# Patient Record
Sex: Male | Born: 1980 | Race: White | Hispanic: No | Smoking: Current every day smoker
Health system: Southern US, Community
[De-identification: ages and names within clinical notes are randomized; demographics above are authoritative.]

## PROBLEM LIST (undated history)

## (undated) DIAGNOSIS — K5792 Diverticulitis of intestine, part unspecified, without perforation or abscess without bleeding: Secondary | ICD-10-CM

## (undated) DIAGNOSIS — F1910 Other psychoactive substance abuse, uncomplicated: Secondary | ICD-10-CM

## (undated) DIAGNOSIS — B1920 Unspecified viral hepatitis C without hepatic coma: Secondary | ICD-10-CM

## (undated) DIAGNOSIS — K529 Noninfective gastroenteritis and colitis, unspecified: Secondary | ICD-10-CM

## (undated) DIAGNOSIS — F17200 Nicotine dependence, unspecified, uncomplicated: Secondary | ICD-10-CM

## (undated) DIAGNOSIS — F329 Major depressive disorder, single episode, unspecified: Secondary | ICD-10-CM

## (undated) DIAGNOSIS — Z22322 Carrier or suspected carrier of Methicillin resistant Staphylococcus aureus: Secondary | ICD-10-CM

## (undated) DIAGNOSIS — F419 Anxiety disorder, unspecified: Secondary | ICD-10-CM

## (undated) DIAGNOSIS — F32A Depression, unspecified: Secondary | ICD-10-CM

## (undated) DIAGNOSIS — Z221 Carrier of other intestinal infectious diseases: Secondary | ICD-10-CM

## (undated) HISTORY — PX: COLONOSCOPY: SHX174

---

## 2011-06-18 ENCOUNTER — Emergency Department (HOSPITAL_COMMUNITY)
Admission: EM | Admit: 2011-06-18 | Discharge: 2011-06-18 | Disposition: A | Discharge status: Home / Self Care | Payer: Self-pay | Attending: Emergency Medicine | Admitting: Emergency Medicine

## 2011-06-18 DIAGNOSIS — K5289 Other specified noninfective gastroenteritis and colitis: Secondary | ICD-10-CM | POA: Insufficient documentation

## 2011-06-18 DIAGNOSIS — R1032 Left lower quadrant pain: Secondary | ICD-10-CM | POA: Insufficient documentation

## 2011-06-18 DIAGNOSIS — Z8619 Personal history of other infectious and parasitic diseases: Secondary | ICD-10-CM | POA: Insufficient documentation

## 2011-06-18 LAB — DIFFERENTIAL
Basophils Absolute: 0.1 10*3/uL (ref 0.0–0.1)
Lymphocytes Relative: 24 % (ref 12–46)
Monocytes Absolute: 0.6 10*3/uL (ref 0.1–1.0)
Monocytes Relative: 7 % (ref 3–12)
Neutro Abs: 6 10*3/uL (ref 1.7–7.7)
Neutrophils Relative %: 64 % (ref 43–77)

## 2011-06-18 LAB — CBC
HCT: 40.4 % (ref 39.0–52.0)
Hemoglobin: 14.1 g/dL (ref 13.0–17.0)
MCHC: 34.9 g/dL (ref 30.0–36.0)
RBC: 4.71 MIL/uL (ref 4.22–5.81)

## 2011-06-18 LAB — BASIC METABOLIC PANEL
CO2: 28 mEq/L (ref 19–32)
Calcium: 9.2 mg/dL (ref 8.4–10.5)
Chloride: 104 mEq/L (ref 96–112)
Glucose, Bld: 97 mg/dL (ref 70–99)
Sodium: 139 mEq/L (ref 135–145)

## 2011-06-28 ENCOUNTER — Emergency Department: Payer: Self-pay | Admitting: Emergency Medicine

## 2011-07-03 ENCOUNTER — Emergency Department (HOSPITAL_COMMUNITY): Payer: Self-pay

## 2011-07-03 ENCOUNTER — Emergency Department (HOSPITAL_COMMUNITY)
Admission: EM | Admit: 2011-07-03 | Discharge: 2011-07-03 | Disposition: A | Discharge status: Home / Self Care | Payer: Self-pay | Attending: Emergency Medicine | Admitting: Emergency Medicine

## 2011-07-03 DIAGNOSIS — K921 Melena: Secondary | ICD-10-CM | POA: Insufficient documentation

## 2011-07-03 DIAGNOSIS — K5289 Other specified noninfective gastroenteritis and colitis: Secondary | ICD-10-CM | POA: Insufficient documentation

## 2011-07-03 DIAGNOSIS — R112 Nausea with vomiting, unspecified: Secondary | ICD-10-CM | POA: Insufficient documentation

## 2011-07-03 DIAGNOSIS — R197 Diarrhea, unspecified: Secondary | ICD-10-CM | POA: Insufficient documentation

## 2011-07-03 DIAGNOSIS — R10814 Left lower quadrant abdominal tenderness: Secondary | ICD-10-CM | POA: Insufficient documentation

## 2011-07-03 DIAGNOSIS — Z86718 Personal history of other venous thrombosis and embolism: Secondary | ICD-10-CM | POA: Insufficient documentation

## 2011-07-03 DIAGNOSIS — R1032 Left lower quadrant pain: Secondary | ICD-10-CM | POA: Insufficient documentation

## 2011-07-03 DIAGNOSIS — R509 Fever, unspecified: Secondary | ICD-10-CM | POA: Insufficient documentation

## 2011-07-03 LAB — CBC
HCT: 41.6 % (ref 39.0–52.0)
Hemoglobin: 15.1 g/dL (ref 13.0–17.0)
MCH: 31.6 pg (ref 26.0–34.0)
MCHC: 36.3 g/dL — ABNORMAL HIGH (ref 30.0–36.0)
MCV: 87 fL (ref 78.0–100.0)
RBC: 4.78 MIL/uL (ref 4.22–5.81)

## 2011-07-03 LAB — BASIC METABOLIC PANEL
BUN: 10 mg/dL (ref 6–23)
CO2: 26 mEq/L (ref 19–32)
Calcium: 9.4 mg/dL (ref 8.4–10.5)
Creatinine, Ser: 0.62 mg/dL (ref 0.50–1.35)
GFR calc non Af Amer: >60 mL/min (ref >60)
Glucose, Bld: 86 mg/dL (ref 70–99)

## 2011-07-03 LAB — URINALYSIS, ROUTINE W REFLEX MICROSCOPIC
Bilirubin Urine: NEGATIVE
Glucose, UA: NEGATIVE mg/dL
Hgb urine dipstick: NEGATIVE
Protein, ur: NEGATIVE mg/dL
Urobilinogen, UA: 0.2 mg/dL (ref 0.0–1.0)

## 2011-07-03 LAB — DIFFERENTIAL
Basophils Relative: 0 % (ref 0–1)
Lymphocytes Relative: 24 % (ref 12–46)
Lymphs Abs: 2.3 10*3/uL (ref 0.7–4.0)
Monocytes Absolute: 0.8 10*3/uL (ref 0.1–1.0)
Monocytes Relative: 9 % (ref 3–12)
Neutro Abs: 6.4 10*3/uL (ref 1.7–7.7)
Neutrophils Relative %: 65 % (ref 43–77)

## 2011-07-05 ENCOUNTER — Inpatient Hospital Stay (HOSPITAL_COMMUNITY)
Admission: EM | Admit: 2011-07-05 | Discharge: 2011-07-09 | DRG: 373 | Disposition: A | Discharge status: Home / Self Care | Payer: Self-pay | Attending: Internal Medicine | Admitting: Internal Medicine

## 2011-07-05 DIAGNOSIS — Z8619 Personal history of other infectious and parasitic diseases: Secondary | ICD-10-CM

## 2011-07-05 DIAGNOSIS — A0472 Enterocolitis due to Clostridium difficile, not specified as recurrent: Principal | ICD-10-CM | POA: Diagnosis present

## 2011-07-05 DIAGNOSIS — F17200 Nicotine dependence, unspecified, uncomplicated: Secondary | ICD-10-CM | POA: Diagnosis present

## 2011-07-05 DIAGNOSIS — R112 Nausea with vomiting, unspecified: Secondary | ICD-10-CM | POA: Diagnosis present

## 2011-07-05 LAB — DIFFERENTIAL
Basophils Relative: 1 % (ref 0–1)
Eosinophils Absolute: 0.3 10*3/uL (ref 0.0–0.7)
Lymphs Abs: 2 10*3/uL (ref 0.7–4.0)
Monocytes Absolute: 0.4 10*3/uL (ref 0.1–1.0)
Monocytes Relative: 7 % (ref 3–12)
Neutrophils Relative %: 58 % (ref 43–77)

## 2011-07-05 LAB — COMPREHENSIVE METABOLIC PANEL
CO2: 26 mEq/L (ref 19–32)
Calcium: 9.4 mg/dL (ref 8.4–10.5)
Creatinine, Ser: 0.63 mg/dL (ref 0.50–1.35)
GFR calc Af Amer: >60 mL/min (ref >60)
GFR calc non Af Amer: >60 mL/min (ref >60)
Glucose, Bld: 97 mg/dL (ref 70–99)
Total Protein: 6.1 g/dL (ref 6.0–8.3)

## 2011-07-05 LAB — CBC
Hemoglobin: 13.4 g/dL (ref 13.0–17.0)
MCH: 30.1 pg (ref 26.0–34.0)
MCHC: 33.9 g/dL (ref 30.0–36.0)
MCV: 88.8 fL (ref 78.0–100.0)
Platelets: 229 10*3/uL (ref 150–400)
RBC: 4.45 MIL/uL (ref 4.22–5.81)

## 2011-07-05 LAB — LIPASE, BLOOD: Lipase: 19 U/L (ref 11–59)

## 2011-07-06 LAB — CBC
HCT: 38.9 % — ABNORMAL LOW (ref 39.0–52.0)
MCHC: 32.9 g/dL (ref 30.0–36.0)
Platelets: 212 10*3/uL (ref 150–400)
RDW: 13.4 % (ref 11.5–15.5)

## 2011-07-06 LAB — BASIC METABOLIC PANEL
Calcium: 8.7 mg/dL (ref 8.4–10.5)
Creatinine, Ser: 0.79 mg/dL (ref 0.50–1.35)
GFR calc Af Amer: >60 mL/min (ref >60)
GFR calc non Af Amer: >60 mL/min (ref >60)
Sodium: 137 mEq/L (ref 135–145)

## 2011-07-06 LAB — CLOSTRIDIUM DIFFICILE BY PCR: Toxigenic C. Difficile by PCR: POSITIVE — AB

## 2011-07-06 LAB — HEMOGLOBIN AND HEMATOCRIT, BLOOD
HCT: 36.1 % — ABNORMAL LOW (ref 39.0–52.0)
Hemoglobin: 12.4 g/dL — ABNORMAL LOW (ref 13.0–17.0)

## 2011-07-06 LAB — SEDIMENTATION RATE: Sed Rate: 1 mm/hr (ref 0–16)

## 2011-07-07 LAB — CBC
MCH: 30.3 pg (ref 26.0–34.0)
Platelets: 202 10*3/uL (ref 150–400)
RBC: 4.22 MIL/uL (ref 4.22–5.81)

## 2011-07-07 LAB — DIFFERENTIAL
Basophils Absolute: 0 10*3/uL (ref 0.0–0.1)
Basophils Relative: 1 % (ref 0–1)
Eosinophils Absolute: 0.3 10*3/uL (ref 0.0–0.7)
Monocytes Relative: 9 % (ref 3–12)
Neutrophils Relative %: 58 % (ref 43–77)

## 2011-07-07 LAB — BASIC METABOLIC PANEL
CO2: 31 mEq/L (ref 19–32)
Calcium: 9 mg/dL (ref 8.4–10.5)
GFR calc non Af Amer: >60 mL/min (ref >60)
Sodium: 140 mEq/L (ref 135–145)

## 2011-07-08 ENCOUNTER — Inpatient Hospital Stay (HOSPITAL_COMMUNITY): Payer: Self-pay

## 2011-07-09 LAB — BASIC METABOLIC PANEL
BUN: 6 mg/dL (ref 6–23)
BUN: 8 mg/dL (ref 6–23)
CO2: 34 mEq/L — ABNORMAL HIGH (ref 19–32)
Calcium: 9.6 mg/dL (ref 8.4–10.5)
Calcium: 9.1 mg/dL (ref 8.4–10.5)
Chloride: 102 mEq/L (ref 96–112)
Creatinine, Ser: 0.8 mg/dL (ref 0.50–1.35)
GFR calc non Af Amer: >60 mL/min (ref >60)
Glucose, Bld: 87 mg/dL (ref 70–99)
Sodium: 139 mEq/L (ref 135–145)

## 2011-07-10 LAB — STOOL CULTURE

## 2011-07-13 ENCOUNTER — Inpatient Hospital Stay (HOSPITAL_COMMUNITY)
Admission: EM | Admit: 2011-07-13 | Discharge: 2011-07-16 | DRG: 373 | Disposition: A | Discharge status: Home / Self Care | Payer: Self-pay | Attending: Family Medicine | Admitting: Family Medicine

## 2011-07-13 DIAGNOSIS — B1920 Unspecified viral hepatitis C without hepatic coma: Secondary | ICD-10-CM | POA: Diagnosis present

## 2011-07-13 DIAGNOSIS — R1032 Left lower quadrant pain: Secondary | ICD-10-CM

## 2011-07-13 DIAGNOSIS — K644 Residual hemorrhoidal skin tags: Secondary | ICD-10-CM | POA: Diagnosis present

## 2011-07-13 DIAGNOSIS — K5730 Diverticulosis of large intestine without perforation or abscess without bleeding: Secondary | ICD-10-CM | POA: Diagnosis present

## 2011-07-13 DIAGNOSIS — F1910 Other psychoactive substance abuse, uncomplicated: Secondary | ICD-10-CM | POA: Diagnosis present

## 2011-07-13 DIAGNOSIS — F1210 Cannabis abuse, uncomplicated: Secondary | ICD-10-CM | POA: Diagnosis present

## 2011-07-13 DIAGNOSIS — R197 Diarrhea, unspecified: Secondary | ICD-10-CM

## 2011-07-13 DIAGNOSIS — A0472 Enterocolitis due to Clostridium difficile, not specified as recurrent: Principal | ICD-10-CM | POA: Diagnosis present

## 2011-07-13 DIAGNOSIS — B182 Chronic viral hepatitis C: Secondary | ICD-10-CM

## 2011-07-13 DIAGNOSIS — K029 Dental caries, unspecified: Secondary | ICD-10-CM | POA: Diagnosis present

## 2011-07-13 DIAGNOSIS — F1110 Opioid abuse, uncomplicated: Secondary | ICD-10-CM | POA: Diagnosis present

## 2011-07-13 DIAGNOSIS — F17200 Nicotine dependence, unspecified, uncomplicated: Secondary | ICD-10-CM | POA: Diagnosis present

## 2011-07-13 DIAGNOSIS — K5900 Constipation, unspecified: Secondary | ICD-10-CM | POA: Diagnosis present

## 2011-07-13 DIAGNOSIS — R933 Abnormal findings on diagnostic imaging of other parts of digestive tract: Secondary | ICD-10-CM

## 2011-07-13 DIAGNOSIS — Z765 Malingerer [conscious simulation]: Secondary | ICD-10-CM

## 2011-07-13 LAB — COMPREHENSIVE METABOLIC PANEL
ALT: 49 U/L (ref 0–53)
AST: 28 U/L (ref 0–37)
Albumin: 3.8 g/dL (ref 3.5–5.2)
Albumin: 3.1 g/dL — ABNORMAL LOW (ref 3.5–5.2)
Alkaline Phosphatase: 79 U/L (ref 39–117)
CO2: 26 mEq/L (ref 19–32)
Calcium: 9.6 mg/dL (ref 8.4–10.5)
Creatinine, Ser: 0.66 mg/dL (ref 0.50–1.35)
GFR calc Af Amer: >60 mL/min (ref >60)
GFR calc non Af Amer: >60 mL/min (ref >60)
Glucose, Bld: 102 mg/dL — ABNORMAL HIGH (ref 70–99)
Potassium: 3.3 mEq/L — ABNORMAL LOW (ref 3.5–5.1)
Sodium: 140 mEq/L (ref 135–145)
Total Protein: 6.9 g/dL (ref 6.0–8.3)
Total Protein: 5.7 g/dL — ABNORMAL LOW (ref 6.0–8.3)

## 2011-07-13 LAB — URINALYSIS, ROUTINE W REFLEX MICROSCOPIC
Hgb urine dipstick: NEGATIVE
Specific Gravity, Urine: 1.027 (ref 1.005–1.030)
Urobilinogen, UA: 0.2 mg/dL (ref 0.0–1.0)

## 2011-07-13 LAB — CBC
MCH: 31.4 pg (ref 26.0–34.0)
MCHC: 36 g/dL (ref 30.0–36.0)
MCV: 87.3 fL (ref 78.0–100.0)
Platelets: 265 10*3/uL (ref 150–400)
RDW: 13 % (ref 11.5–15.5)

## 2011-07-13 LAB — DIFFERENTIAL
Eosinophils Absolute: 0.2 10*3/uL (ref 0.0–0.7)
Eosinophils Relative: 2 % (ref 0–5)
Lymphs Abs: 1.6 10*3/uL (ref 0.7–4.0)
Monocytes Absolute: 0.5 10*3/uL (ref 0.1–1.0)
Monocytes Relative: 5 % (ref 3–12)

## 2011-07-14 ENCOUNTER — Other Ambulatory Visit: Payer: Self-pay | Admitting: Internal Medicine

## 2011-07-14 DIAGNOSIS — K921 Melena: Secondary | ICD-10-CM

## 2011-07-14 DIAGNOSIS — K5730 Diverticulosis of large intestine without perforation or abscess without bleeding: Secondary | ICD-10-CM

## 2011-07-14 LAB — CBC
HCT: 37 % — ABNORMAL LOW (ref 39.0–52.0)
Hemoglobin: 13 g/dL (ref 13.0–17.0)
MCH: 30.9 pg (ref 26.0–34.0)
MCHC: 35.1 g/dL (ref 30.0–36.0)

## 2011-07-14 LAB — COMPREHENSIVE METABOLIC PANEL
Alkaline Phosphatase: 79 U/L (ref 39–117)
BUN: 12 mg/dL (ref 6–23)
Calcium: 8.7 mg/dL (ref 8.4–10.5)
GFR calc Af Amer: >60 mL/min (ref >60)
Glucose, Bld: 102 mg/dL — ABNORMAL HIGH (ref 70–99)
Total Protein: 5.7 g/dL — ABNORMAL LOW (ref 6.0–8.3)

## 2011-07-14 LAB — TSH: TSH: 0.395 u[IU]/mL (ref 0.350–4.500)

## 2011-07-14 LAB — SEDIMENTATION RATE: Sed Rate: 1 mm/hr (ref 0–16)

## 2011-07-14 LAB — DRUGS OF ABUSE SCREEN W/O ALC, ROUTINE URINE
Amphetamine Screen, Ur: NEGATIVE
Barbiturate Quant, Ur: NEGATIVE
Cocaine Metabolites: NEGATIVE
Creatinine,U: 244.1 mg/dL
Propoxyphene: NEGATIVE

## 2011-07-14 LAB — URINALYSIS, DIPSTICK ONLY
Bilirubin Urine: NEGATIVE
Glucose, UA: NEGATIVE mg/dL
Hgb urine dipstick: NEGATIVE
Ketones, ur: NEGATIVE mg/dL
pH: 6 (ref 5.0–8.0)

## 2011-07-14 NOTE — Discharge Summary (Signed)
[name], [name]               ACCOUNT NO.:  [id number]  MEDICAL RECORD NO.:  [id number]  LOCATION:  1506                         FACILITY:  [facility]  PHYSICIAN:  Kela Millin, M.D.DATE OF BIRTH:  [birth year]  DATE OF ADMISSION:  07/05/2011 DATE OF DISCHARGE:  07/09/2011                        DISCHARGE SUMMARY - REFERRING   DISCHARGE DIAGNOSES: 1. Clostridium difficile colitis. 2. History of hepatitis C. 3. History of diverticulosis with diverticulitis in the past.  PROCEDURES AND STUDIES: 1. CT scan of the abdomen and pelvis on July 03, 2011 - mild mural     thickening of the sigmoid colon suggesting mild colitis.     Diverticulosis without diverticulitis.  Inflammatory versus     infectious.  Remainder of the colon appears normal. 2. Chest x-ray on July 08, 2011 - no significant abnormality     identified.  BRIEF HISTORY:  The patient is a 30 year old white male with above- listed medical problems, who presented with bloody diarrhea.  He reported that about 2 years ago he had had a colonoscopy in [place] and was diagnosed with diverticulosis and he did not have an episode of diverticulitis.  He stated that he had had bloody diarrhea for 3 to 4 days.  He admitted to generalized malaise and decreased appetite and admitted to abdominal cramping as well.  He reported that he had recently been started on Cipro and Flagyl because of the symptoms he was having.  In the ED, lab work included white cell count which was normal at 6.4.  His lipase was normal at 19, and he had had a CT scan of his abdomen and pelvis done on July 03, 2011 when he came to the ED and is revealed diverticulosis without diverticulitis, and mild colitis was noted.  He was admitted for further evaluation and management.  HOSPITAL COURSE: 1. Clostridium difficile colitis - upon admission, patient was     empirically started on Cipro and Flagyl.  Stool studies were     obtained and the PCR for C diff  came back positive.  The patient     was changed to oral Flagyl.  His pain gradually improved, and he     was started on a diet which was advanced as tolerated.  On his     initial attempt at taking solids, the patient had an episode of     increased pain and had some shortness of breath with that at that     time and his chest x-ray was done, which did not show any acute     infiltrates.  He also had a recurrence of his nausea and vomiting     then.  Following that, his diet was downgraded.  He has not had any     further bloody diarrhea, although he reported some loose stools     today - nonbloody.  He has been tolerating solids - low-residue     diet today with no further nausea or vomiting.  He will be     discharged on oral antibiotics at this time, and he is to follow up     outpatient.  He has remained afebrile, hemodynamically stable with  no leukocytosis.  Case management is assisting the patient with his     discharge medications. 2. His other chronic medical conditions remained stable during this     hospital stay.  DISCHARGE MEDICATIONS: 1. Flora-Q 1 capsule p.o. b.i.d. 2. Questran 1 packet p.o. daily p.r.n. 3. Flagyl 500 mg one p.o. q.8 h for 10 more days. 4. Vicodin 5/325 mg q.6 h p.r.n. 5. Phenergan 25 mg q.6 h p.r.n. as previously.  DISCONTINUED MEDICATIONS:  Cipro.  FOLLOWUP CARE:  Primary care physician - to be set up per case management in 1 to 2 weeks.  DISCHARGE CONDITION:  Improved/stable.     Kela Millin, M.D.     ACV/MEDQ  D:  07/09/2011  T:  07/09/2011  Job:  [id number]  Electronically Signed by Donnalee Curry M.D. on 07/14/2011 09:55:12 PM

## 2011-07-15 ENCOUNTER — Inpatient Hospital Stay (HOSPITAL_COMMUNITY): Payer: Self-pay

## 2011-07-15 DIAGNOSIS — R1032 Left lower quadrant pain: Secondary | ICD-10-CM

## 2011-07-15 DIAGNOSIS — R197 Diarrhea, unspecified: Secondary | ICD-10-CM

## 2011-07-15 DIAGNOSIS — R933 Abnormal findings on diagnostic imaging of other parts of digestive tract: Secondary | ICD-10-CM

## 2011-07-15 DIAGNOSIS — B182 Chronic viral hepatitis C: Secondary | ICD-10-CM

## 2011-07-16 LAB — HEPATITIS C VRS RNA DETECT BY PCR-QUAL: Hepatitis C Vrs RNA by PCR-Qual: POSITIVE — AB

## 2011-07-16 LAB — HCV RNA QUANT RFLX ULTRA OR GENOTYP: HCV Quantitative: 254000 IU/mL — ABNORMAL HIGH (ref <43)

## 2011-07-16 NOTE — Discharge Summary (Signed)
[name], [name]               ACCOUNT NO.:  [id number]  MEDICAL RECORD NO.:  [id number]  LOCATION:  5532                         FACILITY:  [facility]  PHYSICIAN:  Standley Dakins, MD   DATE OF BIRTH:  1981/08/27  DATE OF ADMISSION:  07/13/2011 DATE OF DISCHARGE:  07/16/2011                              DISCHARGE SUMMARY   PRIMARY CARE PHYSICIAN:  Dr. Andrey Campanile as [facility].  DISCHARGE DIAGNOSES: 1. Diverticulosis, without diverticulitis. 2. Clostridium difficile colitis. 3. Chronic constipation. 4. Chronic active nicotine dependence. 5. Urine drug screen positive for marijuana and opioid use. 6. Drug-seeking behaviors. 7. Status post flexible sigmoidoscopy. 8. Hepatitis C. 9. Nonspecific abdominal pain. 10.Significant Dental caries. 11.Self-neglect.   DISCHARGE MEDICATIONS: 1. Dicyclomine 10 mg 1 capsule by mouth 3 times daily as needed for     stomach cramps and spasms. 2. Omeprazole 20 mg one p.o. daily. 3. Metronidazole 500 mg 1 tablet p.o. every 8 hours for an additional     7 days. 4. Flora-Q intestinal capsules probiotics 1 p.o. twice daily. 5. Hydrocodone/acetaminophen 5 mg 1 tablet by mouth every 6 hours     p.r.n. severe cramping pain only 7-day supply.  No refills.  HOSPITAL COURSE:  Briefly, this patient is a 30 year old gentleman who presented to the emergency department complaining of persistent abdominal cramping, nausea, vomiting, and bloody diarrhea.  He had recently been discharged several days prior to this admission from [facility] with a similar complaint and discovered to have C. diff colitis and started on oral metronidazole.  He was discharged on oral metronidazole.  The patient was admitted into the hospital and was seen in consultation by Dr. Juanda Chance, our gastroenterologist.  She performed a flexible sigmoidoscopy on the patient and found no significant abnormalities other than an external hemorrhoid.  She did take some biopsies and  the biopsies were negative for morphologic features of idiopathic inflammatory bowel disease, microscopic colitis, or infectious colitis, and negative for dysplasia.  The patient also had a KUB to rule out ileus and that test was negative as well.  C-reactive protein, sed rate, and TSH studies were negative.  Stools cultures also negative.  The patient did have a positive C. diff test in the hospital.  All the other tests have been negative.  The gastroenterologist recommended that the patient be discharged with outpatient followup and to complete oral Flagyl for a full 10 days.  Also of note, the patient had marijuana on the drug screen in the urine and also the patient had positive opioids.  The patient did have some drug-seeking behaviors that was noted during this hospitalization.  He seemed to be requesting narcotic medications regularly and trying to manipulate the nurses for higher doses of narcotic medications.  When all of his tests came back negative and no significant abnormalities found, I discharged him to follow up as an outpatient and to complete his course of metronidazole. There was no evidence of inflammatory bowel disease discovered on this hospitalization and from the biopsies.  There was no evidence of C. diff colitis on the flex sigmoidoscopy, however he did have a positive stools test for C. diff.  Therefore, I would like for him  to continue the metronidazole until he has completed a full 10 days.  I have recommended that he have a high-fiber diet as well.  DISCHARGE CONDITION:  Stable.  Symptoms resolved.  DISPOSITION:  Discharged home with family.  ACTIVITY:  Ad lib.  DIET:  High-fiber diet recommended.  FOLLOWUP: 1. Follow up with [facility] on July 27, 2011 for an enrollment and     August 09, 2011 for an appointment with Dr. Andrey Campanile as scheduled. 2. I recommend that the patient follow up with a dentist as soon as     possible.  SPECIAL  INSTRUCTIONS: 1. I am recommending that the patient stop tobacco products and stop     using marijuana and recreational drugs. 2. Please follow a strict high-fiber diet and please take probiotics     regularly and complete antibiotics as prescribed.  This is very     important. 3. If the patient continues to have symptoms, I have recommended that     he make an appointment to see his primary care provider to have an     evaluation.  The patient verbalized understanding.  I evaluated the patient on the day of discharge.  He was evaluated on July 16, 2011.  PHYSICAL EXAMINATION:  GENERAL:  The patient was in bed, alert, in no distress, cooperative, and appropriate. VITAL SIGNS:  Reviewed.  Temperature 98.2, pulse 68, respirations 18, blood pressure 111/73, pulse ox 98% on room air.  GENERAL:  Well- developed, well-nourished male, awake, alert, in no distress. HEENT:  Normocephalic, atraumatic.  Mucous membranes moist.  Poor dentition. LUNGS:  Bilateral breath sounds clear to auscultation. CARDIAC:  Normal S1,S2 sounds without murmurs, rubs, or gallops. ABDOMEN:  Soft, nondistended, nontender, no masses palpated.  Bowel sounds present. EXTREMITIES:  No pretibial edema, cyanosis, or clubbing. NEUROLOGIC:  No focal deficits. SKIN:  No gross lesions noted.  LABORATORY DATA:  Stool cultures negative.  CRP negative.  C. diff test positive.  TSH 0.395.  Sed rate 1.  Sodium 141, potassium 3.8, chloride 107, CO2 of 26, BUN 12, creatinine 0.69, AST 25, ALT 48, calcium 8.7, white blood cell count 6.4, hemoglobin 13.0, hematocrit 37.0, platelet count 233.  IMPRESSION:  The patient is evaluated and determined to be stable for discharge home and followup as above.  I spent around 35 minutes preparing this patient's discharge, reviewing medical records, arranging followup, coordinating care with the case management team, and dictating a discharge summary.     Standley Dakins,  MD     CJ/MEDQ  D:  07/16/2011  T:  07/16/2011  Job:  [id number]  cc:   Dr. Andrey Campanile at [facility]  Electronically Signed by Standley Dakins  on 07/16/2011 09:31:18 PM

## 2011-07-16 NOTE — H&P (Signed)
NAMEMARQUES, [name]               ACCOUNT NO.:  [id number]  MEDICAL RECORD NO.:  [id number]  LOCATION:  [facility]                         FACILITY:  [facility]  PHYSICIAN:  Standley Dakins, MD   DATE OF BIRTH:  24-Oct-1981  DATE OF ADMISSION:  07/13/2011 DATE OF DISCHARGE:                             HISTORY & PHYSICAL   PRIMARY CARE PHYSICIAN:  Dr. Andrey Campanile, [facility].  CHIEF COMPLAINT:  Abdominal pain, nausea, vomiting, and bloody diarrhea.  HISTORY OF PRESENT ILLNESS:  This patient is a 29 year old gentleman, who was recently discharged from [facility] with a diagnosis of C. diff colitis, who apparently had been doing well up until 2 days ago when he reports he woke up feeling some malaise and left lower quadrant abdominal pain that progressively got worse.  The patient reports that he started to develop explosive diarrhea as well.  He reports that he had been taking his metronidazole medication.  He is reporting small amounts of blood in the stool.  The patient reports that a 23-month-old child that lives in his home has also had diarrhea.  The patient says that he had been taking oxycodone when discharged from the hospital because of pain.  The patient says when he was discharged he continued to have left lower quadrant abdominal pain.  The patient says he had not eaten since yesterday.  The patient says he is having significant nausea and vomiting and cannot keep any food or liquid down at this time.    The patient reports that he is having mucous in the stools.  He is having cramping intermittent abdominal pain that is described as 8/10 in severity.  He arrived to the emergency department today and reported that he vomited twice since coming to the emergency department.  He can no longer keep anything down.  The patient has not followed up with primary care provider since being discharged from the hospital.  His appointment had been scheduled for August.   The patient was treated in the emergency department, but no significant improvement in symptoms and hospital admission was requested.  PAST MEDICAL HISTORY: 1. Hepatitis C. 2. Diverticulosis with a history of diverticulitis. 3. Recent diagnosis of C. difficile colitis. 4. Chronic active nicotine dependence.  PAST SURGICAL HISTORY:  The patient reports colonoscopy 2 years ago in [place], [place] where he reports he was diagnosed with diverticulosis.  MEDICATIONS: 1. Questran Light 1 packet daily. 2. Promethazine 25 mg one p.o. every 6 hours p.r.n. nausea. 3. Metronidazole 500 mg one p.o. every 8 hours with instructions to     take for 10 days. 4. Hydrocodone acetaminophen 5/325 one to two tablets p.o. every 6     hours p.r.n. pain. 5. Flora-Q intestinal flora capsules one cap p.o. twice daily.  ALLERGIES:  No known drug allergies.  FAMILY HISTORY:  The patient reports a significant family history of cancer.  His mother had ovarian cancer.  His aunt has terminal lung cancer.  His maternal grandmother has hypertension.  SOCIAL HISTORY:  The patient reports that he is living locally in [place] with roommates.  He reports smoking one pack of cigarettes per day for 11 years.  Denies  alcohol and recreational drug use.  REVIEW OF SYSTEMS:  Significant for all of the positives in the HPI, otherwise all systems reviewed completely and reported as negative.  PHYSICAL EXAMINATION:  CURRENT VITAL SIGNS:  Temperature 98.6 oral, pulse 102, respirations 20, blood pressure 132/85, pulse ox 99% on room air. GENERAL: Well-developed, well-nourished male, thin, lying in the bed. He is in no apparent distress, cooperative, well speaking. HEENT:  Normocephalic, atraumatic.  Dry mucous membranes.  Poor dentition with dental caries seen. NECK:  Supple.  Thyroid soft.  No nodules or masses palpated.  No cervical lymphadenopathy noted. LUNGS:  Bilateral breath sounds, clear to auscultation.   No crackles, wheezes, or rhonchi. CARDIAC:  Normal S1 and S2 sounds without murmurs, rubs, or gallops. ABDOMEN:  Soft and nondistended.  Bowel sounds heard.  Mild epigastric tenderness with palpation and diffuse left lower quadrant tenderness with deep palpation.  Mild guarding noted in left lower quadrant.  No rebound tenderness noted.  No suprapubic tenderness. EXTREMITIES:  No pretibial edema, cyanosis, or clubbing.  The patient has multiple tattoos on the upper extremities, especially the left upper extremity.  LABORATORY DATA:  Urinalysis negative.  Sodium 140, potassium 3.5, chloride 103, CO2 of 26, glucose 131, BUN 11, creatinine 0.66, calcium 9.6, total protein 6.9, albumin 3.8, AST 28, ALT 57, alk phos 92, bilirubin 0.5.  White blood cell count 10.2, hemoglobin 14.8, hematocrit 41.1, platelet count 265.  IMPRESSION: 1. Left lower quadrant abdominal pain. 2. Clostridium difficile colitis. 3. Diverticulosis. 4. Chronic active nicotine dependence. 5. Intractable nausea and vomiting with diarrhea. 6. Bloody stools.  PLAN: 1. Admit the patient to a med/surg bed for IV fluids. 2. Maintain n.p.o. status at this time. 3. Contact precautions for C. diff colitis. 4. IV metronidazole has been ordered. 5. Nicotine patch and smoking cessation encouraged. 6. Repeat CT scan of the abdomen to rule out any significant changes. 7. Nausea medications and pain medications as needed. 8. GI consultation for specialty opinion. 9. Continue to monitor and make adjustments to medical care as     required.     Standley Dakins, MD     CJ/MEDQ  D:  07/13/2011  T:  07/13/2011  Job:  [id number]  Electronically Signed by Standley Dakins  on 07/16/2011 09:29:08 PM

## 2011-07-17 ENCOUNTER — Encounter: Payer: Self-pay | Admitting: Internal Medicine

## 2011-07-18 LAB — STOOL CULTURE

## 2011-07-19 LAB — OPIATE, QUANTITATIVE, URINE
Codeine Urine: NEGATIVE NG/ML
Morphine, Confirm: 3291 NG/ML — ABNORMAL HIGH
Oxycodone, ur: NEGATIVE NG/ML

## 2011-07-21 NOTE — H&P (Signed)
NAMEBRINTON, [name]               ACCOUNT NO.:  [id number]  MEDICAL RECORD NO.:  [id number]  LOCATION:  [facility]                         FACILITY:  [facility]  PHYSICIAN:  Houston Siren, MD           DATE OF BIRTH:  1981-04-11  DATE OF ADMISSION:  07/05/2011 DATE OF DISCHARGE:                             HISTORY & PHYSICAL   PRIMARY CARE PHYSICIAN:  None.  ADVANCE DIRECTIVE:  Full code.  REASON FOR ADMISSION:  Bloody diarrhea.  HISTORY OF PRESENT ILLNESS:  This is a 30 year old white male with history of diverticulosis and diverticulitis diagnosed by colonoscopy approximately 2 years ago in [place], history of hepatitis C, presumably from sexually transmitted disease versus tattooing, presents to [facility] Emergency Room with 3 to 4 days' history of bloody stool.  He also has been feeling malaise and loss of his appetite.  He has some abdominal cramps as well.  He is otherwise healthy and on no chronic medication except for the Cipro and Flagyl given to him recently because he was ill.  There has been no family history of inflammatory bowel disease.  Evaluation in the emergency room showed normal white count of 6400, hemoglobin of 13.4 and platelet count of 229,000.  A few days ago when he was seen in the emergency room, his hemoglobin was about 15 g/dL.  His lipase is normal at 19 and his kidney function is normal with creatinine of 0.63.  He has a potassium of 4.2 and normal liver function tests.  A CT scan done 2 days ago showed diverticulosis and possibly colitis as well.  Hospitalist was asked to admit him because of intractable pain, persistent bloody stool for further evaluation and treatment.  PAST MEDICAL HISTORY:  Diverticulosis, diverticulitis, and hepatitis C.  SOCIAL HISTORY:  He is separated, with no children.  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  He is a smoker but denied alcohol or illicit drug use.  CURRENT MEDICATIONS:  Cipro, Flagyl, and  hydrocodone.  ALLERGIES:  No known drug allergies.  FAMILY HISTORY:  No family history of inflammatory bowel disease.  REVIEW OF SYSTEMS:  Otherwise unremarkable.  PHYSICAL EXAMINATION:  VITAL SIGNS:  Physical exam shows blood pressure 145/87, pulse of 66, respiratory rate of 18, temperature 98.7. GENERAL:  Exam showed that he is alert and oriented and is in no apparent distress.  He is mentating well.  He has facial symmetry and fluent speech.  Tongue is midline.  Sclerae nonicteric. HEENT:  Pupils equal, round, reactive to light and accommodation. NECK:  Supple.  No stridor. CARDIAC EXAM:  Revealed S1, S2 regular.  I did not hear any murmur, rub or gallop. LUNGS:  Clear.  No wheezes, rales or any evidence of consolidation. ABDOMINAL EXAM:  Slightly tender over the left lower quadrant and mid epigastric area with no rebound.  Bowel sounds present.  No palpable masses.  He has no peripheral stigmata of chronic liver disease. EXTREMITIES:  Show no edema.  He has no calf tenderness. SKIN:  Warm and dry. NEUROLOGICAL EXAM:  Unremarkable. PSYCHIATRIC EXAM:  Unremarkable, as well. He does have tattoo on his left arm and  elsewhere on his body as well.  OBJECTIVE FINDINGS:  Serum sodium 137, potassium 4.2, creatinine 0.63, blood glucose of 97.  Liver function tests are normal.  White count of 6400, hemoglobin of 13.4, platelet count of 229,000.  Lipase is 19. Abdominal CT scan done 4 days prior showed no obstruction, presence of diverticulosis and possible colitis.  IMPRESSION:  This is a 30 year old male with history of prior diverticulitis, presents with abdominal pain and bloody diarrhea with CT scan showed diverticulosis and possible colitis.  He maintained hemodynamic stability with a good hemoglobin of 13.4.  Because he failed outpatient therapy and is unable to take any p.o. medication, we will admit him and give him bowel rest.  I will give him intravenous fluid with potassium  supplement.  We will give Dilaudid for pain medication.  He is a smoker and wishes to stop smoking, and thus nicotine patch will be given as well.  For his hepatitis C, he should reconsider taking the treatment.  We will see whether or not he has hepatitis B series because if not, he definitely will need to have that done as well.  He is stable, full code and will be admitted [facility] 5.     Houston Siren, MD     PL/MEDQ  D:  07/05/2011  T:  07/05/2011  Job:  [id number]  Electronically Signed by Houston Siren  on 07/21/2011 03:48:05 AM

## 2011-08-01 ENCOUNTER — Emergency Department (HOSPITAL_COMMUNITY)
Admission: EM | Admit: 2011-08-01 | Discharge: 2011-08-01 | Disposition: A | Discharge status: Home / Self Care | Payer: Self-pay | Attending: Emergency Medicine | Admitting: Emergency Medicine

## 2011-08-01 ENCOUNTER — Emergency Department (HOSPITAL_COMMUNITY): Payer: Self-pay

## 2011-08-01 ENCOUNTER — Encounter (HOSPITAL_COMMUNITY): Payer: Self-pay

## 2011-08-01 DIAGNOSIS — R197 Diarrhea, unspecified: Secondary | ICD-10-CM | POA: Insufficient documentation

## 2011-08-01 DIAGNOSIS — R112 Nausea with vomiting, unspecified: Secondary | ICD-10-CM | POA: Insufficient documentation

## 2011-08-01 DIAGNOSIS — R1032 Left lower quadrant pain: Secondary | ICD-10-CM | POA: Insufficient documentation

## 2011-08-01 LAB — URINE MICROSCOPIC-ADD ON

## 2011-08-01 LAB — COMPREHENSIVE METABOLIC PANEL
Albumin: 3.5 g/dL (ref 3.5–5.2)
BUN: 9 mg/dL (ref 6–23)
Calcium: 9.3 mg/dL (ref 8.4–10.5)
Chloride: 105 mEq/L (ref 96–112)
Creatinine, Ser: 0.6 mg/dL (ref 0.50–1.35)
Total Bilirubin: 0.3 mg/dL (ref 0.3–1.2)
Total Protein: 6.3 g/dL (ref 6.0–8.3)

## 2011-08-01 LAB — URINALYSIS, ROUTINE W REFLEX MICROSCOPIC
Hgb urine dipstick: NEGATIVE
Nitrite: NEGATIVE
Specific Gravity, Urine: 1.016 (ref 1.005–1.030)
Urobilinogen, UA: 0.2 mg/dL (ref 0.0–1.0)
pH: 7.5 (ref 5.0–8.0)

## 2011-08-01 LAB — OCCULT BLOOD, POC DEVICE: Fecal Occult Bld: NEGATIVE

## 2011-08-01 LAB — CBC
HCT: 36.8 % — ABNORMAL LOW (ref 39.0–52.0)
MCH: 30.8 pg (ref 26.0–34.0)
MCHC: 35.9 g/dL (ref 30.0–36.0)
MCV: 86 fL (ref 78.0–100.0)
RDW: 12.6 % (ref 11.5–15.5)

## 2011-08-01 LAB — DIFFERENTIAL
Eosinophils Relative: 3 % (ref 0–5)
Lymphocytes Relative: 21 % (ref 12–46)
Lymphs Abs: 1.7 10*3/uL (ref 0.7–4.0)
Monocytes Absolute: 0.5 10*3/uL (ref 0.1–1.0)
Monocytes Relative: 6 % (ref 3–12)

## 2011-08-01 LAB — LIPASE, BLOOD: Lipase: 18 U/L (ref 11–59)

## 2011-08-01 MED ORDER — IOHEXOL 300 MG/ML  SOLN
100.0000 mL | Freq: Once | INTRAMUSCULAR | Status: AC | PRN
  Administered 2011-08-01: 100 mL via INTRAVENOUS

## 2011-08-02 LAB — URINE CULTURE: Culture  Setup Time: 201208090118

## 2011-08-13 ENCOUNTER — Emergency Department (HOSPITAL_COMMUNITY)
Admission: EM | Admit: 2011-08-13 | Discharge: 2011-08-13 | Disposition: A | Discharge status: Home / Self Care | Payer: Self-pay | Attending: Emergency Medicine | Admitting: Emergency Medicine

## 2011-08-13 DIAGNOSIS — Z8614 Personal history of Methicillin resistant Staphylococcus aureus infection: Secondary | ICD-10-CM | POA: Insufficient documentation

## 2011-08-13 DIAGNOSIS — X58XXXA Exposure to other specified factors, initial encounter: Secondary | ICD-10-CM | POA: Insufficient documentation

## 2011-08-13 DIAGNOSIS — M79609 Pain in unspecified limb: Secondary | ICD-10-CM | POA: Insufficient documentation

## 2011-08-13 DIAGNOSIS — IMO0002 Reserved for concepts with insufficient information to code with codable children: Secondary | ICD-10-CM | POA: Insufficient documentation

## 2011-08-13 DIAGNOSIS — F17200 Nicotine dependence, unspecified, uncomplicated: Secondary | ICD-10-CM | POA: Insufficient documentation

## 2011-08-13 DIAGNOSIS — Y9389 Activity, other specified: Secondary | ICD-10-CM | POA: Insufficient documentation

## 2011-08-15 ENCOUNTER — Inpatient Hospital Stay (HOSPITAL_COMMUNITY)
Admission: EM | Admit: 2011-08-15 | Discharge: 2011-08-19 | DRG: 392 | Disposition: A | Discharge status: Home / Self Care | Payer: Self-pay | Attending: Family Medicine | Admitting: Family Medicine

## 2011-08-15 ENCOUNTER — Emergency Department (HOSPITAL_COMMUNITY): Payer: Self-pay

## 2011-08-15 DIAGNOSIS — G894 Chronic pain syndrome: Secondary | ICD-10-CM | POA: Diagnosis present

## 2011-08-15 DIAGNOSIS — R109 Unspecified abdominal pain: Secondary | ICD-10-CM | POA: Diagnosis present

## 2011-08-15 DIAGNOSIS — B1920 Unspecified viral hepatitis C without hepatic coma: Secondary | ICD-10-CM | POA: Diagnosis present

## 2011-08-15 DIAGNOSIS — F1910 Other psychoactive substance abuse, uncomplicated: Secondary | ICD-10-CM | POA: Diagnosis present

## 2011-08-15 DIAGNOSIS — F17200 Nicotine dependence, unspecified, uncomplicated: Secondary | ICD-10-CM | POA: Diagnosis present

## 2011-08-15 DIAGNOSIS — L02529 Furuncle unspecified hand: Secondary | ICD-10-CM | POA: Diagnosis present

## 2011-08-15 DIAGNOSIS — K5732 Diverticulitis of large intestine without perforation or abscess without bleeding: Principal | ICD-10-CM | POA: Diagnosis present

## 2011-08-15 LAB — URINALYSIS, ROUTINE W REFLEX MICROSCOPIC
Leukocytes, UA: NEGATIVE
Protein, ur: NEGATIVE mg/dL
Urobilinogen, UA: 0.2 mg/dL (ref 0.0–1.0)

## 2011-08-15 LAB — DIFFERENTIAL
Eosinophils Absolute: 0.4 10*3/uL (ref 0.0–0.7)
Lymphs Abs: 2 10*3/uL (ref 0.7–4.0)
Monocytes Relative: 7 % (ref 3–12)
Neutrophils Relative %: 69 % (ref 43–77)

## 2011-08-15 LAB — COMPREHENSIVE METABOLIC PANEL
CO2: 28 mEq/L (ref 19–32)
Calcium: 9.2 mg/dL (ref 8.4–10.5)
Creatinine, Ser: 0.59 mg/dL (ref 0.50–1.35)
GFR calc Af Amer: >60 mL/min (ref >60)
GFR calc non Af Amer: >60 mL/min (ref >60)
Glucose, Bld: 91 mg/dL (ref 70–99)

## 2011-08-15 LAB — CBC
Hemoglobin: 13.7 g/dL (ref 13.0–17.0)
MCH: 30.4 pg (ref 26.0–34.0)
MCV: 87.4 fL (ref 78.0–100.0)
Platelets: 257 10*3/uL (ref 150–400)
RBC: 4.51 MIL/uL (ref 4.22–5.81)

## 2011-08-15 LAB — RAPID URINE DRUG SCREEN, HOSP PERFORMED
Opiates: POSITIVE — AB
Tetrahydrocannabinol: POSITIVE — AB

## 2011-08-15 LAB — LIPASE, BLOOD: Lipase: 23 U/L (ref 11–59)

## 2011-08-15 MED ORDER — IOHEXOL 300 MG/ML  SOLN
100.0000 mL | Freq: Once | INTRAMUSCULAR | Status: AC | PRN
  Administered 2011-08-15: 100 mL via INTRAVENOUS

## 2011-08-16 LAB — CBC
Hemoglobin: 12 g/dL — ABNORMAL LOW (ref 13.0–17.0)
MCH: 30.8 pg (ref 26.0–34.0)
MCV: 88.2 fL (ref 78.0–100.0)
RBC: 3.9 MIL/uL — ABNORMAL LOW (ref 4.22–5.81)

## 2011-08-16 LAB — BASIC METABOLIC PANEL
CO2: 28 mEq/L (ref 19–32)
Calcium: 8.3 mg/dL — ABNORMAL LOW (ref 8.4–10.5)
Chloride: 105 mEq/L (ref 96–112)
Glucose, Bld: 99 mg/dL (ref 70–99)
Sodium: 137 mEq/L (ref 135–145)

## 2011-08-17 LAB — CBC
HCT: 35.8 % — ABNORMAL LOW (ref 39.0–52.0)
Hemoglobin: 12.2 g/dL — ABNORMAL LOW (ref 13.0–17.0)
MCH: 30.2 pg (ref 26.0–34.0)
MCHC: 34.1 g/dL (ref 30.0–36.0)
MCV: 88.6 fL (ref 78.0–100.0)

## 2011-08-17 LAB — DIFFERENTIAL
Basophils Relative: 1 % (ref 0–1)
Lymphs Abs: 1.7 10*3/uL (ref 0.7–4.0)
Monocytes Absolute: 0.5 10*3/uL (ref 0.1–1.0)
Monocytes Relative: 9 % (ref 3–12)
Neutro Abs: 2.9 10*3/uL (ref 1.7–7.7)

## 2011-08-17 LAB — BASIC METABOLIC PANEL
BUN: 7 mg/dL (ref 6–23)
CO2: 29 mEq/L (ref 19–32)
Calcium: 8.2 mg/dL — ABNORMAL LOW (ref 8.4–10.5)
Creatinine, Ser: 0.61 mg/dL (ref 0.50–1.35)
GFR calc non Af Amer: >60 mL/min (ref >60)
Glucose, Bld: 99 mg/dL (ref 70–99)
Sodium: 139 mEq/L (ref 135–145)

## 2011-08-19 LAB — BASIC METABOLIC PANEL
BUN: 6 mg/dL (ref 6–23)
Chloride: 108 mEq/L (ref 96–112)
Glucose, Bld: 100 mg/dL — ABNORMAL HIGH (ref 70–99)
Potassium: 3.5 mEq/L (ref 3.5–5.1)

## 2011-08-19 LAB — CBC
HCT: 37.1 % — ABNORMAL LOW (ref 39.0–52.0)
Hemoglobin: 12.9 g/dL — ABNORMAL LOW (ref 13.0–17.0)
WBC: 6 10*3/uL (ref 4.0–10.5)

## 2011-08-22 LAB — CULTURE, BLOOD (ROUTINE X 2)
Culture  Setup Time: 201208230336
Culture: NO GROWTH

## 2011-09-09 ENCOUNTER — Inpatient Hospital Stay (HOSPITAL_COMMUNITY)
Admission: EM | Admit: 2011-09-09 | Discharge: 2011-09-16 | DRG: 394 | Disposition: A | Discharge status: Home / Self Care | Payer: Self-pay | Attending: Internal Medicine | Admitting: Internal Medicine

## 2011-09-09 ENCOUNTER — Emergency Department (HOSPITAL_COMMUNITY): Payer: Self-pay

## 2011-09-09 DIAGNOSIS — R109 Unspecified abdominal pain: Secondary | ICD-10-CM

## 2011-09-09 DIAGNOSIS — F1910 Other psychoactive substance abuse, uncomplicated: Secondary | ICD-10-CM | POA: Diagnosis present

## 2011-09-09 DIAGNOSIS — T183XXA Foreign body in small intestine, initial encounter: Principal | ICD-10-CM | POA: Diagnosis present

## 2011-09-09 DIAGNOSIS — T184XXA Foreign body in colon, initial encounter: Secondary | ICD-10-CM

## 2011-09-09 DIAGNOSIS — Z8719 Personal history of other diseases of the digestive system: Secondary | ICD-10-CM

## 2011-09-09 DIAGNOSIS — IMO0002 Reserved for concepts with insufficient information to code with codable children: Secondary | ICD-10-CM | POA: Diagnosis present

## 2011-09-09 DIAGNOSIS — E876 Hypokalemia: Secondary | ICD-10-CM | POA: Diagnosis present

## 2011-09-09 DIAGNOSIS — A0472 Enterocolitis due to Clostridium difficile, not specified as recurrent: Secondary | ICD-10-CM | POA: Diagnosis present

## 2011-09-09 DIAGNOSIS — F1210 Cannabis abuse, uncomplicated: Secondary | ICD-10-CM | POA: Diagnosis present

## 2011-09-09 DIAGNOSIS — D649 Anemia, unspecified: Secondary | ICD-10-CM | POA: Diagnosis present

## 2011-09-09 DIAGNOSIS — B1920 Unspecified viral hepatitis C without hepatic coma: Secondary | ICD-10-CM | POA: Diagnosis present

## 2011-09-09 DIAGNOSIS — K921 Melena: Secondary | ICD-10-CM

## 2011-09-09 DIAGNOSIS — J029 Acute pharyngitis, unspecified: Secondary | ICD-10-CM | POA: Diagnosis present

## 2011-09-09 DIAGNOSIS — Z8614 Personal history of Methicillin resistant Staphylococcus aureus infection: Secondary | ICD-10-CM

## 2011-09-09 DIAGNOSIS — F17200 Nicotine dependence, unspecified, uncomplicated: Secondary | ICD-10-CM | POA: Diagnosis present

## 2011-09-09 LAB — POCT I-STAT, CHEM 8
BUN: 9 mg/dL (ref 6–23)
Calcium, Ion: 1.17 mmol/L (ref 1.12–1.32)
Chloride: 107 mEq/L (ref 96–112)
Creatinine, Ser: 0.7 mg/dL (ref 0.50–1.35)
Glucose, Bld: 106 mg/dL — ABNORMAL HIGH (ref 70–99)

## 2011-09-09 LAB — CBC
MCH: 30.2 pg (ref 26.0–34.0)
MCV: 87.6 fL (ref 78.0–100.0)
Platelets: 292 10*3/uL (ref 150–400)
RBC: 4.53 MIL/uL (ref 4.22–5.81)
RDW: 12.9 % (ref 11.5–15.5)

## 2011-09-09 LAB — DIFFERENTIAL
Eosinophils Absolute: 0.2 10*3/uL (ref 0.0–0.7)
Eosinophils Relative: 2 % (ref 0–5)
Lymphs Abs: 1.4 10*3/uL (ref 0.7–4.0)
Monocytes Relative: 7 % (ref 3–12)

## 2011-09-09 MED ORDER — IOHEXOL 300 MG/ML  SOLN
100.0000 mL | Freq: Once | INTRAMUSCULAR | Status: AC | PRN
  Administered 2011-09-09: 100 mL via INTRAVENOUS

## 2011-09-10 ENCOUNTER — Inpatient Hospital Stay (HOSPITAL_COMMUNITY): Payer: Self-pay

## 2011-09-10 LAB — CLOSTRIDIUM DIFFICILE BY PCR: Toxigenic C. Difficile by PCR: POSITIVE — AB

## 2011-09-10 LAB — CBC
Hemoglobin: 12 g/dL — ABNORMAL LOW (ref 13.0–17.0)
MCH: 29.9 pg (ref 26.0–34.0)
MCHC: 33.5 g/dL (ref 30.0–36.0)

## 2011-09-11 ENCOUNTER — Inpatient Hospital Stay (HOSPITAL_COMMUNITY): Payer: Self-pay

## 2011-09-11 DIAGNOSIS — A0472 Enterocolitis due to Clostridium difficile, not specified as recurrent: Secondary | ICD-10-CM

## 2011-09-12 LAB — BASIC METABOLIC PANEL
BUN: 3 mg/dL — ABNORMAL LOW (ref 6–23)
CO2: 28 mEq/L (ref 19–32)
Chloride: 106 mEq/L (ref 96–112)
GFR calc non Af Amer: >60 mL/min (ref >60)
Glucose, Bld: 87 mg/dL (ref 70–99)
Potassium: 3.3 mEq/L — ABNORMAL LOW (ref 3.5–5.1)

## 2011-09-12 LAB — CBC
HCT: 35.8 % — ABNORMAL LOW (ref 39.0–52.0)
Hemoglobin: 12.2 g/dL — ABNORMAL LOW (ref 13.0–17.0)
MCHC: 34.1 g/dL (ref 30.0–36.0)
RBC: 4.05 MIL/uL — ABNORMAL LOW (ref 4.22–5.81)

## 2011-09-13 LAB — BASIC METABOLIC PANEL
BUN: 3 mg/dL — ABNORMAL LOW (ref 6–23)
CO2: 30 mEq/L (ref 19–32)
Chloride: 103 mEq/L (ref 96–112)
Creatinine, Ser: 0.65 mg/dL (ref 0.50–1.35)
Glucose, Bld: 94 mg/dL (ref 70–99)

## 2011-09-14 DIAGNOSIS — A0472 Enterocolitis due to Clostridium difficile, not specified as recurrent: Secondary | ICD-10-CM

## 2011-09-14 LAB — BASIC METABOLIC PANEL
CO2: 28 mEq/L (ref 19–32)
Calcium: 9.1 mg/dL (ref 8.4–10.5)
Chloride: 104 mEq/L (ref 96–112)
Creatinine, Ser: 0.62 mg/dL (ref 0.50–1.35)
Glucose, Bld: 99 mg/dL (ref 70–99)

## 2011-09-14 LAB — STOOL CULTURE

## 2011-09-14 NOTE — Discharge Summary (Signed)
  NAMEDEVEON, [name]               ACCOUNT NO.:  [id number]  MEDICAL RECORD NO.:  [id number]  LOCATION:  4503                         FACILITY:  [facility]  PHYSICIAN:  Tarry Kos, MD       DATE OF BIRTH:  10/24/1981  DATE OF ADMISSION:  08/15/2011 DATE OF DISCHARGE:  08/19/2011                              DISCHARGE SUMMARY   DISCHARGE DIAGNOSIS: 1. Acute diverticulitis. 2. Chronic abdominal pain. 3. History of hepatitis C.  HOSPITAL COURSE:  Mr. [name] is a 30 year old male who presented to the emergency room with left lower abdominal quadrant pain.  He had a radiological evidence on CAT scan for slight thickening of the sigmoid colon suggestive for diverticulitis.  His white count was normal on admission.  He was started on Flagyl and ciprofloxacin and was conservatively managed and did improve.  His diet was advanced.  On the day discharge was tolerating a regular diet.  He has been afebrile and his vital signs have been stable.  He is being discharged to complete a course of ciprofloxacin and Flagyl for 10 days total.  The follow up with primary care physician in 1 week.  I have given him a week worth of pain medications only.  He does have a history of drug seeking behavior and polysubstance abuse.  However, that was really not the case during this hospitalization.  PHYSICAL EXAMINATION:  VITAL SIGNS:  Stable. GENERAL:  He has been afebrile.  Alert and oriented x4.  No apparent stress, cooperative, and friendly. HEENT:  Extraocular muscles intact.  Pupils equal, round, and reactive to light.  Oropharynx clear.  Mucous membranes moist. NECK:  No JVD.  No carotid bruits. HEART:  Regular rate and rhythm without murmurs, rubs, or gallops. CHEST:  Clear to auscultation bilaterally.  No wheezes or rales. ABDOMEN:  Soft, nontender, nondistended.  Positive bowel sounds.  No hepatosplenomegaly. EXTREMITIES:  No clubbing, cyanosis, or edema. PSYCH:  Normal affect. NEURO:  No  focal neurologic deficits.  He is being discharged home again with a short 1-week supply of Vicodin along with 7 more days of ciprofloxacin and Flagyl.  He also had some carbuncles on his hand which have improved with doxycycline.  I am going to provide him with 5 more days of doxycycline also.  Follow up with primary care physician in 1 week.         ______________________________ Tarry Kos, MD    RD/MEDQ  D:  08/19/2011  T:  08/19/2011  Job:  [id number]  Electronically Signed by Tarry Kos MD on 09/14/2011 11:55:22 AM

## 2011-09-14 NOTE — H&P (Signed)
[name], BIGGINS               ACCOUNT NO.:  [id number]  MEDICAL RECORD NO.:  [id number]  LOCATION:  1540                         FACILITY:  [facility]  PHYSICIAN:  Lodema Pilot, MD       DATE OF BIRTH:  [birth year]  DATE OF ADMISSION:  09/09/2011 DATE OF DISCHARGE:                             HISTORY & PHYSICAL   CHIEF COMPLAINT:  Abdominal pain.  HISTORY OF PRESENT ILLNESS:  [name] is a 30 year old male who presents with 3 days of left lower quadrant pain, which he describes as "sharp and throbbing," and he describes this as a 9/10 pain.  He also complains of maroon blood in his stool today on the toilet paper, which led him to the emergency room.  In the emergency room, CT scan of the abdomen was obtained, which demonstrated a metallic foreign body in the sigmoid colon, and upon further questioning the patient he states that he swallowed his tongue ring approximately 3-4 days ago.  He says this happened 2 times before without any problems.  He also complains of some vomiting and some slight diarrhea today and says he had a temperature of 99.9.  He states that he has a history of diverticulitis and was admitted to hospital in [place] 2 years ago and he also had a C scope at that time showing the diverticulosis.  ALLERGIES:  None.  PAST MEDICAL HISTORY: 1. Hepatitis C. 2. MRSA. 3. Diverticulitis.  PAST SURGICAL HISTORY:  Colonoscopy.  SOCIAL HISTORY:  He smokes 1 pack per day and denies alcohol use.  He denied drug use initially and then when I asked him again specifically about marijuana use, he said he does has a history of smoking marijuana, last used marijuana 3 weeks ago.  MEDICATIONS:  None.  REVIEW OF SYSTEMS:  Otherwise negative.  FAMILY HISTORY:  Noncontributory.  PHYSICAL EXAMINATION:  GENERAL:  He is in no acute distress, resting comfortably in the bed.  He is alert and oriented x3 and responds appropriately the questions. VITAL SIGNS:  His  temperature is 98.6, heart rate 54, blood pressure 132/67, 18 respirations, and 99% on room air. HEENT:  His head is normocephalic and atraumatic.  His sclerae are white.  Pupils are round, equal, and reactive to light bilaterally.  His trachea is midline and no stridors. LUNGS:  Clear to auscultation bilateral. HEART:  Rate is normal with regular rhythm. ABDOMEN:  Soft.  He has some left lower quadrant tenderness, is nondistended, and he has no peritoneal signs. RECTUM:  Some external hemorrhoid disease, but no evidence of bleeding. EXTREMITIES:  Normal strength and range of motion.  Pulses are palpable in all 4 extremities.  RADIOGRAPHIC STUDIES:  He has a CT scan of the abdomen, which demonstrates a metallic foreign body consistent with his tongue stud in the distal sigmoid colon without any evidence of free air or inflammatory changes.  There is no thickening or stranding or evidence of diverticulitis.  No other acute finding.  Plain film of the abdomen demonstrates a metallic foreign body as well in the sigmoid colon with contrast in the in the colon as well.  LABORATORY STUDIES:  White count of 10.2, hemoglobin 14.3,  hematocrit 42, platelets 292, neutrophils 78%.  Sodium is 142, potassium is 3.8 , chloride is 106, BUN is 9, creatinine 0.7, glucose 106.  ASSESSMENT:  Colonic foreign body and abdominal pain.  I do not think that he has any surgical need at this time.  He does not have any evidence of perforation or inflammatory changes in his colon.  PLAN:  We will plan for admission to hospital and observe the passage of this foreign body with abdominal x-rays.  This should pass shortly spontaneously unless he has evidence of perforation, then I would not recommend any surgical intervention for him at this time.          ______________________________ Lodema Pilot, MD     BL/MEDQ  D:  09/09/2011  T:  09/10/2011  Job:  [id number]  Electronically Signed by Lodema Pilot DO  on 09/14/2011 09:58:15 AM

## 2011-09-15 LAB — BASIC METABOLIC PANEL
BUN: 6 mg/dL (ref 6–23)
CO2: 27 mEq/L (ref 19–32)
Calcium: 9 mg/dL (ref 8.4–10.5)
GFR calc non Af Amer: >60 mL/min (ref >60)
Glucose, Bld: 98 mg/dL (ref 70–99)

## 2011-09-22 ENCOUNTER — Emergency Department (HOSPITAL_COMMUNITY): Payer: Self-pay

## 2011-09-22 ENCOUNTER — Inpatient Hospital Stay (HOSPITAL_COMMUNITY)
Admission: EM | Admit: 2011-09-22 | Discharge: 2011-09-27 | DRG: 373 | Disposition: A | Discharge status: Home / Self Care | Payer: Self-pay | Attending: Internal Medicine | Admitting: Internal Medicine

## 2011-09-22 DIAGNOSIS — A0472 Enterocolitis due to Clostridium difficile, not specified as recurrent: Principal | ICD-10-CM | POA: Diagnosis present

## 2011-09-22 DIAGNOSIS — Z8614 Personal history of Methicillin resistant Staphylococcus aureus infection: Secondary | ICD-10-CM

## 2011-09-22 DIAGNOSIS — F1210 Cannabis abuse, uncomplicated: Secondary | ICD-10-CM | POA: Diagnosis present

## 2011-09-22 DIAGNOSIS — F1510 Other stimulant abuse, uncomplicated: Secondary | ICD-10-CM | POA: Diagnosis present

## 2011-09-22 DIAGNOSIS — B1920 Unspecified viral hepatitis C without hepatic coma: Secondary | ICD-10-CM | POA: Diagnosis present

## 2011-09-22 DIAGNOSIS — E876 Hypokalemia: Secondary | ICD-10-CM | POA: Diagnosis present

## 2011-09-22 DIAGNOSIS — F17200 Nicotine dependence, unspecified, uncomplicated: Secondary | ICD-10-CM | POA: Diagnosis present

## 2011-09-22 LAB — DIFFERENTIAL
Basophils Absolute: 0 10*3/uL (ref 0.0–0.1)
Basophils Relative: 0 % (ref 0–1)
Eosinophils Relative: 4 % (ref 0–5)
Lymphocytes Relative: 21 % (ref 12–46)
Monocytes Absolute: 0.7 10*3/uL (ref 0.1–1.0)
Monocytes Relative: 7 % (ref 3–12)

## 2011-09-22 LAB — CBC
HCT: 40.2 % (ref 39.0–52.0)
Hemoglobin: 13.8 g/dL (ref 13.0–17.0)
MCH: 30 pg (ref 26.0–34.0)
MCHC: 34.3 g/dL (ref 30.0–36.0)
RDW: 12.9 % (ref 11.5–15.5)

## 2011-09-22 LAB — BASIC METABOLIC PANEL
BUN: 14 mg/dL (ref 6–23)
Calcium: 9.5 mg/dL (ref 8.4–10.5)
GFR calc Af Amer: >60 mL/min (ref >60)
GFR calc non Af Amer: >60 mL/min (ref >60)
Glucose, Bld: 93 mg/dL (ref 70–99)
Potassium: 3.7 mEq/L (ref 3.5–5.1)

## 2011-09-22 NOTE — H&P (Signed)
[name], [name]               ACCOUNT NO.:  [id number]  MEDICAL RECORD NO.:  [id number]  LOCATION:  [facility]                         FACILITY:  [facility]  PHYSICIAN:  Andreas Blower, MD       DATE OF BIRTH:  [birth year]  DATE OF ADMISSION:  09/22/2011 DATE OF DISCHARGE:                             HISTORY & PHYSICAL   PRIMARY CARE PHYSICIAN:  The patient is scheduled to see [facility], has not had the first appointment yet.  CHIEF COMPLAINT:  Abdominal pain, diarrhea.  HISTORY OF PRESENT ILLNESS:  [name] is a 30 year old male with history of recurrent C.diff colitis, who was recently discharged on September 16, 2011 with oral vancomycin taper.  The patient also has a history of hepatitis C and diverticulitis and history of ingestion of foreign object with passage.  Since being discharged on September 16, 2011, he reports that his roommate had accidentally thrown out his vancomycin on September 18, 2011 subsequently since then he has not been taking oral vancomycin and has noted worsening diarrhea with increasing abdominal pain with now bloody diarrhea, as a result he presented to the ER for further evaluation.  He reports that after his roommate thrown out the oral vancomycin accidentally he had checked the garbage dump and did not find the medication.  He reports that today he has had at least 10 bowel movements with some bloody stool in it.  He denies any recent fevers, chills, nausea, vomiting.  Denies any chest pain, shortness of breath.  Denies any headaches or vision changes.  Does complain of crampy abdominal pain with diarrhea.  He reports that his symptoms are worsen on previous hospitalization.  REVIEW OF SYSTEMS:  All systems were reviewed with the patient was positive as per HPI, otherwise all other systems are negative.  PAST MEDICAL HISTORY: 1. 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 2. History of hepatitis C. 3. History of MRSA 4. History of  polysubstance abuse with cannabis and tobacco use.  SOCIAL HISTORY:  The patient is smoking one pack per day.  Denies any alcohol use.  FAMILY HISTORY:  Reports that mother died from suicide, also had ovarian cancer.  HOME MEDICATIONS:  Oral vancomycin on a taper and has not been taking at times since September 18, 2011.  The patient is on oxycodone/acetaminophen as needed for pain.  PHYSICAL EXAMINATION:  VITAL SIGNS:  Temperature 98.4, blood pressure 1306/83 heart rate 89, respirations 15, and satting 98% on room air. GENERAL:  The patient was alert and oriented, not appear to be in acute distress, was laying in bed comfortably. HEENT:  Extraocular motions are intact.  Pupils equal, round, had moist mucous membranes. NECK:  Supple. HEART:  Regular with S1-S2. LUNGS:  Clear to auscultation bilaterally. ABDOMEN:  Soft, had generalized tenderness to palpation.  Had minimal guarding, no rebound tenderness. EXTREMITIES:  The patient has good peripheral pulses with trace edema. NEURO:  Nonfocal.  Cranial nerves II-XII grossly intact, had 5/5 motor strength in upper and lower extremities.RADIOLOGY/IMAGING:  The patient had abdominal x-ray which shows no evidence of small-bowel obstruction or free air.  LABORATORY DATA:  CBC shows white count of 9.6, hemoglobin 13.8, hematocrit 40.2,  platelet count 266.  Electrolytes normal with a BUN of 14 and creatinine 0.58.  ASSESSMENT/PLAN: 1. Abdominal pain, diarrhea, likely due to ongoing Clostridium     difficile colitis.  We will start the patient on oral vancomycin     250 mg p.o. q.i.d., once diarrhea and abdominal pain is better     controlled, could start him on taper at that time.  We will have     case manager to talk with the patient in helping find oral     vancomycin at the time of discharge. Will have the patient     on hydration to prevent any dehydration. 2. Tobacco abuse.  Encouraged smoking cessation.  We will have the      patient on p.r.n. nicotine patch.  I counseled the patient on     smoking cessation. 3. History of hepatitis C,  further management as outpatient. 4. Prophylaxis sequential compression devices for deep venous     thrombosis prophylaxis. 5. Code status.  The patient is full code.  TIME SPENT ON ADMISSION:  Talking to the patient and coordinating care was 45 minutes.   Andreas Blower, MD   SR/MEDQ  D:  09/22/2011  T:  09/22/2011  Job:  [id number]  Electronically Signed by Wardell Heath Dominic Rhome  on 09/22/2011 09:06:12 PM

## 2011-09-23 LAB — BASIC METABOLIC PANEL
BUN: 9 mg/dL (ref 6–23)
GFR calc Af Amer: >60 mL/min (ref >60)
GFR calc non Af Amer: >60 mL/min (ref >60)
Potassium: 3.4 mEq/L — ABNORMAL LOW (ref 3.5–5.1)
Sodium: 138 mEq/L (ref 135–145)

## 2011-09-23 LAB — CBC
HCT: 36.3 % — ABNORMAL LOW (ref 39.0–52.0)
MCHC: 33.3 g/dL (ref 30.0–36.0)
Platelets: 231 10*3/uL (ref 150–400)
RDW: 12.8 % (ref 11.5–15.5)
WBC: 7.4 10*3/uL (ref 4.0–10.5)

## 2011-09-23 LAB — URINE DRUGS OF ABUSE SCREEN W ALC, ROUTINE (REF LAB)
Amphetamine Screen, Ur: POSITIVE — AB
Barbiturate Quant, Ur: NEGATIVE
Creatinine,U: 112.1 mg/dL
Ethyl Alcohol: <10 mg/dL (ref <10)
Phencyclidine (PCP): NEGATIVE

## 2011-09-24 LAB — CBC
Hemoglobin: 13 g/dL (ref 13.0–17.0)
MCHC: 34.3 g/dL (ref 30.0–36.0)
RDW: 12.5 % (ref 11.5–15.5)
WBC: 5.8 10*3/uL (ref 4.0–10.5)

## 2011-09-24 LAB — BASIC METABOLIC PANEL
GFR calc Af Amer: >90 mL/min (ref >90)
GFR calc non Af Amer: >90 mL/min (ref >90)
Glucose, Bld: 97 mg/dL (ref 70–99)
Potassium: 3.9 mEq/L (ref 3.5–5.1)
Sodium: 137 mEq/L (ref 135–145)

## 2011-09-24 NOTE — Consult Note (Signed)
NAMEDEMARKO, ZEIMET               ACCOUNT NO.:  [id number]  MEDICAL RECORD NO.:  [id number]  LOCATION:  1540                         FACILITY:  [facility]  PHYSICIAN:  Thad Ranger, MD       DATE OF BIRTH:  04/23/1981  DATE OF CONSULTATION: DATE OF DISCHARGE:                                CONSULTATION   REFERRING PHYSICIAN:  Dr. Derrell Lolling, [facility].  PRIMARY CARE PHYSICIAN:  Dr. Andrey Campanile at [facility]  REASON FOR CONSULTATION:  Recurrent flatus and abdominal pain.  HISTORY OF PRESENT ILLNESS:  Mr. Petitjean is a 30 year old male who has a history of diverticulitis, chronic abdominal pain, and history of hepatitis C.  He is admitted on the [facility] service for acute abdominal pain.  At the time of admission, the patient had complained of 3 days of left lower quadrant pain prior to admission and melanotic stool.  In the emergency room, CT scan was obtained, which demonstrated a metallic foreign body in the sigmoid colon.  On further questioning, the patient had stated that he swallowed his tongue ring approximately 3 to 4 days prior to admission.  He also complained of having nausea, vomiting, and diarrhea.  At the time of admission, the patient was admitted under [facility] service.  Medicine Service was consulted due to recurrent Clostridium difficile colitis, rectal bleeding, and abdominal pain.  PAST MEDICAL HISTORY: 1. History of Clostridium diff twice since July 2012, positive     Clostridium difficile, positive on July 06, 2011.  Second     Clostridium difficile positive on July 14, 2011.  Third Clostridium     difficile positive on September 10, 2011. 2. History of hepatitis C. 3. History of the methicillin-resistant Staphylococcus aureus. 4. History of diverticulitis.  PAST SURGICAL HISTORY:  Colonoscopy.  SOCIAL HISTORY:  The patient states that he smokes 1-pack per day. Denies any alcohol use.  He smokes marijuana.  MEDICATIONS:  None prior to admission.  FAMILY HISTORY:   Noncontributory.  ALLERGIES:  No known allergies.  PHYSICAL EXAMINATION:  VITAL SIGNS:  At the time of dictation, pulse 58, temperature 98.2, blood pressure 145/102, respiratory rate 20, and O2 saturation is 99% on room air. GENERAL:  The patient is alert, awake, and oriented x3.  Uncomfortable, responds appropriately to all the questions. HEENT:  Anicteric sclerae.  Pale conjunctivae.  Pupils reactive to light and accommodation.  EOMI. NECK:  Supple.  No lymphadenopathy. CVS:  S1 and S2 clear. CHEST:  Clear to auscultation bilaterally. ABDOMEN:  Soft, mild diffuse tenderness worse in the left lower quadrant. EXTREMITIES:  No cyanosis, clubbing, or edema noted in upper or lower extremities bilaterally.  LABORATORY AND DIAGNOSTIC DATA:  Clostridium difficile positive.  No labs are available from today; however, from the labs done yesterday, CBC shows white count of 9.4, hemoglobin 12.0, hematocrit 35.8, and platelets 254.  RECOMMENDATION AND PLAN: 1. Recurrent Clostridium difficile colitis.  Please note that this is     the third episode of the positive CT since July 2012.  The patient     is on p.o. Flagyl at this time.  I question the patient's     compliance in adherence to the Flagyl  during the previous episodes     on the outpatient basis.  Currently, the patient is on clear liquid     diet, advance as tolerated.  I will place him on p.o. vancomycin     and Florastor, given question of compliance, cost-effectiveness and     adherence, the patient will need case management assistance for the     vancomycin.  I have also discussed with Dr. Cliffton Asters from     Infectious Disease Service for the recommendations, given my     concerns as dictated above.  The patient will be placed on     Clostridium difficile protocol. 2. Diarrhea.  The patient states that he is having constant diarrhea     about 8 times today.  I will give him 1 dose of cholestyramine for     now. 3. Rectal  bleeding likely secondary to Clostridium difficile colitis.     The patient did have CT of the abdomen and pelvis with contrast on     September 09, 2011, which did not show any colonic inflammation,     free fluid, pneumoperitoneum, or any megacolon.  I have     discontinued the Lovenox, keep him on bilateral SCDs and continue     to treat Clostridium difficile colitis. 4. Nicotine abuse and polysubstance abuse.  The patient was counseled     strongly on smoking cessation and marijuana cessation.  He is     currently on NicoDerm patch, which should be continued. 5. Prophylaxis, bilateral SCDs for DVT prophylaxis.     Thad Ranger, MD     RR/MEDQ  D:  09/11/2011  T:  09/11/2011  Job:  [id number]  cc:   Dr. Andrey Campanile, [facility].  Angelia Mould. Derrell Lolling, M.D. [address], [address] [place] [address]  Cliffton Asters, M.D. Fax: [phone]  Electronically Signed by Elleanor Guyett  on 09/24/2011 03:19:43 PM

## 2011-09-25 LAB — BASIC METABOLIC PANEL
GFR calc Af Amer: >90 mL/min (ref >90)
GFR calc non Af Amer: >90 mL/min (ref >90)
Potassium: 4.1 mEq/L (ref 3.5–5.1)
Sodium: 138 mEq/L (ref 135–145)

## 2011-09-26 ENCOUNTER — Inpatient Hospital Stay (HOSPITAL_COMMUNITY): Payer: Self-pay

## 2011-09-26 LAB — OPIATE, QUANTITATIVE, URINE
Codeine Urine: NEGATIVE NG/ML
Hydromorphone GC/MS Conf: 538 NG/ML — ABNORMAL HIGH

## 2011-09-26 LAB — AMPHETAMINES URINE CONFIRMATION
Methylenedioxyethylamphetamine: NEGATIVE NG/ML
Methylenedioxymethamphetamine: NEGATIVE NG/ML

## 2011-10-04 NOTE — Discharge Summary (Signed)
[name], [name]               ACCOUNT NO.:  [id number]  MEDICAL RECORD NO.:  [id number]  LOCATION:  1540                         FACILITY:  [facility]  PHYSICIAN:  Altha Harm, MDDATE OF BIRTH:  [birth year]  DATE OF ADMISSION:  09/09/2011 DATE OF DISCHARGE:  09/16/2011                              DISCHARGE SUMMARY   DISCHARGE DISPOSITION:  Home.  FINAL DISCHARGE DIAGNOSES: 1. Ingestion of foreign object with passage. 2. Recurrent Clostridium difficile colitis. 3. History of hepatitis C. 4. History of diverticulitis. 5. Pharyngitis.  DISCHARGE MEDICATIONS:  Include the following: 1. Cepacol lozenges by mouth as needed. 2. Oxycodone/APAP 5/325 two tablets p.o. q.4 hours p.r.n. pain. 3. Vancomycin on a slow taper over 4 weeks, please see medical record     for details. 4. Ibuprofen 400 mg p.o. q.6 hours p.r.n. pain.  CONSULTANTS:  Angelia Mould. Derrell Lolling, M.D., General Surgery  PROCEDURES:  None.  DIAGNOSTIC STUDIES: 1. CT of abdomen and pelvis with contrast on September 16, which shows     a new 2-cm linear metallic foreign body within the proximal sigmoid     colon -  correlate clinically.  No evidence of colonic     inflammation, free fluid, or pneumoperitoneum. 2. Two-view abdominal x-ray done on the 16th, which shows metallic     foreign body within the sigmoid colon. 3. Abdominal two-view x-ray done on the 17th, which shows metallic     foreign body in the region of the sigmoid colon. 4. Repeat abdominal x-ray done on the 18th, which shows the metal     foreign body has passed since the prior study.  Negative bowel     obstruction.  Mild ileus.  PRIMARY CARE PHYSICIAN:  Previously unassigned.  The patient will follow up with [facility] as an outpatient.  CODE STATUS:  Full code.  ALLERGIES:  No known drug allergies.  CHIEF COMPLAINT:  Abdominal pain.  HISTORY OF PRESENT ILLNESS:  Please refer to the H and P by surgeon, Dr. Lodema Pilot, for details of the  HPI.  However, in short, this is a 30- year-old gentleman who presented to the emergency room with a 3-day history of left lower quadrant pain.  He was found to have a foreign body in the sigmoid colon.  General Surgery was consulted and they actually admitted the patient to their service.  About 48 hours after the object had passed, the patient was having diarrhea and studies were sent for C diff.  The patient was found to be positive for C. diff colitis.  [facility] hospitalists were called and asked that the patient be transferred to the Medicine Service as there was no surgical diagnosis and no surgical intervention necessary.  We graciously accepted the patient onto our service and to manage as needed.  HOSPITAL COURSE: 1. C. diff colitis.  The patient presented with C. diff colitis.  This     has been his third presentation in approximately 3 months in which     he was positive for C. diff colitis.  The patient had been treated     with Flagyl in the past.  However, Dr. Isidoro Donning questioned the patient's  compliance with the Flagyl.  Nevertheless, he was started on     vancomycin, he states that he did comply with his medications.  The     patient is being placed on a long taper over 4 weeks.  Presently,     he is on 125 mg four times a day, which he is to  complete on     September 19, 2011, then change to a b.i.d. regimen for 7 days     until October 3, then a daily regimen for 7 days until October 10,     then a q.48 regimen until October 16,  then q.72-hour regimen until     October 31, then stop.  The patient has also been taking probiotics     and Questran for the diarrhea.  The patient's diarrhea has slowed     down considerably and he is able to keep up with his oral intake to     match his GI losses. 2. Ingestion of a foreign object.  The patient accidently swallowed     his tongue ring, which passed successfully based on radiologic     studies. 3. Pharyngitis.  The patient  complained of soreness and a scratch in     his throat.  However, on examination, he had no pharyngeal     erythema, exudate or lesions noted.  He had no fever or elevation     of white blood cell count to indicate an infection.  The patient     was treated symptomatically with lozenges to help with the pain.  Otherwise, the patient has remained stable during this hospitalization. At the time of discharge, he is stable.  PHYSICAL EXAMINATION:  GENERAL:  The patient is well-appearing. VITAL SIGNS:  Temperature is 97.7, heart rate 61, blood pressure 115/74, respiratory rate 20, O2 sats are 94% on room air. HEENT EXAMINATION:  He is normocephalic, atraumatic.  Pupils are equally round and reactive to light and accommodation.  Extraocular movements are intact.  Oropharynx is moist.  No exudate, erythema, or lesions are noted. NECK EXAMINATION:  Trachea is midline.  No masses, no thyromegaly, no JVD, no carotid bruits. RESPIRATORY EXAMINATION:  He has a normal respiratory effort, equal excursion bilaterally.  No wheezing or rhonchi noted. CARDIOVASCULAR:  He has got normal S1 and S2.  No murmurs, rubs, or gallops noted.  PMI is nondisplaced.  No heaves or thrills on palpation. ABDOMEN:  Obese, soft, nontender, nondistended.  No masses or hepatosplenomegaly is noted. EXTREMITIES:  Show no clubbing, cyanosis, or edema.  DIETARY RESTRICTIONS:  None.  PHYSICAL RESTRICTIONS:  None.  FOLLOWUP:  As noted, the patient has a follow-up appointment with [facility] on October 17, 2011.     Altha Harm, MD     MAM/MEDQ  D:  09/16/2011  T:  09/16/2011  Job:  [id number]  Electronically Signed by Marthann Schiller MD on 10/04/2011 07:27:17 AM

## 2011-10-05 IMAGING — CT CT ABD-PELV W/ CM
2 of 5 series · 14 of 32 positions shown, 19 images · IV contrast (omnipaque)
Comparison: CT abdomen and pelvis with contrast 08/01/2011.

CLINICAL DATA: Left lower quadrant abdominal pain.  Nausea
vomiting.  History diverticulitis.

CT ABDOMEN AND PELVIS WITH CONTRAST
TECHNIQUE: Multidetector CT imaging of the abdomen and pelvis was
performed following the standard protocol during bolus
administration of intravenous contrast.
Contrast: 100 ml Omnipaque 300

[Series 2: routine abdomen · axial · 0.74mm/px · z∈[-416,-106]mm · 6 of 88 slices shown, 11 images]
[im 13/88  soft-tissue]
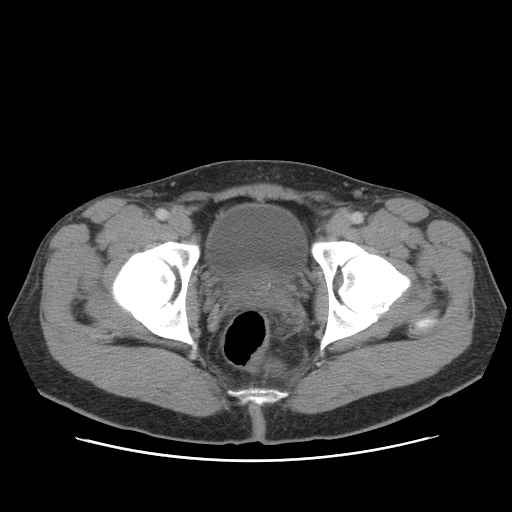
[im 13/88  bone]
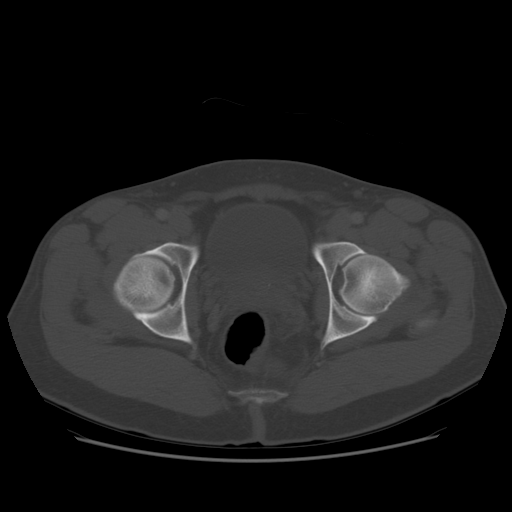
[im 25/88  soft-tissue]
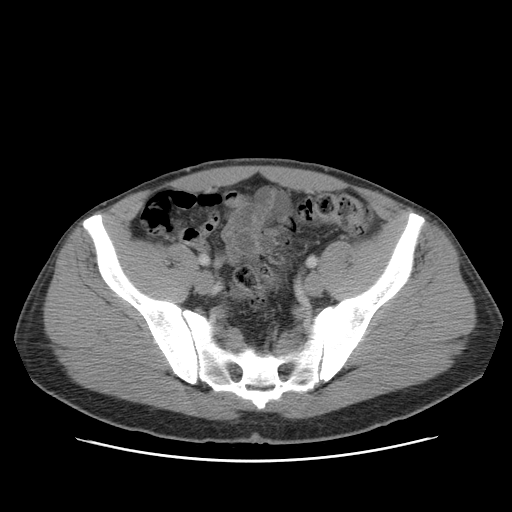
[im 38/88  soft-tissue]
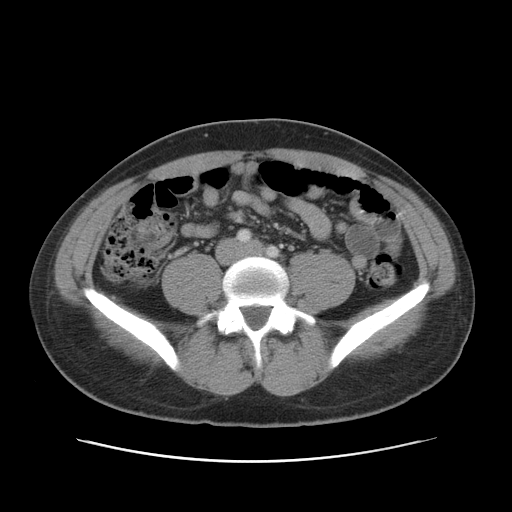
[im 38/88  lung]
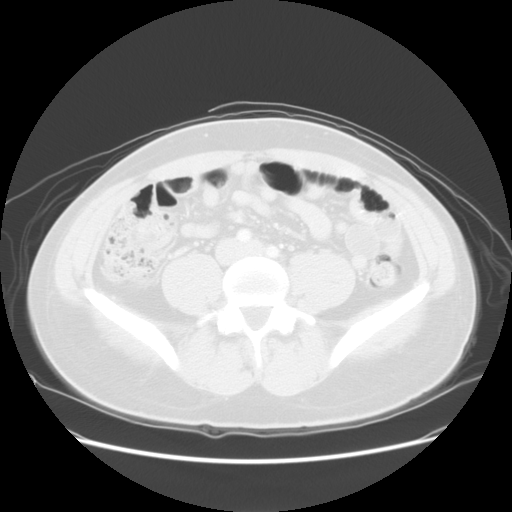
[im 50/88  soft-tissue]
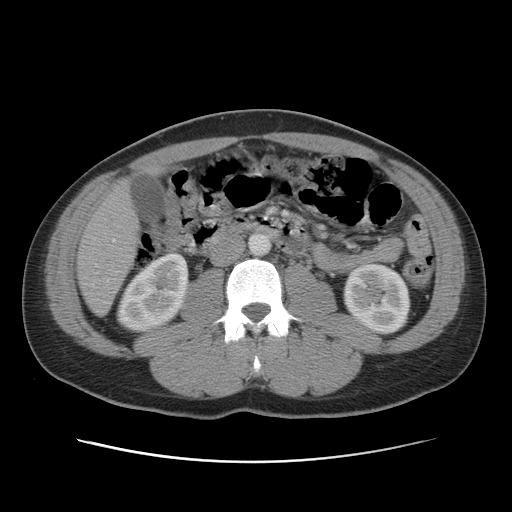
[im 50/88  lung]
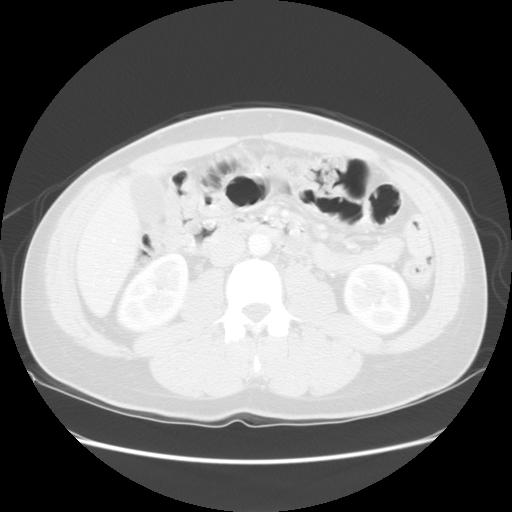
[im 63/88  soft-tissue]
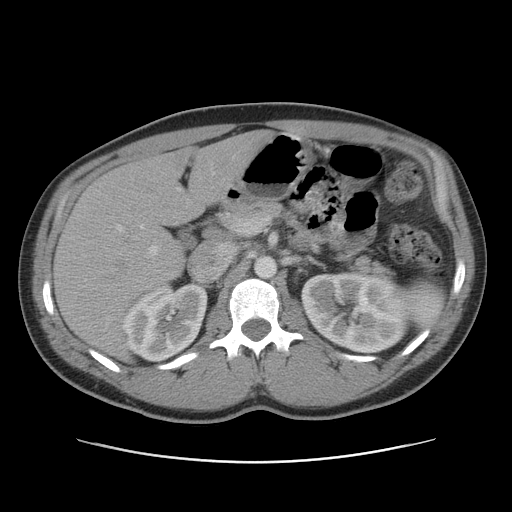
[im 63/88  lung]
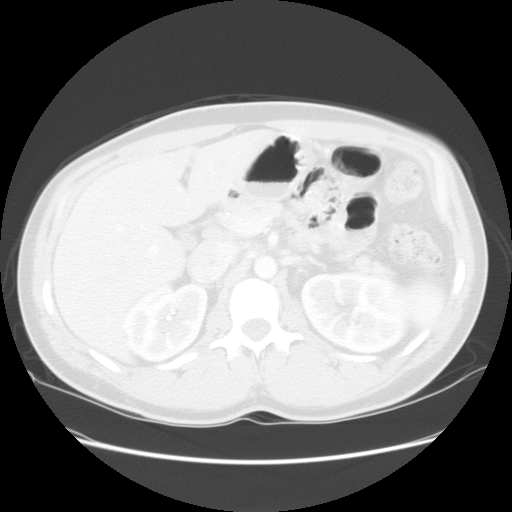
[im 75/88  soft-tissue]
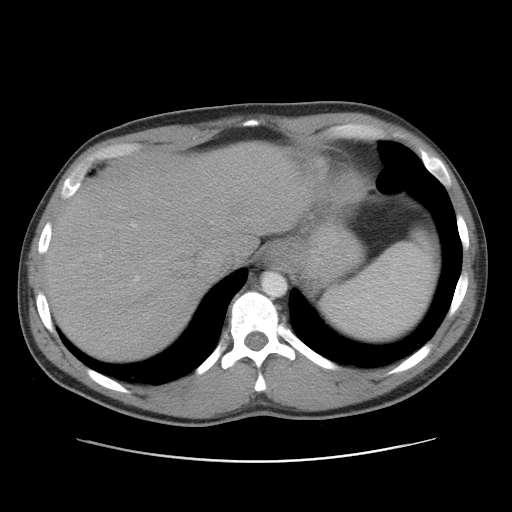
[im 75/88  lung]
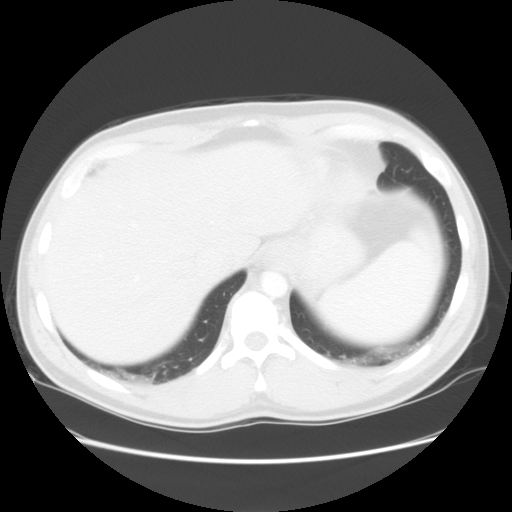

[Series 401: sagittal · sagittal · 0.92mm/px · 8 of 114 slices shown]
[im 12/114  soft-tissue]
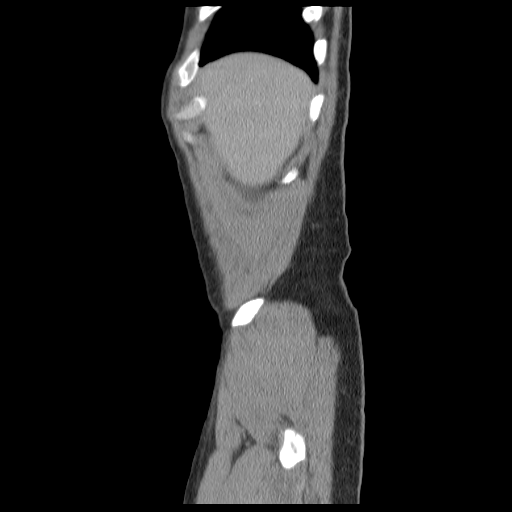
[im 23/114  soft-tissue]
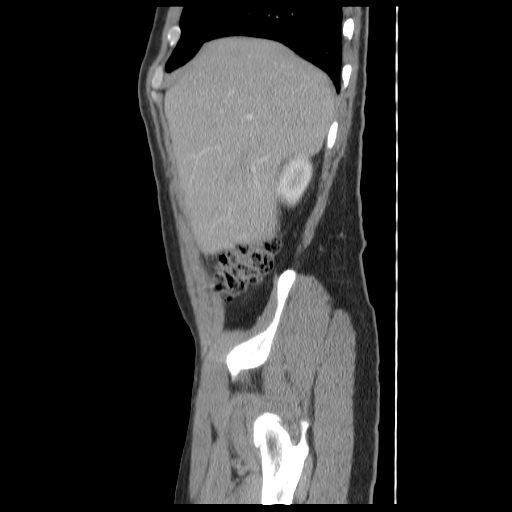
[im 34/114  soft-tissue]
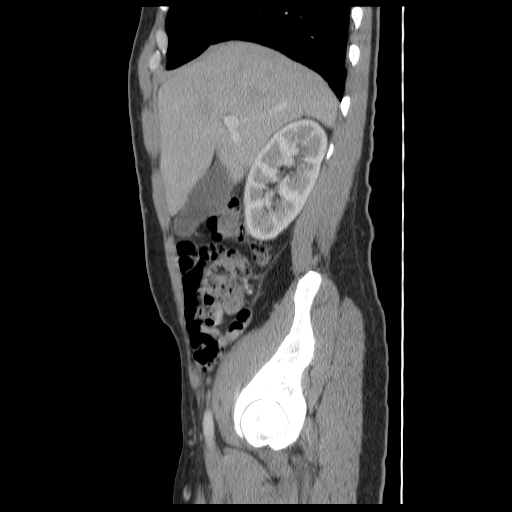
[im 46/114  soft-tissue]
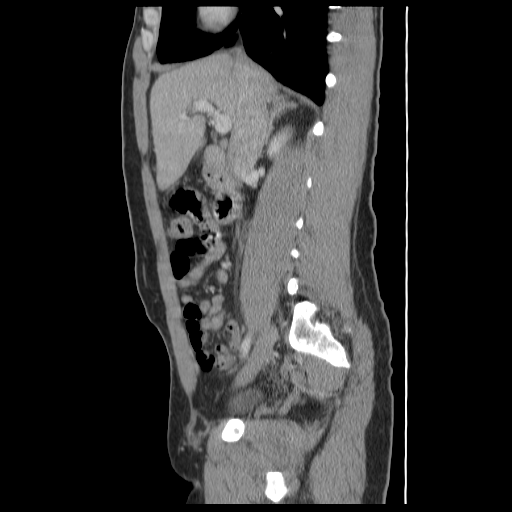
[im 68/114  soft-tissue]
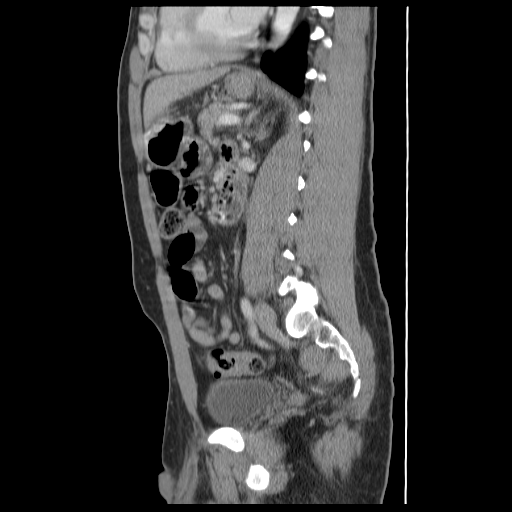
[im 80/114  soft-tissue]
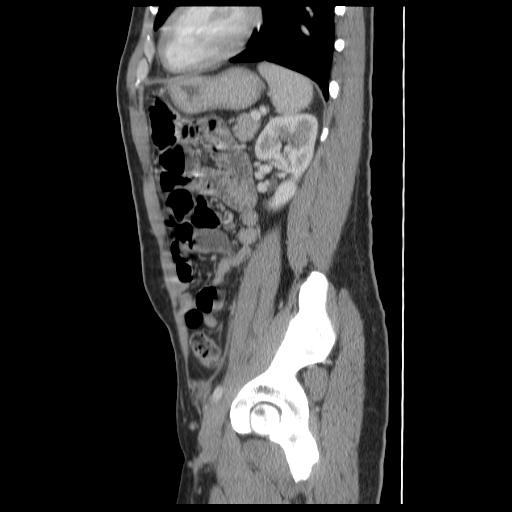
[im 91/114  soft-tissue]
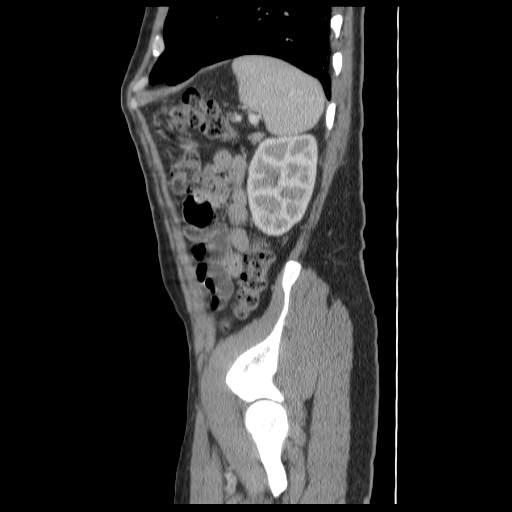
[im 102/114  soft-tissue]
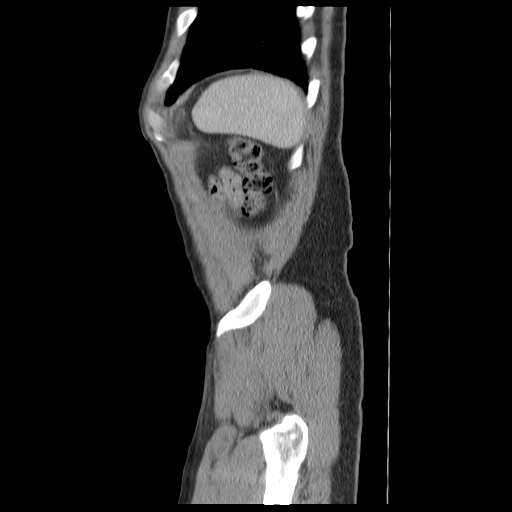

[14 of 32 positions shown; findings below may reference images not displayed]

FINDINGS: Minimal atelectasis is present at the lung bases
bilaterally.  The heart size is normal.  No significant pleural or
pericardial effusion is present.

A 12 mm hypodense lesion in the right lobe of liver is stable. The
liver is otherwise unremarkable.  The spleen is within normal
limits.  The stomach, duodenum, and pancreas are within normal
limits.  The common bile duct and gallbladder normal.  The adrenal
glands and kidneys are unremarkable.  Ureters are within normal
limits to the level of the urinary bladder.

Mild thickening of the sigmoid wall is again noted.  There is no
significant inflammatory change surrounding the sigmoid colon.  The
remainder of the colon is unremarkable.  The appendix is visualized
and within normal limits.  The small bowel is normal.  No
significant abdominal adenopathy or free fluid is present.

Degenerative spurring of the right femoral head is again noted.
The bone windows are otherwise unremarkable.
IMPRESSION: 1.  Slight thickening of the sigmoid colon is again noted.  This is
increased from the prior study and is more similar to the study of
07/03/2011.  This is still a fairly subtle finding.  While this
could represent early diverticulitis, focal nonspecific colitis or
Crohn's disease is still considered.
2.  Other incidental findings are stable.

## 2011-10-06 NOTE — H&P (Signed)
NAMEFITZROY, [name]               ACCOUNT NO.:  [id number]  MEDICAL RECORD NO.:  [id number]  LOCATION:  [facility]                         FACILITY:  [facility]  PHYSICIAN:  Lonia Blood, M.D.      DATE OF BIRTH:  12-06-1981  DATE OF ADMISSION:  08/15/2011 DATE OF DISCHARGE:                             HISTORY & PHYSICAL   PRIMARY CARE PHYSICIAN:  The patient is currently unassigned to [place].  PRESENTING COMPLAINT:  Abdominal pain left side and hematochezia.  HISTORY OF PRESENT ILLNESS:  The patient is a 30 year old gentleman with known history of chronic abdominal pain and polysubstance abuse who apparently has had prior history of diverticular disease.  He came into the ED today secondary to 2 days of continuous left-sided abdominal pain associated with some nausea and vomiting.  The patient has also had some black stools.  He said in his stool.  This happened just once.  He has recently had sores on his right finger from cutting his hand while he was working and he believes he has "MRSA."  The patient denied any fever or chills.  He denied any hematemesis.  No melena.  He is nauseated. But not having any active vomiting at this point.  His abdominal pain was rated as 6/10 at the moment.  It was as high as 8/10.  He denied any known aggravating factors or relieving factors except that when he ate, it seems to have made the pain worse.  His past medical history is significant for chronic abdominal pain, history of prior C diff colitis, constipation,  diverticular disease, polysubstance abuse, and hepatitis C.  ALLERGIES:  No known drug allergies.  MEDICATIONS:  He takes no medicines at home.  SOCIAL HISTORY:  The patient lives in [place], [place].  He smokes about a pack per day.  Denied alcohol use.  He used cannabis. There is a reported history of narcotic abuse but the patient denies. He had flexible sigmoidoscopy in the past.  His last admission was on July 16, 2011.  FAMILY HISTORY:  Patient reports significant history of some type of ovarian cancer is his mouth, terminal lung cancer in his aunt, and hypertension in his grandmother.  REVIEW OF SYSTEMS:  All systems reviewed are negative except per HPI.  PHYSICAL EXAMINATION:  VITAL SIGNS:  Temperature is 98.3, blood pressure 140/89, pulse 77, respiratory rate 18.  His sat is 99% on room air. GENERAL:  He is awake, alert, oriented man.  He was in no acute distress. HEENT:  PERRL.  EOMI.  No pallor.  No jaundice.  No rhinorrhea. NECK:  Supple.  No JVD.  No lymphadenopathy. RESPIRATORY:  He has good air entry bilaterally.  No wheezes or rales. CARDIOVASCULAR:  He has S1  and S2.  No murmur. ABDOMEN:  Soft, full, nontender with positive bowel sounds. EXTREMITIES:  No edema, cyanosis, or clubbing. SKIN:  No rashes or ulcers. ABDOMEN:  Slightly tender in the left lower quadrant.  No guarding and no peritoneal signs.  LABORATORY DATA:  His white count is 10.4, hemoglobin is 13.7 with platelet count of 257.  Sodium 141, potassium 3.6, chloride 106, CO2 of 28, glucose 91, BUN  10, creatinine 0.59, calcium 9.2, total protein 6.3, albumin 3.4. His lipase is 23.  Urinalysis is negative.  Urine drug screen positive for opiates and THC.  CT abdomen and pelvis showed slight thickening of the sigmoid colon, increased from the prior study more similar to the study of July 03, 2011.  Fairly subtle finding probably early diverticulitis or focal nonspecific colitis or Crohn disease.  The other incidental findings that especially is a spur on the right femoral head.  ASSESSMENT:  This 30 year old gentleman presenting with left lower quadrant pain, tenderness and possible colitis.  PLAN: 1. Sigmoid diverticulitis, more than likely the patient reported     eating nuts and seeds.  I will put him on Cipro, Flagyl, clear     liquids, and pain control.  Once the patient's symptoms stabilized     and he is able  to have his diet advanced, we will send him home.     He will finish antibiotics for at least 7-10 days. 2. Tobacco abuse.  I will put him on nicotine patch and tobacco     cessation counseling. 3. Right hand wounds.  The patient has multiple sores it looks like     carbuncles.  He reportedly thought that he had MRSA but that has     not been cultured.  I will put him empirically on doxycycline and     some wound care. 4. History of Clostridium difficile colitis.  He is not having any     diarrhea, but we will be watchful with antibiotic use. 5. Chronic pain syndrome.  I will be very careful with narcotic use in     this patient and try and get him off the medicine as soon as     possible. 6. History of hepatitis C.  Again, we will watch the patient closely.     His liver functions seem to be fine at this point. 7. Polysubstance abuse.  We will continue with counseling as     necessary.     Lonia Blood, M.D.     Verlin Grills  D:  08/15/2011  T:  08/15/2011  Job:  [id number]  Electronically Signed by Lonia Blood M.D. on 10/06/2011 02:56:34 PM

## 2011-10-08 NOTE — Discharge Summary (Signed)
[name], [name]               ACCOUNT NO.:  [id number]  MEDICAL RECORD NO.:  [id number]  LOCATION:  5157                         FACILITY:  [facility]  PHYSICIAN:  Clydia Llano, MD       DATE OF BIRTH:  07/28/1981  DATE OF ADMISSION:  09/22/2011 DATE OF DISCHARGE:                              DISCHARGE SUMMARY   PRIMARY CARE PHYSICIAN:  [facility].  REASON FOR ADMISSION: 1. Abdominal pain, diarrhea. 2. Recurrent acute Clostridium difficile colitis. 3. Hepatitis C. 4. History of MRC. 5. Polysubstance abuse; THC, amphetamines, and tobacco.  DISCHARGE MEDICATIONS: 1. Florastor 1 tablet p.o. b.i.d. take for 30 days. 2. Vancomycin 125 mg oral suspension take q.i.d. for 7 days, then     b.i.d. for 7 days, then daily for 7 days, then q.48 h. for 7 days,     then q.72 hours for 14 days. 3. Ibuprofen 200 mg 2 tablets every 6 hours as needed for pain. 4. Oxycodone 5/325 mg 2 tablets every 4 hours as needed for pain.  RADIOLOGY:  Abdominal x-ray on September 29th, no evidence of small bowel obstruction or free air.  BRIEF HISTORY EXAMINATION:  [name] is a 30 year old male with past medical history of C. diff and hepatitis C.  The patient was recently discharged from the hospital on September 16, 2011, with oral vancomycin taper.  The patient did have history of hepatitis C and diverticulitis. The patient said he was doing fine on the oral vancomycin until his roommate accidently thrown out his vancomycin, and subsequently, when he stopped taking the medication he developed worsening of his diarrhea and increasing abdominal pain which ended up as bloody diarrhea.  The patient came to the hospital for further evaluation.  BRIEF HOSPITAL STAY: 1. Recurrent acute C. diff colitis.  This is second recurrence.  (This     is the third time he has C. diff.)  The patient was admitted to the     hospital.  He was initially put on full liquid diet for bowel rest.     Restarted on  vancomycin and he was getting better.  His diet was     advanced.  As well as Flagyl was added at one point for 3 days.  He     was getting improved.  On the day of discharge, the patient still     had some loose stools of about 4 stools a day, but the abdominal     pain and the cramping is much better.  The patient will be     discharged on a prolonged taper of vancomycin.  There was a     question about the patient that he cannot afford his medication.     We contacted to the pharmacy, his medication would cause 120 bucks     and he said he will afford that tomorrow.  The patient provided     with tomorrow's vancomycin dosage from the pharmacy downstairs here     in the hospital and the patient said he is going to afford his     medication from tomorrow evening. 2. Polysubstance abuse.  The patient counseled extensively. 3. History of hepatitis C.  This is to follow up as outpatient.  The     patient was set up with [facility] to follow on.  DISCHARGE INSTRUCTIONS: 1. Activity as tolerated. 2. Disposition to home. 3. Diet regular. Clydia Llano, MD     ME/MEDQ  D:  09/26/2011  T:  09/26/2011  Job:  [id number]  cc:   [facility]  Electronically Signed by Clydia Llano  on 10/08/2011 01:52:50 PM

## 2011-11-10 ENCOUNTER — Encounter (HOSPITAL_COMMUNITY)
Admission: EM | Disposition: A | Discharge status: Home / Self Care | Payer: Self-pay | Source: Home / Self Care | Attending: Orthopaedic Surgery

## 2011-11-10 ENCOUNTER — Encounter (HOSPITAL_COMMUNITY): Payer: Self-pay | Admitting: Emergency Medicine

## 2011-11-10 ENCOUNTER — Encounter (HOSPITAL_COMMUNITY): Payer: Self-pay | Admitting: Anesthesiology

## 2011-11-10 ENCOUNTER — Inpatient Hospital Stay (HOSPITAL_COMMUNITY)
Admission: EM | Admit: 2011-11-10 | Discharge: 2011-11-12 | DRG: 501 | Disposition: A | Discharge status: Home / Self Care | Payer: Self-pay | Attending: Orthopaedic Surgery | Admitting: Orthopaedic Surgery

## 2011-11-10 ENCOUNTER — Emergency Department (HOSPITAL_COMMUNITY): Payer: Self-pay | Admitting: Anesthesiology

## 2011-11-10 ENCOUNTER — Other Ambulatory Visit: Payer: Self-pay

## 2011-11-10 DIAGNOSIS — L0390 Cellulitis, unspecified: Secondary | ICD-10-CM

## 2011-11-10 DIAGNOSIS — L02413 Cutaneous abscess of right upper limb: Secondary | ICD-10-CM

## 2011-11-10 DIAGNOSIS — F1210 Cannabis abuse, uncomplicated: Secondary | ICD-10-CM | POA: Diagnosis present

## 2011-11-10 DIAGNOSIS — K5730 Diverticulosis of large intestine without perforation or abscess without bleeding: Secondary | ICD-10-CM | POA: Diagnosis present

## 2011-11-10 DIAGNOSIS — K5792 Diverticulitis of intestine, part unspecified, without perforation or abscess without bleeding: Secondary | ICD-10-CM | POA: Insufficient documentation

## 2011-11-10 DIAGNOSIS — M79A19 Nontraumatic compartment syndrome of unspecified upper extremity: Principal | ICD-10-CM | POA: Diagnosis present

## 2011-11-10 DIAGNOSIS — F1510 Other stimulant abuse, uncomplicated: Secondary | ICD-10-CM | POA: Diagnosis present

## 2011-11-10 DIAGNOSIS — F1910 Other psychoactive substance abuse, uncomplicated: Secondary | ICD-10-CM | POA: Insufficient documentation

## 2011-11-10 DIAGNOSIS — F411 Generalized anxiety disorder: Secondary | ICD-10-CM | POA: Diagnosis present

## 2011-11-10 DIAGNOSIS — K529 Noninfective gastroenteritis and colitis, unspecified: Secondary | ICD-10-CM | POA: Insufficient documentation

## 2011-11-10 DIAGNOSIS — B1920 Unspecified viral hepatitis C without hepatic coma: Secondary | ICD-10-CM | POA: Diagnosis present

## 2011-11-10 DIAGNOSIS — IMO0002 Reserved for concepts with insufficient information to code with codable children: Secondary | ICD-10-CM | POA: Diagnosis present

## 2011-11-10 HISTORY — DX: Depression, unspecified: F32.A

## 2011-11-10 HISTORY — DX: Other psychoactive substance abuse, uncomplicated: F19.10

## 2011-11-10 HISTORY — PX: I & D EXTREMITY: SHX5045

## 2011-11-10 HISTORY — DX: Anxiety disorder, unspecified: F41.9

## 2011-11-10 HISTORY — DX: Diverticulitis of intestine, part unspecified, without perforation or abscess without bleeding: K57.92

## 2011-11-10 HISTORY — DX: Nicotine dependence, unspecified, uncomplicated: F17.200

## 2011-11-10 HISTORY — DX: Unspecified viral hepatitis C without hepatic coma: B19.20

## 2011-11-10 HISTORY — DX: Noninfective gastroenteritis and colitis, unspecified: K52.9

## 2011-11-10 HISTORY — DX: Major depressive disorder, single episode, unspecified: F32.9

## 2011-11-10 LAB — CBC
Platelets: 305 10*3/uL (ref 150–400)
RBC: 5.24 MIL/uL (ref 4.22–5.81)
WBC: 15.6 10*3/uL — ABNORMAL HIGH (ref 4.0–10.5)

## 2011-11-10 LAB — BASIC METABOLIC PANEL
GFR calc non Af Amer: >90 mL/min (ref >90)
Glucose, Bld: 109 mg/dL — ABNORMAL HIGH (ref 70–99)
Potassium: 3.6 mEq/L (ref 3.5–5.1)
Sodium: 135 mEq/L (ref 135–145)

## 2011-11-10 LAB — DIFFERENTIAL
Eosinophils Absolute: 0.3 10*3/uL (ref 0.0–0.7)
Lymphocytes Relative: 9 % — ABNORMAL LOW (ref 12–46)
Lymphs Abs: 1.4 10*3/uL (ref 0.7–4.0)
Neutrophils Relative %: 83 % — ABNORMAL HIGH (ref 43–77)

## 2011-11-10 LAB — MRSA PCR SCREENING: MRSA by PCR: NEGATIVE

## 2011-11-10 SURGERY — IRRIGATION AND DEBRIDEMENT EXTREMITY
Anesthesia: General | Laterality: Right

## 2011-11-10 SURGERY — IRRIGATION AND DEBRIDEMENT EXTREMITY
Anesthesia: General | Laterality: Right | Wound class: Dirty or Infected

## 2011-11-10 MED ORDER — SODIUM CHLORIDE 0.9 % IV SOLN
INTRAVENOUS | Status: DC
  Administered 2011-11-10 – 2011-11-12 (×2): via INTRAVENOUS

## 2011-11-10 MED ORDER — METOCLOPRAMIDE HCL 5 MG/ML IJ SOLN
5.0000 mg | Freq: Three times a day (TID) | INTRAMUSCULAR | Status: DC | PRN
  Filled 2011-11-10: qty 2

## 2011-11-10 MED ORDER — PROMETHAZINE HCL 25 MG/ML IJ SOLN: 6.2500 mg | INTRAMUSCULAR | Status: DC | PRN

## 2011-11-10 MED ORDER — MORPHINE SULFATE 2 MG/ML IJ SOLN: 0.0500 mg/kg | INTRAMUSCULAR | Status: DC | PRN

## 2011-11-10 MED ORDER — FENTANYL CITRATE 0.05 MG/ML IJ SOLN
INTRAMUSCULAR | Status: DC | PRN
  Administered 2011-11-10 (×2): 50 ug via INTRAVENOUS

## 2011-11-10 MED ORDER — METHOCARBAMOL 500 MG PO TABS
500.0000 mg | ORAL_TABLET | Freq: Four times a day (QID) | ORAL | Status: DC | PRN
  Administered 2011-11-10: 500 mg via ORAL
  Filled 2011-11-10 (×4): qty 1

## 2011-11-10 MED ORDER — KETOROLAC TROMETHAMINE 15 MG/ML IJ SOLN
INTRAMUSCULAR | Status: AC
  Administered 2011-11-10: 15 mg via INTRAVENOUS
  Filled 2011-11-10: qty 1

## 2011-11-10 MED ORDER — SODIUM CHLORIDE 0.9 % IR SOLN
Status: DC | PRN
  Administered 2011-11-10: 13:00:00

## 2011-11-10 MED ORDER — HYDROMORPHONE 0.3 MG/ML IV SOLN
INTRAVENOUS | Status: DC
  Administered 2011-11-10: 7.5 mg via INTRAVENOUS
  Administered 2011-11-11: 4.32 mg via INTRAVENOUS
  Administered 2011-11-11: 3.3 mg via INTRAVENOUS
  Administered 2011-11-11: 2.1 mg via INTRAVENOUS
  Administered 2011-11-11: 0.3 mg via INTRAVENOUS
  Administered 2011-11-11: 7.5 mg via INTRAVENOUS
  Administered 2011-11-11: 8.22 mg via INTRAVENOUS
  Administered 2011-11-12: 4.18 mg via INTRAVENOUS
  Administered 2011-11-12: 1.8 mg via INTRAVENOUS
  Filled 2011-11-10 (×9): qty 25

## 2011-11-10 MED ORDER — HYDROMORPHONE HCL PF 1 MG/ML IJ SOLN
INTRAMUSCULAR | Status: AC
  Filled 2011-11-10: qty 1

## 2011-11-10 MED ORDER — HYDROMORPHONE HCL PF 1 MG/ML IJ SOLN
0.2500 mg | INTRAMUSCULAR | Status: DC | PRN
  Administered 2011-11-10 (×4): 0.5 mg via INTRAVENOUS

## 2011-11-10 MED ORDER — ONDANSETRON HCL 4 MG/2ML IJ SOLN: 4.0000 mg | Freq: Four times a day (QID) | INTRAMUSCULAR | Status: DC | PRN

## 2011-11-10 MED ORDER — NICOTINE 21 MG/24HR TD PT24
21.0000 mg | MEDICATED_PATCH | Freq: Every day | TRANSDERMAL | Status: DC
  Administered 2011-11-10 – 2011-11-12 (×3): 21 mg via TRANSDERMAL
  Filled 2011-11-10 (×3): qty 1

## 2011-11-10 MED ORDER — LACTATED RINGERS IV SOLN
INTRAVENOUS | Status: DC | PRN
  Administered 2011-11-10: 12:00:00 via INTRAVENOUS

## 2011-11-10 MED ORDER — MORPHINE SULFATE (PF) 1 MG/ML IV SOLN
INTRAVENOUS | Status: DC
  Administered 2011-11-10: 17:00:00 via INTRAVENOUS

## 2011-11-10 MED ORDER — ZOLPIDEM TARTRATE 5 MG PO TABS: 5.0000 mg | ORAL_TABLET | Freq: Every evening | ORAL | Status: DC | PRN

## 2011-11-10 MED ORDER — SODIUM CHLORIDE 0.9 % IV BOLUS (SEPSIS)
1000.0000 mL | Freq: Once | INTRAVENOUS | Status: AC
  Administered 2011-11-10: 1000 mL via INTRAVENOUS

## 2011-11-10 MED ORDER — ONDANSETRON HCL 4 MG/2ML IJ SOLN
4.0000 mg | Freq: Once | INTRAMUSCULAR | Status: AC
  Administered 2011-11-10: 4 mg via INTRAVENOUS

## 2011-11-10 MED ORDER — DIPHENHYDRAMINE HCL 12.5 MG/5ML PO ELIX
12.5000 mg | ORAL_SOLUTION | ORAL | Status: DC | PRN
  Filled 2011-11-10: qty 10

## 2011-11-10 MED ORDER — ONDANSETRON HCL 4 MG/2ML IJ SOLN
INTRAMUSCULAR | Status: DC | PRN
  Administered 2011-11-10: 4 mg via INTRAVENOUS

## 2011-11-10 MED ORDER — OXYCODONE HCL 5 MG PO TABS: 5.0000 mg | ORAL_TABLET | ORAL | Status: DC | PRN

## 2011-11-10 MED ORDER — PIPERACILLIN-TAZOBACTAM 3.375 G IVPB
3.3750 g | Freq: Three times a day (TID) | INTRAVENOUS | Status: DC
  Administered 2011-11-10 – 2011-11-12 (×6): 3.375 g via INTRAVENOUS
  Filled 2011-11-10 (×8): qty 50

## 2011-11-10 MED ORDER — ONDANSETRON HCL 4 MG/2ML IJ SOLN
INTRAMUSCULAR | Status: AC
  Administered 2011-11-10: 11:00:00
  Filled 2011-11-10: qty 2

## 2011-11-10 MED ORDER — MORPHINE SULFATE 2 MG/ML IJ SOLN: 1.0000 mg | INTRAMUSCULAR | Status: DC | PRN

## 2011-11-10 MED ORDER — SODIUM CHLORIDE 0.9 % IR SOLN
Status: DC | PRN
  Administered 2011-11-10: 3000 mL

## 2011-11-10 MED ORDER — METOCLOPRAMIDE HCL 5 MG PO TABS
5.0000 mg | ORAL_TABLET | Freq: Three times a day (TID) | ORAL | Status: DC | PRN
  Filled 2011-11-10: qty 2

## 2011-11-10 MED ORDER — LORAZEPAM 2 MG/ML IJ SOLN
INTRAMUSCULAR | Status: AC
  Administered 2011-11-10: 2 mg via INTRAVENOUS
  Filled 2011-11-10: qty 1

## 2011-11-10 MED ORDER — SODIUM CHLORIDE 0.9 % IJ SOLN
INTRAMUSCULAR | Status: AC
  Filled 2011-11-10: qty 20

## 2011-11-10 MED ORDER — MEPERIDINE HCL 25 MG/ML IJ SOLN: 6.2500 mg | INTRAMUSCULAR | Status: DC | PRN

## 2011-11-10 MED ORDER — NALOXONE HCL 0.4 MG/ML IJ SOLN: 0.4000 mg | INTRAMUSCULAR | Status: DC | PRN

## 2011-11-10 MED ORDER — PROPOFOL 10 MG/ML IV EMUL
INTRAVENOUS | Status: DC | PRN
  Administered 2011-11-10: 200 mg via INTRAVENOUS

## 2011-11-10 MED ORDER — SENNOSIDES-DOCUSATE SODIUM 8.6-50 MG PO TABS
1.0000 | ORAL_TABLET | Freq: Every evening | ORAL | Status: DC | PRN
  Filled 2011-11-10: qty 1

## 2011-11-10 MED ORDER — METHOCARBAMOL 100 MG/ML IJ SOLN
500.0000 mg | Freq: Four times a day (QID) | INTRAVENOUS | Status: DC | PRN
  Filled 2011-11-10: qty 5

## 2011-11-10 MED ORDER — VANCOMYCIN HCL 1000 MG IV SOLR
1250.0000 mg | Freq: Once | INTRAVENOUS | Status: AC
  Administered 2011-11-10: 1250 mg via INTRAVENOUS
  Filled 2011-11-10: qty 1250

## 2011-11-10 MED ORDER — HYDROCODONE-ACETAMINOPHEN 5-325 MG PO TABS: 1.0000 | ORAL_TABLET | ORAL | Status: DC | PRN

## 2011-11-10 MED ORDER — HYDROMORPHONE HCL PF 1 MG/ML IJ SOLN
1.0000 mg | Freq: Once | INTRAMUSCULAR | Status: AC
  Administered 2011-11-10: 1 mg via INTRAVENOUS
  Filled 2011-11-10: qty 1

## 2011-11-10 MED ORDER — LORAZEPAM 2 MG/ML IJ SOLN
2.0000 mg | INTRAMUSCULAR | Status: DC | PRN
  Administered 2011-11-10 – 2011-11-11 (×3): 2 mg via INTRAVENOUS
  Filled 2011-11-10 (×4): qty 1

## 2011-11-10 MED ORDER — VANCOMYCIN HCL 1000 MG IV SOLR
1250.0000 mg | Freq: Two times a day (BID) | INTRAVENOUS | Status: DC
  Administered 2011-11-10 – 2011-11-12 (×4): 1250 mg via INTRAVENOUS
  Filled 2011-11-10 (×5): qty 1250

## 2011-11-10 MED ORDER — MIDAZOLAM HCL 2 MG/2ML IJ SOLN
INTRAMUSCULAR | Status: AC
  Filled 2011-11-10: qty 2

## 2011-11-10 MED ORDER — SODIUM CHLORIDE 0.9 % IJ SOLN: 9.0000 mL | INTRAMUSCULAR | Status: DC | PRN

## 2011-11-10 MED ORDER — MIDAZOLAM HCL 5 MG/5ML IJ SOLN
INTRAMUSCULAR | Status: DC | PRN
  Administered 2011-11-10: 2 mg via INTRAVENOUS

## 2011-11-10 MED ORDER — SUCCINYLCHOLINE CHLORIDE 20 MG/ML IJ SOLN
INTRAMUSCULAR | Status: DC | PRN
  Administered 2011-11-10: 100 mg via INTRAVENOUS

## 2011-11-10 MED ORDER — MIDAZOLAM HCL 2 MG/2ML IJ SOLN
0.5000 mg | Freq: Once | INTRAMUSCULAR | Status: AC | PRN
  Administered 2011-11-10: 2 mg via INTRAVENOUS

## 2011-11-10 MED ORDER — KETOROLAC TROMETHAMINE 15 MG/ML IJ SOLN
15.0000 mg | Freq: Four times a day (QID) | INTRAMUSCULAR | Status: AC
  Administered 2011-11-10 – 2011-11-11 (×3): 15 mg via INTRAVENOUS
  Filled 2011-11-10 (×3): qty 1

## 2011-11-10 MED ORDER — PIPERACILLIN-TAZOBACTAM 4.5 G IVPB
4.5000 g | Freq: Once | INTRAVENOUS | Status: AC
  Administered 2011-11-10: 4.5 g via INTRAVENOUS
  Filled 2011-11-10: qty 100

## 2011-11-10 SURGICAL SUPPLY — 58 items
BANDAGE CONFORM 3  STR LF (GAUZE/BANDAGES/DRESSINGS) IMPLANT
BANDAGE ELASTIC 3 VELCRO ST LF (GAUZE/BANDAGES/DRESSINGS) IMPLANT
BLADE SURG 10 STRL SS (BLADE) ×2 IMPLANT
BNDG COHESIVE 1X5 TAN STRL LF (GAUZE/BANDAGES/DRESSINGS) IMPLANT
BNDG COHESIVE 4X5 TAN STRL (GAUZE/BANDAGES/DRESSINGS) ×2 IMPLANT
BNDG COHESIVE 6X5 TAN STRL LF (GAUZE/BANDAGES/DRESSINGS) ×4 IMPLANT
BNDG GAUZE STRTCH 6 (GAUZE/BANDAGES/DRESSINGS) ×6 IMPLANT
CLOTH BEACON ORANGE TIMEOUT ST (SAFETY) ×2 IMPLANT
CORDS BIPOLAR (ELECTRODE) IMPLANT
COVER SURGICAL LIGHT HANDLE (MISCELLANEOUS) ×2 IMPLANT
CUFF TOURNIQUET SINGLE 18IN (TOURNIQUET CUFF) ×2 IMPLANT
CUFF TOURNIQUET SINGLE 24IN (TOURNIQUET CUFF) IMPLANT
CUFF TOURNIQUET SINGLE 34IN LL (TOURNIQUET CUFF) IMPLANT
CUFF TOURNIQUET SINGLE 44IN (TOURNIQUET CUFF) IMPLANT
DRAPE ORTHO SPLIT 77X108 STRL (DRAPES)
DRAPE SURG 17X23 STRL (DRAPES) IMPLANT
DRAPE SURG ORHT 6 SPLT 77X108 (DRAPES) IMPLANT
DRAPE U-SHAPE 47X51 STRL (DRAPES) ×2 IMPLANT
DURAPREP 26ML APPLICATOR (WOUND CARE) ×2 IMPLANT
ELECT CAUTERY BLADE 6.4 (BLADE) IMPLANT
ELECT REM PT RETURN 9FT ADLT (ELECTROSURGICAL)
ELECTRODE REM PT RTRN 9FT ADLT (ELECTROSURGICAL) IMPLANT
GAUZE SPONGE 4X4 12PLY STRL LF (GAUZE/BANDAGES/DRESSINGS) ×2 IMPLANT
GAUZE XEROFORM 1X8 LF (GAUZE/BANDAGES/DRESSINGS) ×2 IMPLANT
GAUZE XEROFORM 5X9 LF (GAUZE/BANDAGES/DRESSINGS) ×2 IMPLANT
GLOVE BIOGEL PI IND STRL 8 (GLOVE) ×2 IMPLANT
GLOVE BIOGEL PI INDICATOR 8 (GLOVE) ×2
GLOVE ORTHO TXT STRL SZ7.5 (GLOVE) ×2 IMPLANT
GOWN PREVENTION PLUS LG XLONG (DISPOSABLE) IMPLANT
GOWN PREVENTION PLUS XLARGE (GOWN DISPOSABLE) ×2 IMPLANT
GOWN STRL NON-REIN LRG LVL3 (GOWN DISPOSABLE) ×2 IMPLANT
HANDPIECE INTERPULSE COAX TIP (DISPOSABLE) ×1
KIT BASIN OR (CUSTOM PROCEDURE TRAY) ×2 IMPLANT
KIT ROOM TURNOVER OR (KITS) ×2 IMPLANT
MANIFOLD NEPTUNE II (INSTRUMENTS) ×2 IMPLANT
NS IRRIG 1000ML POUR BTL (IV SOLUTION) ×2 IMPLANT
PACK ORTHO EXTREMITY (CUSTOM PROCEDURE TRAY) ×2 IMPLANT
PAD ARMBOARD 7.5X6 YLW CONV (MISCELLANEOUS) ×2 IMPLANT
PADDING CAST ABS 4INX4YD NS (CAST SUPPLIES) ×2
PADDING CAST ABS COTTON 4X4 ST (CAST SUPPLIES) ×2 IMPLANT
PADDING CAST COTTON 6X4 STRL (CAST SUPPLIES) ×2 IMPLANT
PADDING WEBRIL 4 STERILE (GAUZE/BANDAGES/DRESSINGS) ×4 IMPLANT
SET HNDPC FAN SPRY TIP SCT (DISPOSABLE) ×1 IMPLANT
SPONGE GAUZE 4X4 12PLY (GAUZE/BANDAGES/DRESSINGS) ×2 IMPLANT
SPONGE LAP 18X18 X RAY DECT (DISPOSABLE) ×2 IMPLANT
STOCKINETTE IMPERVIOUS 9X36 MD (GAUZE/BANDAGES/DRESSINGS) ×2 IMPLANT
SUT ETHILON 2 0 FS 18 (SUTURE) ×10 IMPLANT
SUT ETHILON 3 0 PS 1 (SUTURE) IMPLANT
SWAB CULTURE LIQ STUART DBL (MISCELLANEOUS) ×2 IMPLANT
SYR CONTROL 10ML LL (SYRINGE) IMPLANT
TOWEL OR 17X24 6PK STRL BLUE (TOWEL DISPOSABLE) ×2 IMPLANT
TOWEL OR 17X26 10 PK STRL BLUE (TOWEL DISPOSABLE) ×2 IMPLANT
TUBE ANAEROBIC SPECIMEN COL (MISCELLANEOUS) ×2 IMPLANT
TUBE CONNECTING 12X1/4 (SUCTIONS) ×2 IMPLANT
TUBE FEEDING 5FR 15 INCH (TUBING) IMPLANT
UNDERPAD 30X30 INCONTINENT (UNDERPADS AND DIAPERS) ×2 IMPLANT
WATER STERILE IRR 1000ML POUR (IV SOLUTION) ×2 IMPLANT
YANKAUER SUCT BULB TIP NO VENT (SUCTIONS) ×2 IMPLANT

## 2011-11-10 SURGICAL SUPPLY — 55 items
BANDAGE CONFORM 3  STR LF (GAUZE/BANDAGES/DRESSINGS) IMPLANT
BANDAGE ELASTIC 3 VELCRO ST LF (GAUZE/BANDAGES/DRESSINGS) IMPLANT
BLADE SURG 10 STRL SS (BLADE) ×2 IMPLANT
BNDG COHESIVE 1X5 TAN STRL LF (GAUZE/BANDAGES/DRESSINGS) IMPLANT
BNDG COHESIVE 4X5 TAN STRL (GAUZE/BANDAGES/DRESSINGS) ×2 IMPLANT
BNDG COHESIVE 6X5 TAN STRL LF (GAUZE/BANDAGES/DRESSINGS) ×4 IMPLANT
BNDG GAUZE STRTCH 6 (GAUZE/BANDAGES/DRESSINGS) ×6 IMPLANT
CLOTH BEACON ORANGE TIMEOUT ST (SAFETY) ×2 IMPLANT
CORDS BIPOLAR (ELECTRODE) IMPLANT
COVER SURGICAL LIGHT HANDLE (MISCELLANEOUS) ×2 IMPLANT
CUFF TOURNIQUET SINGLE 18IN (TOURNIQUET CUFF) ×2 IMPLANT
CUFF TOURNIQUET SINGLE 24IN (TOURNIQUET CUFF) IMPLANT
CUFF TOURNIQUET SINGLE 34IN LL (TOURNIQUET CUFF) IMPLANT
CUFF TOURNIQUET SINGLE 44IN (TOURNIQUET CUFF) IMPLANT
DRAPE ORTHO SPLIT 77X108 STRL (DRAPES) ×2
DRAPE SURG 17X23 STRL (DRAPES) IMPLANT
DRAPE SURG ORHT 6 SPLT 77X108 (DRAPES) ×2 IMPLANT
DRAPE U-SHAPE 47X51 STRL (DRAPES) ×2 IMPLANT
DURAPREP 26ML APPLICATOR (WOUND CARE) ×2 IMPLANT
ELECT CAUTERY BLADE 6.4 (BLADE) IMPLANT
ELECT REM PT RETURN 9FT ADLT (ELECTROSURGICAL)
ELECTRODE REM PT RTRN 9FT ADLT (ELECTROSURGICAL) IMPLANT
GAUZE XEROFORM 1X8 LF (GAUZE/BANDAGES/DRESSINGS) ×2 IMPLANT
GLOVE BIOGEL PI IND STRL 8 (GLOVE) ×2 IMPLANT
GLOVE BIOGEL PI INDICATOR 8 (GLOVE) ×2
GLOVE ORTHO TXT STRL SZ7.5 (GLOVE) ×2 IMPLANT
GOWN PREVENTION PLUS LG XLONG (DISPOSABLE) IMPLANT
GOWN PREVENTION PLUS XLARGE (GOWN DISPOSABLE) ×2 IMPLANT
GOWN STRL NON-REIN LRG LVL3 (GOWN DISPOSABLE) ×2 IMPLANT
HANDPIECE INTERPULSE COAX TIP (DISPOSABLE)
KIT BASIN OR (CUSTOM PROCEDURE TRAY) ×2 IMPLANT
KIT ROOM TURNOVER OR (KITS) ×2 IMPLANT
MANIFOLD NEPTUNE II (INSTRUMENTS) ×2 IMPLANT
NS IRRIG 1000ML POUR BTL (IV SOLUTION) ×2 IMPLANT
PACK ORTHO EXTREMITY (CUSTOM PROCEDURE TRAY) ×2 IMPLANT
PAD ARMBOARD 7.5X6 YLW CONV (MISCELLANEOUS) ×2 IMPLANT
PADDING CAST ABS 4INX4YD NS (CAST SUPPLIES) ×2
PADDING CAST ABS COTTON 4X4 ST (CAST SUPPLIES) ×2 IMPLANT
PADDING CAST COTTON 6X4 STRL (CAST SUPPLIES) ×2 IMPLANT
SET HNDPC FAN SPRY TIP SCT (DISPOSABLE) IMPLANT
SPONGE GAUZE 4X4 12PLY (GAUZE/BANDAGES/DRESSINGS) ×2 IMPLANT
SPONGE LAP 18X18 X RAY DECT (DISPOSABLE) ×2 IMPLANT
STOCKINETTE IMPERVIOUS 9X36 MD (GAUZE/BANDAGES/DRESSINGS) ×2 IMPLANT
SUT ETHILON 2 0 FS 18 (SUTURE) ×6 IMPLANT
SUT ETHILON 3 0 PS 1 (SUTURE) ×4 IMPLANT
SWAB CULTURE LIQ STUART DBL (MISCELLANEOUS) IMPLANT
SYR CONTROL 10ML LL (SYRINGE) IMPLANT
TOWEL OR 17X24 6PK STRL BLUE (TOWEL DISPOSABLE) ×2 IMPLANT
TOWEL OR 17X26 10 PK STRL BLUE (TOWEL DISPOSABLE) ×2 IMPLANT
TUBE ANAEROBIC SPECIMEN COL (MISCELLANEOUS) IMPLANT
TUBE CONNECTING 12X1/4 (SUCTIONS) ×2 IMPLANT
TUBE FEEDING 5FR 15 INCH (TUBING) IMPLANT
UNDERPAD 30X30 INCONTINENT (UNDERPADS AND DIAPERS) ×2 IMPLANT
WATER STERILE IRR 1000ML POUR (IV SOLUTION) ×2 IMPLANT
YANKAUER SUCT BULB TIP NO VENT (SUCTIONS) ×2 IMPLANT

## 2011-11-10 NOTE — Preoperative (Signed)
Beta Blockers   Reason not to administer Beta Blockers:Not Applicable 

## 2011-11-10 NOTE — ED Notes (Signed)
SEEN BY dR. BLACKMAN. Informed consent signed by patient.

## 2011-11-10 NOTE — Consult Note (Signed)
PCP:  Provider Not In System   DOA:  11/10/2011  9:37 AM  Referring physician: Dr. Doneen Poisson. Reason for consultation: Management of medical problems.  HPI: This is a 30 years old man with history of polysubstance abuse, presented to the ED today with right forearm pain and swelling after injecting drugs. He rated the pain as 10 out of 10. Patient was found to have significant right arm cellulitis and abscess he was evaluated by surgery in the ER and was taken to the OR for debridement. We are   consulted  to help with the management of his medical  problems. He was given vancomycin and Zosyn in the ED. Patient seen and examined ,S/P RUE I&D,C/O sever pain 10/10 .   Allergies: No Known Allergies  Prior to Admission medications   Not on File    Past Medical History  Diagnosis Date  . Hepatitis C   . Diverticulitis   . Colitis   . Drug abuse   . Anxiety      Social History: Lives with a friend,smokes 1/2 pack of cigarettes daily for 18 years ,inject amphetamine and smokes mariajuana,denies use of heroine or cocaine .    Reviw of Systems:  Constitutional: Denies fever, chills, diaphoresis, appetite change and fatigue.  HEENT: Denies photophobia, eye pain, redness, hearing loss, ear pain, congestion, sore throat, rhinorrhea, sneezing, mouth sores, trouble swallowing, neck pain, neck stiffness and tinnitus.   Respiratory: Denies SOB, DOE, cough, chest tightness,  and wheezing.   Cardiovascular: Denies chest pain, palpitations and leg swelling.  Gastrointestinal: Denies nausea, vomiting, abdominal pain, diarrhea, constipation, blood in stool and abdominal distention.  Genitourinary: Denies dysuria, urgency, frequency, hematuria, flank pain and difficulty urinating.  Musculoskeletal: RUE pain and swellingNeurological: Denies dizziness, seizures, syncope, weakness, light-headedness, numbness and headaches.     Physical Exam:  Filed Vitals:   11/10/11 0936 11/10/11  1100  BP: 160/90 119/93  Pulse: 114   Temp: 99 F (37.2 C)   TempSrc: Oral   Resp: 20   Height: 5\' 9"  (1.753 m)   Weight: 77.111 kg (170 lb)   SpO2: 98%     Constitutional: Vital signs reviewed.  Patient is a well-developed and well-nourished in moderate acute distress and cooperative with exam. Alert and oriented x3.  Neck: Supple, Trachea midline normal ROM, No JVD,  Cardiovascular: RRR, S1 normal, S2 normal, no MRG, pulses symmetric and intact bilaterally Pulmonary/Chest: CTAB, no wheezes, rales, or rhonchi Abdominal: Soft. Non-tender, non-distended, bowel sounds are normal, no masses, organomegaly, or guarding present.  GU: no CVA tenderness Ext: no edema and no cyanosis, pulses palpable bilaterally (DP and PT),RUE on dressing S/P I&D. Neurological: A&O x3, nonfocal Labs on Admission:  Results for orders placed during the hospital encounter of 11/10/11 (from the past 48 hour(s))  CBC     Status: Abnormal   Collection Time   11/10/11 10:35 AM      Component Value Range Comment   WBC 15.6 (*) 4.0 - 10.5 (K/uL)    RBC 5.24  4.22 - 5.81 (MIL/uL)    Hemoglobin 15.7  13.0 - 17.0 (g/dL)    HCT 16.1  09.6 - 04.5 (%)    MCV 82.4  78.0 - 100.0 (fL)    MCH 30.0  26.0 - 34.0 (pg)    MCHC 36.3 (*) 30.0 - 36.0 (g/dL)    RDW 40.9  81.1 - 91.4 (%)    Platelets 305  150 - 400 (K/uL)   DIFFERENTIAL  Status: Abnormal   Collection Time   11/10/11 10:35 AM      Component Value Range Comment   Neutrophils Relative 83 (*) 43 - 77 (%)    Neutro Abs 13.0 (*) 1.7 - 7.7 (K/uL)    Lymphocytes Relative 9 (*) 12 - 46 (%)    Lymphs Abs 1.4  0.7 - 4.0 (K/uL)    Monocytes Relative 6  3 - 12 (%)    Monocytes Absolute 1.0  0.1 - 1.0 (K/uL)    Eosinophils Relative 2  0 - 5 (%)    Eosinophils Absolute 0.3  0.0 - 0.7 (K/uL)    Basophils Relative 0  0 - 1 (%)    Basophils Absolute 0.0  0.0 - 0.1 (K/uL)   BASIC METABOLIC PANEL     Status: Abnormal   Collection Time   11/10/11 10:35 AM       Component Value Range Comment   Sodium 135  135 - 145 (mEq/L)    Potassium 3.6  3.5 - 5.1 (mEq/L)    Chloride 97  96 - 112 (mEq/L)    CO2 24  19 - 32 (mEq/L)    Glucose, Bld 109 (*) 70 - 99 (mg/dL)    BUN 14  6 - 23 (mg/dL)    Creatinine, Ser 2.95  0.50 - 1.35 (mg/dL)    Calcium 9.3  8.4 - 10.5 (mg/dL)    GFR calc non Af Amer >90  >90 (mL/min)    GFR calc Af Amer >90  >90 (mL/min)     Radiological Exams on Admission: No results found.  Assessment/Plan  Right upper extremity cellulitis Mrs. necrotizing fasciitis. Status post I&D Continue with vancomycin and Zosyn, follow cultures results. Pain control and wound care. History of recurrent C. difficile colitis colitis Last treated on September 2012. Start  florastor 1 tab bid while  on broad-spectrum antibiotics. Watch for diarrhea. Polysubstance abuse. Social worker consult/counseling History of hepatitis C To be addressed as an outpatient.  Time Spent on consultation 35 minutes  Chantale Leugers 11/10/2011, 1:03 PM

## 2011-11-10 NOTE — Anesthesia Procedure Notes (Signed)
Procedure Name: Intubation Date/Time: 11/10/2011 12:03 PM Performed by: Margaree Mackintosh Pre-anesthesia Checklist: Patient identified, Emergency Drugs available, Suction available, Patient being monitored and Timeout performed Patient Re-evaluated:Patient Re-evaluated prior to inductionOxygen Delivery Method: Circle System Utilized Preoxygenation: Pre-oxygenation with 100% oxygen Intubation Type: IV induction, Rapid sequence and Circoid Pressure applied Laryngoscope Size: Mac and 3 Grade View: Grade I Tube type: Oral Tube size: 8.0 mm Number of attempts: 1 Airway Equipment and Method: stylet Placement Confirmation: ETT inserted through vocal cords under direct vision,  positive ETCO2 and breath sounds checked- equal and bilateral Secured at: 22 cm Tube secured with: Tape Dental Injury: Teeth and Oropharynx as per pre-operative assessment

## 2011-11-10 NOTE — ED Provider Notes (Signed)
History     CSN: [id number] Arrival date & time: 11/10/2011  9:37 AM   First MD Initiated Contact with Patient 11/10/11 1011      No chief complaint on file.   (Consider location/radiation/quality/duration/timing/severity/associated sxs/prior treatment) HPI Comments: Patient injected amphetamines into his right a.c. and right wrist. He has had rapidly worsening infection to his right upper extremity. States that he uses the same needle for all of his injections.  Patient is a 30 y.o. male presenting with rash. The history is provided by the patient. No language interpreter was used.  Rash  This is a new problem. The current episode started 6 to 12 hours ago. The problem has been rapidly worsening. Associated with: Injection of IV drugs. There has been no fever. The rash is present on the right hand, right wrist and right arm. The pain is at a severity of 10/10. The pain is severe. The pain has been constant since onset. Associated symptoms include pain. He has tried nothing for the symptoms. The treatment provided no relief.    Past Medical History  Diagnosis Date  . Hepatitis C   . Diverticulitis   . Colitis   . Drug abuse   . Anxiety     History reviewed. No pertinent past surgical history.  History reviewed. No pertinent family history.  History  Substance Use Topics  . Smoking status: Not on file  . Smokeless tobacco: Not on file  . Alcohol Use:       Review of Systems  Constitutional: Positive for fatigue. Negative for fever, chills, activity change and appetite change.  HENT: Negative for congestion, sore throat, rhinorrhea, neck pain and neck stiffness.   Respiratory: Negative for cough, chest tightness and shortness of breath.   Cardiovascular: Negative for chest pain and palpitations.  Gastrointestinal: Negative for nausea, vomiting and abdominal pain.  Genitourinary: Negative for dysuria, urgency, frequency and flank pain.  Musculoskeletal: Negative for back  pain and arthralgias.  Skin: Positive for rash and wound.  Neurological: Negative for dizziness, weakness, light-headedness, numbness and headaches.  All other systems reviewed and are negative.    Allergies  Review of patient's allergies indicates no known allergies.  Home Medications  No current outpatient prescriptions on file.  BP 119/93  Pulse 114  Temp(Src) 99 F (37.2 C) (Oral)  Resp 20  Ht 5\' 9"  (1.753 m)  Wt 170 lb (77.111 kg)  BMI 25.10 kg/m2  SpO2 98%  Physical Exam  Nursing note and vitals reviewed. Constitutional: He is oriented to person, place, and time. He appears well-developed and well-nourished. He appears distressed.  HENT:  Head: Normocephalic and atraumatic.  Mouth/Throat: Oropharynx is clear and moist.  Eyes: Conjunctivae and EOM are normal. Pupils are equal, round, and reactive to light.  Neck: Normal range of motion. Neck supple.  Cardiovascular: Regular rhythm, normal heart sounds and intact distal pulses.        Tachycardic rate  Pulmonary/Chest: Effort normal and breath sounds normal. No respiratory distress.  Abdominal: Soft. Bowel sounds are normal. There is no tenderness.  Musculoskeletal: Normal range of motion. He exhibits tenderness.       Severe pain when moving his fingers on his right upper ext  Neurological: He is alert and oriented to person, place, and time.       Sensation intact in the right upper extremity  Skin: Skin is warm and dry.       The right upper extremity is significantly erythematous, swollen. The forearm compartments  are taught. He has severe pain with range of motion distally. Given this I am concerned about the possibility of necrotizing fasciitis. He has track marks at the right antecubital fossa as well as the right wrist    ED Course  Procedures (including critical care time)   Date: 11/10/2011  Rate: 107  Rhythm: sinus tachycardia  QRS Axis: left  Intervals: normal  ST/T Wave abnormalities: early  repolarization  Conduction Disutrbances:none  Narrative Interpretation:   Old EKG Reviewed: none available  Labs Reviewed  CBC - Abnormal; Notable for the following:    WBC 15.6 (*)    MCHC 36.3 (*)    All other components within normal limits  DIFFERENTIAL - Abnormal; Notable for the following:    Neutrophils Relative 83 (*)    Neutro Abs 13.0 (*)    Lymphocytes Relative 9 (*)    All other components within normal limits  BASIC METABOLIC PANEL - Abnormal; Notable for the following:    Glucose, Bld 109 (*)    All other components within normal limits  CULTURE, BLOOD (ROUTINE X 2)  CULTURE, BLOOD (ROUTINE X 2)   No results found.   1. Necrotizing cellulitis       MDM  Given the rapidly worsening right upper terminate infection and severe pain with motion, erythema, warmth, swelling, I am concerned for necrotizing infection versus cellulitis with abscess. I initially consult general surgery however given the presence of the area on the forearm he recommended hand surgery. I consult with hand surgery who evaluated the patient was taken to the operating room. Her broad spectrum antibiotics vancomycin and Zosyn to cover for the possibility of necrotizing fasciitis. Administered IV fluids, pain control and blood work was drawn.        Dayton Bailiff, MD 11/10/11 [phone]

## 2011-11-10 NOTE — Anesthesia Postprocedure Evaluation (Signed)
  Anesthesia Post-op Note  Patient: [name]  Procedure(s) Performed:  IRRIGATION AND DEBRIDEMENT EXTREMITY - with volar compartment release  Patient Location: PACU  Anesthesia Type: MAC and General  Level of Consciousness: awake  Airway and Oxygen Therapy: Patient Spontanous Breathing  Post-op Pain: mild  Post-op Assessment: Post-op Vital signs reviewed  Post-op Vital Signs: stable  Complications: No apparent anesthesia complications

## 2011-11-10 NOTE — ED Notes (Signed)
Pt crying during triage; states grandmother died 3 days ago. Expressing regrets re: drug use and asking for outpatient treatment.

## 2011-11-10 NOTE — Anesthesia Preprocedure Evaluation (Deleted)
Anesthesia Evaluation  Patient identified by MRN, date of birth, ID band Patient awake    Reviewed: Allergy & Precautions, H&P , NPO status , Patient's Chart, lab work & pertinent test results, reviewed documented beta blocker date and time   Airway Mallampati: I      Dental  (+)    Pulmonary    Pulmonary exam normal       Cardiovascular     Neuro/Psych    GI/Hepatic (+) Hepatitis -, C  Endo/Other    Renal/GU      Musculoskeletal   Abdominal   Peds  Hematology   Anesthesia Other Findings   Reproductive/Obstetrics                           Anesthesia Physical Anesthesia Plan  ASA: III  Anesthesia Plan: General   Post-op Pain Management:    Induction: Intravenous  Airway Management Planned: Oral ETT  Additional Equipment:   Intra-op Plan:   Post-operative Plan: Extubation in OR  Informed Consent: I have reviewed the patients History and Physical, chart, labs and discussed the procedure including the risks, benefits and alternatives for the proposed anesthesia with the patient or authorized representative who has indicated his/her understanding and acceptance.   Dental advisory given  Plan Discussed with: CRNA, Anesthesiologist and Surgeon  Anesthesia Plan Comments:         Anesthesia Quick Evaluation

## 2011-11-10 NOTE — ED Notes (Signed)
Pt in Sinus Tach, no ectopy noted.

## 2011-11-10 NOTE — Plan of Care (Signed)
Problem: Phase I Progression Outcomes Goal: Pain controlled with appropriate interventions Outcome: Progressing After multiple switches pain appears to be more controlled

## 2011-11-10 NOTE — ED Notes (Signed)
To OR via stretcher with monitor tech.

## 2011-11-10 NOTE — Transfer of Care (Signed)
Immediate Anesthesia Transfer of Care Note  Patient: [name]  Procedure(s) Performed:  IRRIGATION AND DEBRIDEMENT EXTREMITY - with volar compartment release  Patient Location: PACU  Anesthesia Type: General  Level of Consciousness: awake  Airway & Oxygen Therapy: Patient Spontanous Breathing  Post-op Assessment: Report given to PACU RN, Post -op Vital signs reviewed and stable and Patient moving all extremities  Post vital signs: Reviewed and stable  Complications: No apparent anesthesia complications

## 2011-11-10 NOTE — Anesthesia Preprocedure Evaluation (Addendum)
Anesthesia Evaluation  Patient identified by MRN, date of birth, ID band Patient awake    Reviewed: Allergy & Precautions, H&P , NPO status , Patient's Chart, lab work & pertinent test results  Airway Mallampati: I      Dental  (+) Dental Advisory Given   Pulmonary Current Smoker,          Cardiovascular     Neuro/Psych    GI/Hepatic (+) Hepatitis -, C  Endo/Other    Renal/GU      Musculoskeletal   Abdominal   Peds  Hematology   Anesthesia Other Findings   Reproductive/Obstetrics                          Anesthesia Physical Anesthesia Plan  ASA: III  Anesthesia Plan: General   Post-op Pain Management:    Induction: Intravenous  Airway Management Planned: Oral ETT  Additional Equipment:   Intra-op Plan:   Post-operative Plan: Extubation in OR  Informed Consent: I have reviewed the patients History and Physical, chart, labs and discussed the procedure including the risks, benefits and alternatives for the proposed anesthesia with the patient or authorized representative who has indicated his/her understanding and acceptance.   Dental advisory given  Plan Discussed with: CRNA and Surgeon  Anesthesia Plan Comments:        Anesthesia Quick Evaluation Patient unsure of NPO status. Ate gummy bears....unknown time.

## 2011-11-10 NOTE — ED Notes (Signed)
EKG done on arrival and given to Dr. Brooke Dare. No old

## 2011-11-10 NOTE — Progress Notes (Signed)
ANTIBIOTIC CONSULT NOTE - INITIAL  Pharmacy Consult for Vancomycin/Zosyn  Indication: R forearm Abscess  No Known Allergies  Patient Measurements: Height: 5\' 9"  (175.3 cm) Weight: 156 lb 4.9 oz (70.9 kg) IBW/kg (Calculated) : 70.7     Vital Signs: Temp: 98.5 F (36.9 C) (11/17 1516) Temp src: Oral (11/17 1516) BP: 97/50 mmHg (11/17 1516) Pulse Rate: 87  (11/17 1700) Intake/Output from previous day:   Intake/Output from this shift:    Labs:  Basename 11/10/11 1035  WBC 15.6*  HGB 15.7  PLT 305  LABCREA --  CREATININE 0.69   Estimated Creatinine Clearance: 133.8 ml/min (by C-G formula based on Cr of 0.69). No results found for this basename: VANCOTROUGH:2,VANCOPEAK:2,VANCORANDOM:2,GENTTROUGH:2,GENTPEAK:2,GENTRANDOM:2,TOBRATROUGH:2,TOBRAPEAK:2,TOBRARND:2,AMIKACINPEAK:2,AMIKACINTROU:2,AMIKACIN:2, in the last 72 hours   Microbiology: Recent Results (from the past 720 hour(s))  MRSA PCR SCREENING     Status: Normal   Collection Time   11/10/11  4:44 PM      Component Value Range Status Comment   MRSA by PCR NEGATIVE  NEGATIVE  Final     Medical History: Past Medical History  Diagnosis Date  . Hepatitis C   . Diverticulitis   . Colitis   . Drug abuse   . Anxiety     Medications:  No prescriptions prior to admission   Scheduled:    . HYDROmorphone      .  HYDROmorphone (DILAUDID) injection  1 mg Intravenous Once  . HYDROmorphone PCA 0.3 mg/mL   Intravenous Q4H  . ketorolac  15 mg Intravenous Q6H  . midazolam      . morphine   Intravenous Q4H  . nicotine  21 mg Transdermal Daily  . ondansetron      . ondansetron  4 mg Intravenous Once  . piperacillin-tazobactam (ZOSYN)  IV  4.5 g Intravenous Once  . sodium chloride  1,000 mL Intravenous Once  . sodium chloride      . vancomycin  1,250 mg Intravenous Once   Assessment: Patient admitted with R forearm pain/swelling from IV drug injection. Pt now s/p I & D of R arm abscess. Noted patient received Vanc  1250mg  IV and Zosyn 4.5gm IV at ~noon today. Baseline SCr WNL, noted WBC elevated.   Goal of Therapy:  Vancomycin trough level 10-15 mcg/ml  Plan:  1. Zosyn 3.375gm IV q 8 hours, infuse each dose over 4 hours. 2. Vancomycin 1250mg  IV q 12, start at 2300 tonight 3. Will monitor cx/sens, renal function and clinical staus daily along with you.  Tabbatha Bordelon K. Allena Katz, PharmD, BCPS.  Clinical Pharmacist Pager [phone]. 11/10/2011 8:04 PM

## 2011-11-10 NOTE — Op Note (Signed)
[name], [name]               ACCOUNT NO.:  [id number]  MEDICAL RECORD NO.:  [id number]  LOCATION:  [facility]                         FACILITY:  [facility]  PHYSICIAN:  Vanita Panda. Magnus Ivan, M.D.DATE OF BIRTH:  March 13, 1981  DATE OF PROCEDURE:  11/10/2011 DATE OF DISCHARGE:                              OPERATIVE REPORT   PREOPERATIVE DIAGNOSIS:  Right arm infection with cellulitis and questionable abscess as well as involving compartment syndrome from IV drug abuse.  POSTOPERATIVE DIAGNOSIS:  Right arm infection with cellulitis and questionable abscess as well as involving compartment syndrome from IV drug abuse.  PROCEDURES: 1. Irrigation and debridement of right forearm antecubital fossa and     dorsal wrist. 2. Release of right volar forearm compartments.  SURGEON:  Vanita Panda. Magnus Ivan, MD  ANESTHESIA:  General.  BLOOD LOSS:  Minimal.  TOURNIQUET TIME:  Less than an hour.  ANTIBIOTICS:  Vancomycin IV and Zosyn IV.  CULTURES:  Pending.  COMPLICATIONS:  None.  INDICATIONS:  [name] is a 30 year old IV drug abuser who presented to the emergency room today with worsening right arm pain in his antecubital fossa and wrist where he had been injecting amphetamines.  He was tachycardic as well as becoming hypotensive.  We recommended an urgent exploration of the right forearm antecubital fossa with irrigation and debridement as needed and compartment release as needed.  The compartments were tight on exam.  He was having numbness and tingling in his hand, and severe pain on passive stretch.  DESCRIPTION OF PROCEDURE:  After informed consent was obtained, appropriate right arm was marked, he was brought to the operative room, placed supine on the operating table with the right arm on arm table. General anesthesia had been obtained.  His right arm was prepped and draped from the axilla down to the fingers with DuraPrep and sterile drapes.  A time-out was called to identify  correct patient, correct right arm.  I then placed a sterile tourniquet on his right arm and inflated to 250 mm of pressure.  First, I made an incision in the antecubital fossa starting medially and across the antecubital fossa in the lateral.  I took this incision all the way down the forearm.  I found abundant edematous fluid, but no frank abscess.  Cultures were sent.  I released tissue in the antecubital fossa as well as the volar forearm and compartments, and cauterized any kind of bleeding vessels and stimulated and muscles and they were all contractile throughout the volar forearm compartment and the biceps.  I then used 2 L normal saline solution using pulsatile lavage to lavage the soft tissue and deep tissue around the volar forearm compartment and the antecubital fossa. I then made incision on the dorsum of the wrist where another site was injected and found edema in this area as well.  We cleaned with pulsatile lavage.  Next, I used "bug juice," which is bacitracin solution and irrigated this in all open wounds.  I then reapproximated the deep tissue with 0 Vicryl, followed by some loose 2-0 Vicryl in subcutaneous tissue, and interrupted 2-0 nylon on normal skin incisions. Xeroform followed by well-padded sterile dressing was applied.  I let the tourniquet  down.  His fingers were pinked nicely.  He had good pulses in his wrist.  He was awakened, extubated, and taken to recovery room in stable condition.     Vanita Panda. Magnus Ivan, M.D.     CYB/MEDQ  D:  11/10/2011  T:  11/10/2011  Job:  [id number]

## 2011-11-10 NOTE — H&P (Signed)
[name] is an 30 y.o. male.   Chief Complaint: right forearm pain after injecting drugs HPI: 31 yo male with Hep C who is also IV drug abuser.  Injected himself in right arm with drugs using a dirty needle.  Brought to ER today with signs/symptoms of right arm cellulitis and abscess.  Ortho called urgently to assess due to questionable necrotizing fasciitis.  Patient complains of severe right upper extremity pain.  Past Medical History  Diagnosis Date  . Hepatitis C   . Diverticulitis   . Colitis   . Drug abuse     No past surgical history on file.  No family history on file. Social History:  does not have a smoking history on file. He does not have any smokeless tobacco history on file. He reports that he uses illicit drugs (Marijuana and Methamphetamines) about once per week. His alcohol history not on file.  Allergies: No Known Allergies  Medications Prior to Admission  Medication Dose Route Frequency Provider Last Rate Last Dose  . HYDROmorphone (DILAUDID) injection 1 mg  1 mg Intravenous Once Dayton Bailiff, MD   1 mg at 11/10/11 1050  . ondansetron (ZOFRAN) injection 4 mg  4 mg Intravenous Once Dayton Bailiff, MD      . piperacillin-tazobactam (ZOSYN) IVPB 4.5 g  4.5 g Intravenous Once Dayton Bailiff, MD      . sodium chloride 0.9 % bolus 1,000 mL  1,000 mL Intravenous Once Dayton Bailiff, MD   1,000 mL at 11/10/11 1050  . vancomycin (VANCOCIN) 1,250 mg in sodium chloride 0.9 % 250 mL IVPB  1,250 mg Intravenous Once Dayton Bailiff, MD       No current outpatient prescriptions on file as of 11/10/2011.    No results found for this or any previous visit (from the past 48 hour(s)). No results found.  Review of Systems  Constitutional: Positive for fever and chills.  HENT: Negative.   Eyes: Negative.   Respiratory: Negative.   Cardiovascular: Negative.   Gastrointestinal: Positive for nausea and vomiting.  Genitourinary: Negative.   Musculoskeletal: Positive for myalgias and joint  pain.  Skin: Negative.   Neurological: Negative.   Endo/Heme/Allergies: Negative.   Psychiatric/Behavioral: Positive for substance abuse.    Blood pressure 160/90, pulse 114, temperature 99 F (37.2 C), temperature source Oral, resp. rate 20, height 5\' 9"  (1.753 m), weight 77.111 kg (170 lb), SpO2 98.00%.  Current systolic BP 90's Physical Exam  Constitutional: He appears well-developed and well-nourished. He has a sickly appearance.  HENT:  Head: Normocephalic and atraumatic.  Neck: Normal range of motion. Neck supple.  Cardiovascular: Regular rhythm.  Tachycardia present.   Respiratory: Effort normal and breath sounds normal.  GI: Soft. Normal appearance and bowel sounds are normal.  Musculoskeletal:       Right elbow: He exhibits swelling. tenderness found.       Right forearm: He exhibits tenderness, swelling and edema.  Obvious cellulitis and abscess right anticubital fossa and dorsal wrist.  Warm. Compartments full. Severe pain on passive stretch.  Assessment/Plan Obvious infection and cellulitis/abscess right arm.  Worrisome for necrotizing fasciitis 1) to OR urgently for I&D.  Terriann Difonzo Y 11/10/2011, 11:09 AM

## 2011-11-10 NOTE — ED Notes (Signed)
Pt injected "ice" amphetamines in right ac and right wrist IV yesterday morning. In mid afternoon began noticing swelling on right forearm, gradually worsening. Emesis of bile colored liquid X 6 times since 5am today. Uses same needle over again but single user needle. Pt with ST at rate 130 with occ isolated PVCs.

## 2011-11-10 NOTE — Brief Op Note (Signed)
11/10/2011  1:29 PM  PATIENT:  [name]  30 y.o. male  PRE-OPERATIVE DIAGNOSIS:  Right forearm abscess  POST-OPERATIVE DIAGNOSIS:  right forearm abscess  PROCEDURE:  Procedure(s): IRRIGATION AND DEBRIDEMENT EXTREMITY  SURGEON:  Surgeon(s): Kathryne Hitch  PHYSICIAN ASSISTANT:   ASSISTANTS: none   ANESTHESIA:   general  EBL:  Total I/O In: 1700 [I.V.:1700] Out: -   BLOOD ADMINISTERED:none  DRAINS: none   LOCAL MEDICATIONS USED:  NONE  SPECIMEN:  No Specimen  DISPOSITION OF SPECIMEN:  N/A  COUNTS:  YES  TOURNIQUET:   Total Tourniquet Time Documented: Upper Arm (Right) - 51 minutes  DICTATION: .Other Dictation: Dictation Number [id number]  PLAN OF CARE: Admit to inpatient   PATIENT DISPOSITION:  PACU - hemodynamically stable.   Delay start of Pharmacological VTE agent (>24hrs) due to surgical blood loss or risk of bleeding:  {YES/NO/NOT APPLICABLE:20182

## 2011-11-11 ENCOUNTER — Encounter (HOSPITAL_COMMUNITY): Payer: Self-pay | Admitting: *Deleted

## 2011-11-11 LAB — CBC
HCT: 32.6 % — ABNORMAL LOW (ref 39.0–52.0)
Hemoglobin: 11.4 g/dL — ABNORMAL LOW (ref 13.0–17.0)
MCH: 29.7 pg (ref 26.0–34.0)
MCHC: 35 g/dL (ref 30.0–36.0)

## 2011-11-11 MED ORDER — SACCHAROMYCES BOULARDII 250 MG PO CAPS
250.0000 mg | ORAL_CAPSULE | Freq: Two times a day (BID) | ORAL | Status: DC
  Administered 2011-11-11 – 2011-11-12 (×2): 250 mg via ORAL
  Filled 2011-11-11 (×3): qty 1

## 2011-11-11 MED ORDER — LACTATED RINGERS IV SOLN: INTRAVENOUS | Status: DC

## 2011-11-11 MED ORDER — LORAZEPAM 2 MG/ML IJ SOLN
1.0000 mg | INTRAMUSCULAR | Status: DC | PRN
  Administered 2011-11-11 – 2011-11-12 (×2): 1 mg via INTRAVENOUS
  Filled 2011-11-11 (×2): qty 1

## 2011-11-11 NOTE — Progress Notes (Signed)
Subjective: 1 Day Post-Op Procedure(s) (LRB): IRRIGATION AND DEBRIDEMENT EXTREMITY (Right) Patient reports pain as a little less over the last 24 hours.  Has not appeared septic.  Objective: Vital signs in last 24 hours: Temp:  [98 F (36.7 C)-99.4 F (37.4 C)] 98 F (36.7 C) (11/18 0352) Pulse Rate:  [85-114] 85  (11/18 0352) Resp:  [7-26] 21  (11/18 0352) BP: (97-160)/(47-93) 101/47 mmHg (11/18 0352) SpO2:  [89 %-100 %] 98 % (11/18 0352) Weight:  [70.9 kg (156 lb 4.9 oz)-77.111 kg (170 lb)] 156 lb 4.9 oz (70.9 kg) (11/17 1516)  Labs: WBC yesterday 15,000.  Did not check CBC yet today.  Sensation intact distally Intact pulses distally Incision: scant drainage Compartment soft No gross purulence. Not septic appearing  Assessment/Plan: 1 Day Post-Op Procedure(s) (LRB): IRRIGATION AND DEBRIDEMENT EXTREMITY (Right)  1) continue IV antibiotics 2) likely d/c on oral abx in the next 24-48 hours if continues to improve.  [name] 11/11/2011, 8:29 AM

## 2011-11-11 NOTE — Progress Notes (Signed)
Pt was transferred to room 5009. Pt belongings sent to new room. Report call to receiving nurse and all questions answered. Vital signs at time of transfer were stable.

## 2011-11-11 NOTE — Progress Notes (Signed)
Subjective: Patient seen and examined. Feeling better today, less pain in his right upper extremity.  Objective: Vital signs in last 24 hours: Temp:  [98 F (36.7 C)-99.4 F (37.4 C)] 98.4 F (36.9 C) (11/18 0842) Pulse Rate:  [85-103] 91  (11/18 0842) Resp:  [7-26] 20  (11/18 0842) BP: (97-133)/(47-71) 105/50 mmHg (11/18 0842) SpO2:  [89 %-100 %] 97 % (11/18 0842) Weight:  [70.9 kg (156 lb 4.9 oz)] 156 lb 4.9 oz (70.9 kg) (11/17 1516) Weight change:  Last BM Date: 11/09/11  Intake/Output from previous day: 11/17 0701 - 11/18 0700 In: 3710 [P.O.:360; I.V.:3050; IV Piggyback:300] Out: 900 [Urine:900] Total I/O In: 449.4 [P.O.:360; I.V.:89.4] Out: -    Physical Exam: General: Alert, awake, oriented x3, in no acute distress. Heart: Regular rate and rhythm, without murmurs, rubs, gallops. Lungs: Clear to auscultation bilaterally. Abdomen: Soft, nontender, nondistended, positive bowel sounds. Extremities: No clubbing cyanosis or pedal edema with positive pedal pulses. Right upper extremity his own dressing and elevated .     Lab Results: Results for orders placed during the hospital encounter of 11/10/11 (from the past 24 hour(s))  MRSA PCR SCREENING     Status: Normal   Collection Time   11/10/11  4:44 PM      Component Value Range   MRSA by PCR NEGATIVE  NEGATIVE     Studies/Results: No results found.  Medications:    . HYDROmorphone      . HYDROmorphone PCA 0.3 mg/mL   Intravenous Q4H  . ketorolac  15 mg Intravenous Q6H  . midazolam      . nicotine  21 mg Transdermal Daily  . piperacillin-tazobactam (ZOSYN)  IV  3.375 g Intravenous Q8H  . sodium chloride      . vancomycin  1,250 mg Intravenous Once  . vancomycin  1,250 mg Intravenous Q12H  . DISCONTD: morphine   Intravenous Q4H    diphenhydrAMINE, LORazepam, methocarbamol(ROBAXIN) IV, methocarbamol, metoCLOPramide (REGLAN) injection, metoCLOPramide, midazolam, morphine, naloxone, naloxone, ondansetron  (ZOFRAN) IV, ondansetron (ZOFRAN) IV, senna-docusate, sodium chloride, sodium chloride, zolpidem, DISCONTD: HYDROcodone-acetaminophen, DISCONTD: HYDROmorphone, DISCONTD: meperidine, DISCONTD: morphine, DISCONTD: oxyCODONE DISCONTD: polymyxin / bacitracin (DOUBLE ANTIBIOTIC) irrigation, DISCONTD: promethazine, DISCONTD: sodium chloride irrigation     . sodium chloride 75 mL/hr at 11/11/11 0800    Assessment/Plan: Right upper extremity cellulitis Mrs. necrotizing fasciitis.  Status post I&D  Continue with vancomycin and Zosyn,  Cultures negative so far.continuePain control and wound care.  History of recurrent C. difficile colitis colitis  Last treated on September 2012.  Continue florastor 1 tab bid while on broad-spectrum antibiotics.  Polysubstance abuse.  Social worker consult ordered/counseling  History of hepatitis C  To be addressed as an outpatient.     LOS: 1 day   [name] 11/11/2011, 12:09 PM

## 2011-11-11 NOTE — Progress Notes (Signed)
Pt had 20 beats of labeled V-Tach, MD/N, strip placed in chart

## 2011-11-12 LAB — CBC
HCT: 34.3 % — ABNORMAL LOW (ref 39.0–52.0)
MCV: 86.2 fL (ref 78.0–100.0)
RBC: 3.98 MIL/uL — ABNORMAL LOW (ref 4.22–5.81)
WBC: 8.9 10*3/uL (ref 4.0–10.5)

## 2011-11-12 MED ORDER — DOXYCYCLINE HYCLATE 100 MG PO TABS: 100.0000 mg | ORAL_TABLET | Freq: Two times a day (BID) | ORAL | Status: AC

## 2011-11-12 MED ORDER — OXYCODONE-ACETAMINOPHEN 5-325 MG PO TABS: 1.0000 | ORAL_TABLET | Freq: Four times a day (QID) | ORAL | Status: DC | PRN

## 2011-11-12 MED ORDER — OXYCODONE HCL 5 MG PO TABS: 5.0000 mg | ORAL_TABLET | ORAL | Status: AC | PRN

## 2011-11-12 MED ORDER — LORAZEPAM 2 MG/ML IJ SOLN
INTRAMUSCULAR | Status: AC
  Filled 2011-11-12: qty 1

## 2011-11-12 MED ORDER — OXYCODONE HCL 5 MG PO CAPS: ORAL_CAPSULE | ORAL | Status: DC

## 2011-11-12 MED ORDER — OXYCODONE HCL 5 MG PO TABS
5.0000 mg | ORAL_TABLET | ORAL | Status: DC | PRN
  Administered 2011-11-12 (×2): 10 mg via ORAL
  Filled 2011-11-12 (×2): qty 2

## 2011-11-12 NOTE — Progress Notes (Signed)
Subjective: 2 Days Post-Op Procedure(s) (LRB): IRRIGATION AND DEBRIDEMENT EXTREMITY (Right) Patient reports pain as moderate.  No acute changes.  Objective: Vital signs in last 24 hours: Temp:  [98.4 F (36.9 C)-98.7 F (37.1 C)] 98.4 F (36.9 C) (11/19 0500) Pulse Rate:  [79-91] 79  (11/19 0500) Resp:  [18-20] 18  (11/19 0500) BP: (103-121)/(50-81) 110/68 mmHg (11/19 0500) SpO2:  [95 %-99 %] 97 % (11/19 0500)  Intake/Output from previous day: 11/18 0701 - 11/19 0700 In: 689.4 [P.O.:600; I.V.:89.4] Out: 700 [Urine:700] Intake/Output this shift:     Basename 11/12/11 0550 11/11/11 2007 11/10/11 1035  HGB 11.9* 11.4* 15.7    Basename 11/12/11 0550 11/11/11 2007  WBC 8.9 9.5  RBC 3.98* 3.84*  HCT 34.3* 32.6*  PLT 235 240    Basename 11/10/11 1035  NA 135  K 3.6  CL 97  CO2 24  BUN 14  CREATININE 0.69  GLUCOSE 109*  CALCIUM 9.3   No results found for this basename: LABPT:2,INR:2 in the last 72 hours  Neurovascular intact Incision: scant drainage No cellulitis present Compartment soft  Assessment/Plan: 2 Days Post-Op Procedure(s) (LRB): IRRIGATION AND DEBRIDEMENT EXTREMITY (Right) Discharge to home today on oral abx. F/u 2 days in office.  Schyler Butikofer Y 11/12/2011, 7:04 AM

## 2011-11-12 NOTE — Discharge Summary (Signed)
Physician Discharge Summary  Patient ID: [name] MRN: [id number] DOB/AGE: 1981-08-28 30 y.o.  Admit date: 11/10/2011 Discharge date: 11/12/2011  Admission Diagnoses:  Active Problems:  Abscess of forearm, right   Discharge Diagnoses:  Same  Past Medical History  Diagnosis Date  . Hepatitis C   . Diverticulitis   . Colitis   . Drug abuse   . Anxiety   . Active smoker   . Depression     Surgeries: Procedure(s): IRRIGATION AND DEBRIDEMENT EXTREMITY on 11/10/2011   Consultants (if any): Treatment Team:  [facility]  Discharged Condition: Improved  Hospital Course: [name] is an 30 y.o. male who was admitted 11/10/2011 with a diagnosis of right arm abscess and went to the operating room on 11/10/2011 and underwent the above named procedures.    He was given perioperative antibiotics:  Anti-infectives     Start     Dose/Rate Route Frequency Ordered Stop   11/12/11 0000   doxycycline (VIBRA-TABS) 100 MG tablet        100 mg Oral 2 times daily 11/12/11 0707 11/22/11 2359   11/10/11 2300   vancomycin (VANCOCIN) 1,250 mg in sodium chloride 0.9 % 250 mL IVPB        1,250 mg 166.7 mL/hr over 90 Minutes Intravenous Every 12 hours 11/10/11 2005     11/10/11 2200  piperacillin-tazobactam (ZOSYN) IVPB 3.375 g       3.375 g 12.5 mL/hr over 240 Minutes Intravenous 3 times per day 11/10/11 2005     11/10/11 1238   polymyxin B 500,000 Units, bacitracin 50,000 Units in sodium chloride irrigation 0.9 % 500 mL irrigation  Status:  Discontinued          As needed 11/10/11 1239 11/10/11 1323   11/10/11 1030   vancomycin (VANCOCIN) 1,250 mg in sodium chloride 0.9 % 250 mL IVPB        1,250 mg 166.7 mL/hr over 90 Minutes Intravenous  Once 11/10/11 1025 11/10/11 1215   11/10/11 1030  piperacillin-tazobactam (ZOSYN) IVPB 4.5 g       4.5 g 200 mL/hr over 30 Minutes Intravenous  Once 11/10/11 1025 11/10/11 1157        .  He was given sequential compression devices,  early ambulation, and chemoprophylaxis for DVT prophylaxis.  They benefited maximally from their hospital stay and there were no complications.    Recent vital signs:  Filed Vitals:   11/12/11 0500  BP: 110/68  Pulse: 79  Temp: 98.4 F (36.9 C)  Resp: 18    Recent laboratory studies:  Lab Results  Component Value Date   HGB 11.9* 11/12/2011   HGB 11.4* 11/11/2011   HGB 15.7 11/10/2011   Lab Results  Component Value Date   WBC 8.9 11/12/2011   PLT 235 11/12/2011   Lab Results  Component Value Date   INR 1.03 08/01/2011   Lab Results  Component Value Date   NA 135 11/10/2011   K 3.6 11/10/2011   CL 97 11/10/2011   CO2 24 11/10/2011   BUN 14 11/10/2011   CREATININE 0.69 11/10/2011   GLUCOSE 109* 11/10/2011    Discharge Medications:  Percocet, doxycycline Current Discharge Medication List    START taking these medications   Details  doxycycline (VIBRA-TABS) 100 MG tablet Take 1 tablet (100 mg total) by mouth 2 (two) times daily. Qty: 20 tablet, Refills: 0    oxyCODONE-acetaminophen (ROXICET) 5-325 MG per tablet Take 1-2 tablets by mouth every 6 (six) hours as  needed for pain. Qty: 60 tablet, Refills: 0        Diagnostic Studies: No results found.  Disposition: Home or Self Care       Signed: Kathryne Hitch 11/12/2011, 7:10 AM

## 2011-11-13 ENCOUNTER — Encounter (HOSPITAL_COMMUNITY): Payer: Self-pay | Admitting: Orthopaedic Surgery

## 2011-11-13 LAB — CULTURE, ROUTINE-ABSCESS

## 2011-11-13 NOTE — Progress Notes (Signed)
Clinical Social Work-Please see full assessment in shadow chart from 11/12/11. CSW completed full assessment and provided pt with substance abuse resources including NA meeting schedule/CDIOP/[place] [facility]. No further needs. Signed off-Asha Grumbine-MSW, [phone]

## 2011-11-14 LAB — ANAEROBIC CULTURE

## 2011-11-16 LAB — CULTURE, BLOOD (ROUTINE X 2): Culture  Setup Time: 201211171715

## 2011-11-26 ENCOUNTER — Encounter (HOSPITAL_COMMUNITY): Payer: Self-pay | Admitting: Emergency Medicine

## 2011-11-26 ENCOUNTER — Emergency Department (HOSPITAL_COMMUNITY)
Admission: EM | Admit: 2011-11-26 | Discharge: 2011-11-27 | Disposition: A | Discharge status: Home / Self Care | Payer: Self-pay | Attending: Emergency Medicine | Admitting: Emergency Medicine

## 2011-11-26 DIAGNOSIS — Z8619 Personal history of other infectious and parasitic diseases: Secondary | ICD-10-CM | POA: Insufficient documentation

## 2011-11-26 DIAGNOSIS — F341 Dysthymic disorder: Secondary | ICD-10-CM | POA: Insufficient documentation

## 2011-11-26 DIAGNOSIS — R1110 Vomiting, unspecified: Secondary | ICD-10-CM

## 2011-11-26 DIAGNOSIS — R112 Nausea with vomiting, unspecified: Secondary | ICD-10-CM | POA: Insufficient documentation

## 2011-11-26 DIAGNOSIS — R197 Diarrhea, unspecified: Secondary | ICD-10-CM | POA: Insufficient documentation

## 2011-11-26 DIAGNOSIS — F1910 Other psychoactive substance abuse, uncomplicated: Secondary | ICD-10-CM | POA: Insufficient documentation

## 2011-11-26 DIAGNOSIS — F17200 Nicotine dependence, unspecified, uncomplicated: Secondary | ICD-10-CM | POA: Insufficient documentation

## 2011-11-26 DIAGNOSIS — R1032 Left lower quadrant pain: Secondary | ICD-10-CM | POA: Insufficient documentation

## 2011-11-26 HISTORY — DX: Carrier of other intestinal infectious diseases: Z22.1

## 2011-11-26 HISTORY — DX: Carrier or suspected carrier of methicillin resistant Staphylococcus aureus: Z22.322

## 2011-11-26 LAB — URINALYSIS, ROUTINE W REFLEX MICROSCOPIC
Ketones, ur: NEGATIVE mg/dL
Leukocytes, UA: NEGATIVE
Nitrite: NEGATIVE
pH: 5.5 (ref 5.0–8.0)

## 2011-11-26 NOTE — ED Notes (Signed)
PT. REPORTS LLQ PAIN X3 DAYS WITH VOMITING AND DIARRHEA , LOW GRADE FEVER TODAY .

## 2011-11-27 LAB — DIFFERENTIAL
Basophils Absolute: 0.1 10*3/uL (ref 0.0–0.1)
Basophils Relative: 1 % (ref 0–1)
Lymphocytes Relative: 18 % (ref 12–46)
Neutro Abs: 7.2 10*3/uL (ref 1.7–7.7)
Neutrophils Relative %: 70 % (ref 43–77)

## 2011-11-27 LAB — RAPID URINE DRUG SCREEN, HOSP PERFORMED
Amphetamines: POSITIVE — AB
Barbiturates: NOT DETECTED
Benzodiazepines: POSITIVE — AB
Cocaine: NOT DETECTED
Opiates: POSITIVE — AB
Tetrahydrocannabinol: POSITIVE — AB

## 2011-11-27 LAB — BASIC METABOLIC PANEL
Chloride: 99 mEq/L (ref 96–112)
Creatinine, Ser: 0.67 mg/dL (ref 0.50–1.35)
GFR calc Af Amer: >90 mL/min (ref >90)
Potassium: 3.5 mEq/L (ref 3.5–5.1)
Sodium: 136 mEq/L (ref 135–145)

## 2011-11-27 LAB — CBC
MCHC: 34 g/dL (ref 30.0–36.0)
Platelets: 317 10*3/uL (ref 150–400)
RDW: 12.8 % (ref 11.5–15.5)
WBC: 10.3 10*3/uL (ref 4.0–10.5)

## 2011-11-27 MED ORDER — SODIUM CHLORIDE 0.9 % IV BOLUS (SEPSIS)
1000.0000 mL | Freq: Once | INTRAVENOUS | Status: AC
  Administered 2011-11-27: 1000 mL via INTRAVENOUS

## 2011-11-27 MED ORDER — ONDANSETRON 8 MG PO TBDP: 8.0000 mg | ORAL_TABLET | Freq: Three times a day (TID) | ORAL | Status: AC | PRN

## 2011-11-27 MED ORDER — MORPHINE SULFATE 4 MG/ML IJ SOLN
4.0000 mg | Freq: Once | INTRAMUSCULAR | Status: AC
  Administered 2011-11-27: 4 mg via INTRAVENOUS
  Filled 2011-11-27: qty 1

## 2011-11-27 MED ORDER — ONDANSETRON HCL 4 MG/2ML IJ SOLN
4.0000 mg | Freq: Once | INTRAMUSCULAR | Status: AC
  Administered 2011-11-27: 4 mg via INTRAVENOUS
  Filled 2011-11-27: qty 2

## 2011-11-27 NOTE — ED Notes (Signed)
Pt now states increased pain. Provider made aware.

## 2011-11-27 NOTE — ED Provider Notes (Signed)
History     CSN: [id number] Arrival date & time: 11/26/2011  8:13 PM   First MD Initiated Contact with Patient 11/26/11 2344      Chief Complaint  Patient presents with  . Abdominal Pain  . Nausea  . Emesis  . Diarrhea     Patient is a 30 y.o. male presenting with abdominal pain, vomiting, and diarrhea. The history is provided by the patient.  Abdominal Pain The primary symptoms of the illness include abdominal pain, vomiting and diarrhea. The current episode started 13 to 24 hours ago. The onset of the illness was sudden.  Emesis  Associated symptoms include abdominal pain and diarrhea.  Diarrhea The primary symptoms include abdominal pain, vomiting and diarrhea.   patient reports three-day history of increasing left lower quadrant abdominal pain diarrhea nausea and vomiting. Reports approximately 9 episodes of diarrhea and 5 at 5 or 6 episodes of vomiting in the last 24 hours recent history of colitis/diverticulitis. Recent hospitalization for same. States was placed on a round of Cipro and Flagyl then vancomycin and symptoms seemed to improve. Patient with recent surgery approximately 2 weeks ago to right arm cellulitis that developed status post IV drug use.   Past Medical History  Diagnosis Date  . Hepatitis C   . Diverticulitis   . Colitis   . Drug abuse   . Anxiety   . Active smoker   . Depression   . MRSA (methicillin resistant staph aureus) culture positive   . 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 I&d extremity 11/10/2011    Procedure: IRRIGATION AND DEBRIDEMENT EXTREMITY;  Surgeon: Kathryne Hitch;  Location: [facility] OR;  Service: Orthopedics;  Laterality: Right;  . I&d extremity 11/10/2011    Procedure: IRRIGATION AND DEBRIDEMENT EXTREMITY;  Surgeon: Kathryne Hitch;  Location: [facility] OR;  Service: Orthopedics;  Laterality: Right;  with volar compartment release    No family history on file.  History  Substance Use  Topics  . Smoking status: Current Everyday Smoker -- 1.0 packs/day for 18 years    Types: Cigarettes  . Smokeless tobacco: Never Used  . Alcohol Use:       Review of Systems  Constitutional: Negative.   HENT: Negative.   Eyes: Negative.   Respiratory: Negative.   Cardiovascular: Negative.   Gastrointestinal: Positive for vomiting, abdominal pain and diarrhea.  Genitourinary: Negative.   Musculoskeletal: Negative.   Skin: Negative.   Neurological: Negative.   Hematological: Negative.   Psychiatric/Behavioral: Negative.     Allergies  Review of patient's allergies indicates no known allergies.  Home Medications   Current Outpatient Rx  Name Route Sig Dispense Refill  . OXYCODONE HCL 5 MG PO CAPS Oral Take 5-10 mg by mouth every 4 (four) hours as needed. For pain.       BP 122/85  Pulse 90  Temp(Src) 99.1 F (37.3 C) (Oral)  Resp 20  SpO2 100%  Physical Exam  Constitutional: He is oriented to person, place, and time. He appears well-developed and well-nourished.  HENT:  Head: Normocephalic and atraumatic.  Eyes: Pupils are equal, round, and reactive to light.  Neck: Neck supple.  Cardiovascular: Normal rate and regular rhythm.   Pulmonary/Chest: Effort normal and breath sounds normal.  Abdominal: Soft. Bowel sounds are normal.    Neurological: He is alert and oriented to person, place, and time.  Skin: Skin is warm and dry.  Psychiatric: He has a normal mood and  affect.    ED Course  Procedures Patient with reports of abdominal pain x3 days it's been associated with nausea vomiting and diarrhea. Has been seen multiple times for similar symptoms. Four CT abdomen pelvis with contrast since July. Findings on the CT have ranged from negative to colitis to possible diverticulitis been no clear diagnosis was ever made. Subjective tenderness to palpation to the left lower abdomen. Remaining abdominal exam benign. Will obtain baseline labs and urine drug screen,  initiate IV hydration medicate for pain and reevaluate.  2:12 AM CBC, the neck, UA completely normal. Urine drug screen positive and positive for all except for cocaine. Discussed patient with Dr. Judd Lien. Will plan to discharge patient home and encourage him to get established with a primary care physician. I have discussed findings and clinical impression with patient. I also recommended that he abstain from drug use particularly when having abdominal pain nausea and vomiting and diarrhea. Will provide primary care referrals. Patient agreeable with plan. Labs Reviewed  URINE RAPID DRUG SCREEN (HOSP PERFORMED) - Abnormal; Notable for the following:    Opiates POSITIVE (*)    Benzodiazepines POSITIVE (*)    Amphetamines POSITIVE (*)    Tetrahydrocannabinol POSITIVE (*)    All other components within normal limits  CBC  DIFFERENTIAL  BASIC METABOLIC PANEL  URINALYSIS, ROUTINE W REFLEX MICROSCOPIC   No results found.   1. Abdominal pain   2. Polysubstance abuse       MDM  Findings suggest no acute abdominal process. UDS grossly positive. Suspect malingering and drug-seeking behavior.         Leanne Chang, NP 11/27/11 [phone]

## 2011-11-27 NOTE — ED Notes (Signed)
States llq abd pain with n/v/d x 3 days. Pt states hx of diverticulitis.

## 2011-11-27 NOTE — ED Provider Notes (Signed)
Medical screening examination/treatment/procedure(s) were performed by non-physician practitioner and as supervising physician I was immediately available for consultation/collaboration.   Geoffery Lyons, MD 11/27/11 1357

## 2011-12-24 ENCOUNTER — Encounter (HOSPITAL_COMMUNITY): Payer: Self-pay | Admitting: Anesthesiology

## 2011-12-24 ENCOUNTER — Emergency Department (HOSPITAL_COMMUNITY): Payer: Self-pay | Admitting: Anesthesiology

## 2011-12-24 ENCOUNTER — Encounter (HOSPITAL_COMMUNITY): Payer: Self-pay | Admitting: *Deleted

## 2011-12-24 ENCOUNTER — Emergency Department (HOSPITAL_COMMUNITY): Payer: Self-pay

## 2011-12-24 ENCOUNTER — Encounter (HOSPITAL_COMMUNITY)
Admission: EM | Disposition: A | Discharge status: Home / Self Care | Payer: Self-pay | Source: Home / Self Care | Attending: Orthopaedic Surgery

## 2011-12-24 ENCOUNTER — Inpatient Hospital Stay (HOSPITAL_COMMUNITY)
Admission: EM | Admit: 2011-12-24 | Discharge: 2011-12-28 | DRG: 581 | Disposition: A | Discharge status: Home / Self Care | Payer: Self-pay | Attending: Orthopaedic Surgery | Admitting: Orthopaedic Surgery

## 2011-12-24 DIAGNOSIS — B1920 Unspecified viral hepatitis C without hepatic coma: Secondary | ICD-10-CM | POA: Diagnosis present

## 2011-12-24 DIAGNOSIS — F329 Major depressive disorder, single episode, unspecified: Secondary | ICD-10-CM | POA: Diagnosis present

## 2011-12-24 DIAGNOSIS — F1210 Cannabis abuse, uncomplicated: Secondary | ICD-10-CM | POA: Diagnosis present

## 2011-12-24 DIAGNOSIS — F1510 Other stimulant abuse, uncomplicated: Secondary | ICD-10-CM | POA: Diagnosis present

## 2011-12-24 DIAGNOSIS — F17200 Nicotine dependence, unspecified, uncomplicated: Secondary | ICD-10-CM | POA: Diagnosis present

## 2011-12-24 DIAGNOSIS — IMO0002 Reserved for concepts with insufficient information to code with codable children: Principal | ICD-10-CM | POA: Diagnosis present

## 2011-12-24 DIAGNOSIS — L02413 Cutaneous abscess of right upper limb: Secondary | ICD-10-CM | POA: Diagnosis present

## 2011-12-24 DIAGNOSIS — K5730 Diverticulosis of large intestine without perforation or abscess without bleeding: Secondary | ICD-10-CM | POA: Diagnosis present

## 2011-12-24 DIAGNOSIS — F411 Generalized anxiety disorder: Secondary | ICD-10-CM | POA: Diagnosis present

## 2011-12-24 DIAGNOSIS — Z221 Carrier of other intestinal infectious diseases: Secondary | ICD-10-CM

## 2011-12-24 DIAGNOSIS — F3289 Other specified depressive episodes: Secondary | ICD-10-CM | POA: Diagnosis present

## 2011-12-24 HISTORY — PX: I & D EXTREMITY: SHX5045

## 2011-12-24 LAB — DIFFERENTIAL
Eosinophils Relative: 3 % (ref 0–5)
Lymphocytes Relative: 20 % (ref 12–46)
Lymphs Abs: 2.1 10*3/uL (ref 0.7–4.0)
Monocytes Absolute: 0.8 10*3/uL (ref 0.1–1.0)

## 2011-12-24 LAB — CBC
HCT: 39.8 % (ref 39.0–52.0)
Hemoglobin: 13.5 g/dL (ref 13.0–17.0)
MCH: 29.3 pg (ref 26.0–34.0)
MCHC: 33.9 g/dL (ref 30.0–36.0)
MCV: 86.5 fL (ref 78.0–100.0)
Platelets: 264 10*3/uL (ref 150–400)
RBC: 4.6 MIL/uL (ref 4.22–5.81)
RDW: 13.1 % (ref 11.5–15.5)
WBC: 10.5 10*3/uL (ref 4.0–10.5)

## 2011-12-24 LAB — BASIC METABOLIC PANEL
CO2: 26 mEq/L (ref 19–32)
Calcium: 9.6 mg/dL (ref 8.4–10.5)
Creatinine, Ser: 0.66 mg/dL (ref 0.50–1.35)
Glucose, Bld: 92 mg/dL (ref 70–99)

## 2011-12-24 SURGERY — IRRIGATION AND DEBRIDEMENT EXTREMITY
Anesthesia: General | Site: Arm Lower | Laterality: Right | Wound class: Dirty or Infected

## 2011-12-24 MED ORDER — CLINDAMYCIN PHOSPHATE 600 MG/50ML IV SOLN
600.0000 mg | Freq: Once | INTRAVENOUS | Status: AC
  Administered 2011-12-24: 600 mg via INTRAVENOUS
  Filled 2011-12-24: qty 50

## 2011-12-24 MED ORDER — HYDROMORPHONE HCL PF 1 MG/ML IJ SOLN
1.0000 mg | Freq: Once | INTRAMUSCULAR | Status: AC
  Administered 2011-12-24: 1 mg via INTRAVENOUS
  Filled 2011-12-24: qty 1

## 2011-12-24 MED ORDER — BUPIVACAINE HCL 0.5 % IJ SOLN
INTRAMUSCULAR | Status: DC | PRN
  Administered 2011-12-24: 3 mL

## 2011-12-24 MED ORDER — FENTANYL CITRATE 0.05 MG/ML IJ SOLN
INTRAMUSCULAR | Status: DC | PRN
  Administered 2011-12-24: 100 ug via INTRAVENOUS
  Administered 2011-12-24: 50 ug via INTRAVENOUS
  Administered 2011-12-24: 100 ug via INTRAVENOUS

## 2011-12-24 MED ORDER — DROPERIDOL 2.5 MG/ML IJ SOLN
INTRAMUSCULAR | Status: DC | PRN
  Administered 2011-12-24: .6 mg via INTRAVENOUS

## 2011-12-24 MED ORDER — PROPOFOL 10 MG/ML IV BOLUS
INTRAVENOUS | Status: DC | PRN
  Administered 2011-12-24: 200 mg via INTRAVENOUS

## 2011-12-24 MED ORDER — CEFAZOLIN SODIUM-DEXTROSE 2-3 GM-% IV SOLR
2.0000 g | Freq: Once | INTRAVENOUS | Status: DC
  Filled 2011-12-24: qty 50

## 2011-12-24 MED ORDER — CLINDAMYCIN PHOSPHATE 600 MG/4ML IJ SOLN
INTRAMUSCULAR | Status: AC
  Filled 2011-12-24: qty 4

## 2011-12-24 MED ORDER — HYDROMORPHONE 0.3 MG/ML IV SOLN
INTRAVENOUS | Status: DC
  Administered 2011-12-25: 01:00:00 via INTRAVENOUS
  Administered 2011-12-25: 4.8 mg via INTRAVENOUS
  Administered 2011-12-25: 18:00:00 via INTRAVENOUS
  Administered 2011-12-25: 4.4 mg via INTRAVENOUS
  Administered 2011-12-26: 21:00:00 via INTRAVENOUS
  Administered 2011-12-26: 4.5 mg via INTRAVENOUS
  Administered 2011-12-26: 01:00:00 via INTRAVENOUS
  Administered 2011-12-27: 3.9 mg via INTRAVENOUS
  Administered 2011-12-27: 3 mg via INTRAVENOUS
  Administered 2011-12-27: 06:00:00 via INTRAVENOUS
  Filled 2011-12-24 (×8): qty 25

## 2011-12-24 MED ORDER — NALOXONE HCL 0.4 MG/ML IJ SOLN: 0.4000 mg | INTRAMUSCULAR | Status: DC | PRN

## 2011-12-24 MED ORDER — DEXTROSE 5 % IV SOLN
INTRAVENOUS | Status: AC
  Filled 2011-12-24: qty 50

## 2011-12-24 MED ORDER — SODIUM CHLORIDE 0.9 % IV SOLN
INTRAVENOUS | Status: DC
  Administered 2011-12-24: 999 mL via INTRAVENOUS

## 2011-12-24 MED ORDER — SODIUM CHLORIDE 0.9 % IJ SOLN: 9.0000 mL | INTRAMUSCULAR | Status: DC | PRN

## 2011-12-24 MED ORDER — FENTANYL CITRATE 0.05 MG/ML IJ SOLN
INTRAMUSCULAR | Status: AC
  Filled 2011-12-24: qty 2

## 2011-12-24 MED ORDER — ONDANSETRON HCL 4 MG/2ML IJ SOLN: 4.0000 mg | Freq: Four times a day (QID) | INTRAMUSCULAR | Status: DC | PRN

## 2011-12-24 MED ORDER — METOCLOPRAMIDE HCL 5 MG/ML IJ SOLN
INTRAMUSCULAR | Status: DC | PRN
  Administered 2011-12-24: 10 mg via INTRAVENOUS

## 2011-12-24 MED ORDER — LACTATED RINGERS IV SOLN
INTRAVENOUS | Status: DC | PRN
  Administered 2011-12-24 (×2): via INTRAVENOUS

## 2011-12-24 MED ORDER — ONDANSETRON HCL 4 MG/2ML IJ SOLN
INTRAMUSCULAR | Status: DC | PRN
  Administered 2011-12-24: 4 mg via INTRAVENOUS

## 2011-12-24 MED ORDER — METOCLOPRAMIDE HCL 5 MG/ML IJ SOLN
10.0000 mg | Freq: Once | INTRAMUSCULAR | Status: AC | PRN
  Filled 2011-12-24: qty 2

## 2011-12-24 MED ORDER — FENTANYL CITRATE 0.05 MG/ML IJ SOLN
100.0000 ug | Freq: Once | INTRAMUSCULAR | Status: AC
  Administered 2011-12-24: 100 ug via INTRAVENOUS
  Filled 2011-12-24: qty 2

## 2011-12-24 MED ORDER — DIPHENHYDRAMINE HCL 12.5 MG/5ML PO ELIX
12.5000 mg | ORAL_SOLUTION | Freq: Four times a day (QID) | ORAL | Status: DC | PRN
  Filled 2011-12-24: qty 5

## 2011-12-24 MED ORDER — SODIUM CHLORIDE 0.9 % IR SOLN
Status: DC | PRN
  Administered 2011-12-24: 1

## 2011-12-24 MED ORDER — CEFAZOLIN SODIUM 1-5 GM-% IV SOLN
INTRAVENOUS | Status: DC | PRN
  Administered 2011-12-24: 2 g via INTRAVENOUS

## 2011-12-24 MED ORDER — SUCCINYLCHOLINE CHLORIDE 20 MG/ML IJ SOLN
INTRAMUSCULAR | Status: DC | PRN
  Administered 2011-12-24: 100 mg via INTRAVENOUS

## 2011-12-24 MED ORDER — FENTANYL CITRATE 0.05 MG/ML IJ SOLN
25.0000 ug | INTRAMUSCULAR | Status: DC | PRN
  Administered 2011-12-24 (×2): 25 ug via INTRAVENOUS
  Administered 2011-12-24 – 2011-12-25 (×2): 50 ug via INTRAVENOUS

## 2011-12-24 MED ORDER — KCL IN DEXTROSE-NACL 20-5-0.45 MEQ/L-%-% IV SOLN
INTRAVENOUS | Status: DC
  Administered 2011-12-25: 04:00:00 via INTRAVENOUS
  Filled 2011-12-24 (×6): qty 1000

## 2011-12-24 MED ORDER — GADOBENATE DIMEGLUMINE 529 MG/ML IV SOLN
14.0000 mL | Freq: Once | INTRAVENOUS | Status: AC | PRN
  Administered 2011-12-24: 14 mL via INTRAVENOUS

## 2011-12-24 MED ORDER — DIPHENHYDRAMINE HCL 50 MG/ML IJ SOLN: 12.5000 mg | Freq: Four times a day (QID) | INTRAMUSCULAR | Status: DC | PRN

## 2011-12-24 SURGICAL SUPPLY — 39 items
BAG DECANTER FOR FLEXI CONT (MISCELLANEOUS) IMPLANT
BANDAGE ELASTIC 4 VELCRO ST LF (GAUZE/BANDAGES/DRESSINGS) ×2 IMPLANT
BANDAGE GAUZE ELAST BULKY 4 IN (GAUZE/BANDAGES/DRESSINGS) ×2 IMPLANT
BNDG COHESIVE 4X5 TAN STRL (GAUZE/BANDAGES/DRESSINGS) IMPLANT
CLOTH BEACON ORANGE TIMEOUT ST (SAFETY) ×2 IMPLANT
COVER SURGICAL LIGHT HANDLE (MISCELLANEOUS) ×2 IMPLANT
CUFF TOURNIQUET SINGLE 18IN (TOURNIQUET CUFF) ×2 IMPLANT
DRSG EMULSION OIL 3X3 NADH (GAUZE/BANDAGES/DRESSINGS) ×2 IMPLANT
DRSG PAD ABDOMINAL 8X10 ST (GAUZE/BANDAGES/DRESSINGS) IMPLANT
ELECT REM PT RETURN 9FT ADLT (ELECTROSURGICAL) ×2
ELECTRODE REM PT RTRN 9FT ADLT (ELECTROSURGICAL) ×1 IMPLANT
GAUZE SPONGE 4X4 12PLY STRL LF (GAUZE/BANDAGES/DRESSINGS) ×2 IMPLANT
GAUZE XEROFORM 5X9 LF (GAUZE/BANDAGES/DRESSINGS) ×2 IMPLANT
GLOVE BIOGEL PI IND STRL 8 (GLOVE) ×1 IMPLANT
GLOVE BIOGEL PI INDICATOR 8 (GLOVE) ×1
GLOVE ORTHO TXT STRL SZ7.5 (GLOVE) ×2 IMPLANT
GOWN PREVENTION PLUS LG XLONG (DISPOSABLE) IMPLANT
GOWN PREVENTION PLUS XLARGE (GOWN DISPOSABLE) ×2 IMPLANT
GOWN STRL NON-REIN LRG LVL3 (GOWN DISPOSABLE) ×2 IMPLANT
HANDPIECE INTERPULSE COAX TIP (DISPOSABLE)
KIT BASIN OR (CUSTOM PROCEDURE TRAY) ×2 IMPLANT
KIT ROOM TURNOVER OR (KITS) ×2 IMPLANT
MANIFOLD NEPTUNE II (INSTRUMENTS) ×2 IMPLANT
NS IRRIG 1000ML POUR BTL (IV SOLUTION) ×2 IMPLANT
PACK ORTHO EXTREMITY (CUSTOM PROCEDURE TRAY) ×2 IMPLANT
PAD ARMBOARD 7.5X6 YLW CONV (MISCELLANEOUS) ×4 IMPLANT
SET HNDPC FAN SPRY TIP SCT (DISPOSABLE) IMPLANT
SPONGE GAUZE 4X4 12PLY (GAUZE/BANDAGES/DRESSINGS) ×2 IMPLANT
SPONGE LAP 18X18 X RAY DECT (DISPOSABLE) ×2 IMPLANT
SPONGE LAP 4X18 X RAY DECT (DISPOSABLE) IMPLANT
STOCKINETTE IMPERVIOUS 9X36 MD (GAUZE/BANDAGES/DRESSINGS) IMPLANT
SUT ETHILON 4 0 PS 2 18 (SUTURE) ×4 IMPLANT
TOWEL OR 17X24 6PK STRL BLUE (TOWEL DISPOSABLE) ×2 IMPLANT
TOWEL OR 17X26 10 PK STRL BLUE (TOWEL DISPOSABLE) ×2 IMPLANT
TUBE ANAEROBIC SPECIMEN COL (MISCELLANEOUS) ×4 IMPLANT
TUBE CONNECTING 12X1/4 (SUCTIONS) ×2 IMPLANT
UNDERPAD 30X30 INCONTINENT (UNDERPADS AND DIAPERS) ×2 IMPLANT
WATER STERILE IRR 1000ML POUR (IV SOLUTION) IMPLANT
YANKAUER SUCT BULB TIP NO VENT (SUCTIONS) ×2 IMPLANT

## 2011-12-24 NOTE — Anesthesia Procedure Notes (Addendum)
Procedure Name: Intubation Date/Time: 12/24/2011 10:42 PM Performed by: Alanda Amass Pre-anesthesia Checklist: Patient identified, Emergency Drugs available, Suction available, Patient being monitored and Timeout performed Patient Re-evaluated:Patient Re-evaluated prior to inductionOxygen Delivery Method: Circle System Utilized Preoxygenation: Pre-oxygenation with 100% oxygen Intubation Type: IV induction, Rapid sequence and Cricoid Pressure applied Laryngoscope Size: Mac and 3 Grade View: Grade I Tube type: Oral Tube size: 7.5 mm Number of attempts: 1 Placement Confirmation: ETT inserted through vocal cords under direct vision,  breath sounds checked- equal and bilateral and positive ETCO2 Secured at: 22 cm Tube secured with: Tape Dental Injury: Teeth and Oropharynx as per pre-operative assessment

## 2011-12-24 NOTE — H&P (Addendum)
[name] is an 30 y.o. male.   Chief Complaint: Recurrent right forearm absess HPI: Hx of abscess drainage in Nov.  By Dr. Magnus Grant with neg cultures.   Past Medical History  Diagnosis Date  . Hepatitis C   . Diverticulitis   . Colitis   . Drug abuse   . Anxiety   . Active smoker   . Depression   . MRSA (methicillin resistant staph aureus) culture positive   . 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 I&d extremity 11/10/2011    Procedure: IRRIGATION AND DEBRIDEMENT EXTREMITY;  Surgeon: [name];  Location: [facility] OR;  Service: Orthopedics;  Laterality: Right;  . I&d extremity 11/10/2011    Procedure: IRRIGATION AND DEBRIDEMENT EXTREMITY;  Surgeon: [name];  Location: [facility] OR;  Service: Orthopedics;  Laterality: Right;  with volar compartment release    History reviewed. No pertinent family history. Social History:  reports that he has been smoking Cigarettes.  He has a 18 pack-year smoking history. He has never used smokeless tobacco. He reports that he uses illicit drugs (Marijuana and Methamphetamines) about once per week. His alcohol history not on file.  Allergies: No Known Allergies  Medications Prior to Admission  Medication Dose Route Frequency Provider Last Rate Last Dose  . 0.9 %  sodium chloride infusion   Intravenous Continuous Kurt Sprout, MD 125 mL/hr at 12/24/11 1517 999 mL at 12/24/11 1517  . clindamycin (CLEOCIN) 600 MG/4ML injection           . clindamycin (CLEOCIN) IVPB 600 mg  600 mg Intravenous Once Kurt Sprout, MD   600 mg at 12/24/11 1530  . dextrose 5 % solution           . fentaNYL (SUBLIMAZE) injection 100 mcg  100 mcg Intravenous Once Kurt Norman, NP   100 mcg at 12/24/11 1831  . gadobenate dimeglumine (MULTIHANCE) injection 14 mL  14 mL Intravenous Once PRN Medication [name]   14 mL at 12/24/11 1915  . HYDROmorphone (DILAUDID) injection 1 mg  1 mg Intravenous Once Kurt Sprout, MD   1 mg at 12/24/11 1516  . HYDROmorphone (DILAUDID) injection 1 mg  1 mg Intravenous Once Kurt Norman, NP   1 mg at 12/24/11 1705  . HYDROmorphone (DILAUDID) injection 1 mg  1 mg Intravenous Once Kurt Norman, NP   1 mg at 12/24/11 2050   No current outpatient prescriptions on file as of 12/24/2011.    Results for orders placed during the hospital encounter of 12/24/11 (from the past 48 hour(s))  CBC     Status: Normal   Collection Time   12/24/11  2:58 PM      Component Value Range Comment   WBC 10.5  4.0 - 10.5 (K/uL)    RBC 4.60  4.22 - 5.81 (MIL/uL)    Hemoglobin 13.5  13.0 - 17.0 (g/dL)    HCT 82.9  56.2 - 13.0 (%)    MCV 86.5  78.0 - 100.0 (fL)    MCH 29.3  26.0 - 34.0 (pg)    MCHC 33.9  30.0 - 36.0 (g/dL)    RDW 86.5  78.4 - 69.6 (%)    Platelets 264  150 - 400 (K/uL)   DIFFERENTIAL     Status: Normal   Collection Time   12/24/11  2:58 PM      Component Value Range Comment   Neutrophils Relative 69  43 -  77 (%)    Neutro Abs 7.2  1.7 - 7.7 (K/uL)    Lymphocytes Relative 20  12 - 46 (%)    Lymphs Abs 2.1  0.7 - 4.0 (K/uL)    Monocytes Relative 8  3 - 12 (%)    Monocytes Absolute 0.8  0.1 - 1.0 (K/uL)    Eosinophils Relative 3  0 - 5 (%)    Eosinophils Absolute 0.3  0.0 - 0.7 (K/uL)    Basophils Relative 0  0 - 1 (%)    Basophils Absolute 0.0  0.0 - 0.1 (K/uL)   BASIC METABOLIC PANEL     Status: Normal   Collection Time   12/24/11  2:58 PM      Component Value Range Comment   Sodium 136  135 - 145 (mEq/L)    Potassium 4.3  3.5 - 5.1 (mEq/L)    Chloride 102  96 - 112 (mEq/L)    CO2 26  19 - 32 (mEq/L)    Glucose, Bld 92  70 - 99 (mg/dL)    BUN 9  6 - 23 (mg/dL)    Creatinine, Ser 1.61  0.50 - 1.35 (mg/dL)    Calcium 9.6  8.4 - 10.5 (mg/dL)    GFR calc non Af Amer >90  >90 (mL/min)    GFR calc Af Amer >90  >90 (mL/min)    Dg Forearm Right  12/24/2011  *RADIOLOGY REPORT*  Clinical Data: Soft tissue swelling of the forearm.  Status post  surgery.  RIGHT FOREARM - 2 VIEW  Comparison: None available.  Findings: There is some prominence of the soft tissues over the ventral aspect of the forearm.  No subcutaneous gas is evident. The underlying osseous structures are within normal limits.  IMPRESSION: Soft tissue swelling within the forearm without evidence for gas or underlying osseous abnormality.  Original Report Authenticated By: [name]. [name], M.D.   Mr Forearm Right Wo/w Cm  12/24/2011  *RADIOLOGY REPORT*  Clinical Data: Forearm abscess after injecting heroin.  MRI OF THE RIGHT FOREARM WITHOUT AND WITH CONTRAST  Technique:  Multiplanar, multisequence MR imaging was performed both before and after administration of intravenous contrast.  Contrast: 14mL MULTIHANCE GADOBENATE DIMEGLUMINE 529 MG/ML IV SOLN  Comparison: Radiographs dated 12/24/2011  Findings: The patient has a 7.3 x 3.3 x 2.0 cm abscess in the anterior lateral aspect of the right forearm. The abscess lies between the edematous brachioradialis muscle and the flexor carpi radialis muscle.  Both of those muscles are abnormal suggesting myositis.  There is overlying edema and abnormal soft tissue enhancement consistent with adjacent cellulitis. The abscess appears to be a deep to the fascia.  No abnormal osseous enhancement to suggest osteomyelitis.  There is no elbow joint effusion.  IMPRESSION:  Large abscess deep to the fascia of the proximal right forearm with adjacent myositis and cellulitis.  Original Report Authenticated By: [name], M.D.    Review of Systems  Constitutional: Positive for fever and weight loss.  Gastrointestinal:       Positive for Hepatitis C  Musculoskeletal: Positive for myalgias and joint pain.    Blood pressure 114/66, pulse 76, temperature 100.1 F (37.8 C), temperature source Oral, resp. rate 20, SpO2 99.00%. Physical Exam  Constitutional: He appears well-developed.  Eyes: Pupils are equal, round, and reactive to light.    Neck: Normal range of motion.  Cardiovascular: Normal rate.   Respiratory: Effort normal.  GI: Soft.  Musculoskeletal:  Tender right forearm with induration and tenderness     Assessment/Plan MRI positive recurrent abscess with fever and increased pain. Plan I and D of abscess. Got clindamycin in ER.   YATES,MARK C 12/24/2011, 9:55 PM

## 2011-12-24 NOTE — ED Provider Notes (Signed)
I saw pt primarily and care was continued in the CDU.  I reviewed and agree with the PA care.  Gwyneth Sprout, MD 12/24/11 [phone]

## 2011-12-24 NOTE — ED Notes (Signed)
Pt presents with pain to right arm. Had surgery on arm x 1 month ago and moderate swelling noted. Patient is able to move arm with pain noted.

## 2011-12-24 NOTE — ED Notes (Signed)
Pt reports surgery to right arm x 1 month ago for cellulitis. Started having pain again on Saturday, now having swelling to arm.

## 2011-12-24 NOTE — Anesthesia Preprocedure Evaluation (Addendum)
Anesthesia Evaluation  Patient identified by MRN, date of birth, ID band Patient awake    Reviewed: Allergy & Precautions, H&P , NPO status , Patient's Chart, lab work & pertinent test results, reviewed documented beta blocker date and time   Airway Mallampati: II TM Distance: >3 FB Neck ROM: full    Dental  (+) Dental Advisory Given,    Pulmonary neg pulmonary ROS,          Cardiovascular neg cardio ROS     Neuro/Psych PSYCHIATRIC DISORDERS Negative Neurological ROS     GI/Hepatic negative GI ROS, (+) Hepatitis -, C  Endo/Other  Negative Endocrine ROS  Renal/GU negative Renal ROS  Genitourinary negative   Musculoskeletal   Abdominal   Peds  Hematology negative hematology ROS (+)   Anesthesia Other Findings See surgeon's H&P   Reproductive/Obstetrics negative OB ROS                          Anesthesia Physical Anesthesia Plan  ASA: III and Emergent  Anesthesia Plan: General   Post-op Pain Management:    Induction: Intravenous  Airway Management Planned: Oral ETT  Additional Equipment:   Intra-op Plan:   Post-operative Plan: Extubation in OR  Informed Consent: I have reviewed the patients History and Physical, chart, labs and discussed the procedure including the risks, benefits and alternatives for the proposed anesthesia with the patient or authorized representative who has indicated his/her understanding and acceptance.     Plan Discussed with: CRNA and Surgeon  Anesthesia Plan Comments:         Anesthesia Quick Evaluation

## 2011-12-24 NOTE — Brief Op Note (Addendum)
12/24/2011  11:17 PM  PATIENT:  [name]  30 y.o. male  PRE-OPERATIVE DIAGNOSIS:  Right Proximal Forearm Abscess  POST-OPERATIVE DIAGNOSIS:  right proximal forearm abscess  PROCEDURE:  Procedure(s): IRRIGATION AND DEBRIDEMENT EXTREMITY right forearm abscess   7 X 4 X 3 cm  SURGEON:  Surgeon(s): Eldred Manges  PHYSICIAN ASSISTANT:   ASSISTANTS: none   ANESTHESIA:   local and general  EBL:  Total I/O In: 1200 [I.V.:1200] Out: -   BLOOD ADMINISTERED:none  DRAINS: one HV  medium  LOCAL MEDICATIONS USED:  MARCAINE 4CC  SPECIMEN:  No Specimen  DISPOSITION OF SPECIMEN:  N/A  COUNTS:  YES  TOURNIQUET:   Total Tourniquet Time Documented: Upper Arm (Right) - 6 minutes  DICTATION: .Other Dictation: Dictation Number [id number]  PLAN OF CARE: Admit to inpatient   PATIENT DISPOSITION:  PACU - hemodynamically stable.  AEROBIC AND ANAEROBIC CULTURES OBTAINED.  Delay start of Pharmacological VTE agent (>24hrs) due to surgical blood loss or risk of bleeding:  {YES/NO/NOT APPLICABLE:20182

## 2011-12-24 NOTE — ED Provider Notes (Signed)
History     CSN: [id number]  Arrival date & time 12/24/11  1207   First MD Initiated Contact with Patient 12/24/11 1444      Chief Complaint  Patient presents with  . Arm Pain    (Consider location/radiation/quality/duration/timing/severity/associated sxs/prior treatment) HPI Comments: Patient had a large forearm abscess about one month ago after injecting IV heroin. He underwent surgery where the area was cleaned out by Dr. Magnus Ivan. 2 days ago the area started becoming tender, red, warm and swollen and he developed fever yesterday.  Patient is a 30 y.o. male presenting with arm pain. The history is provided by the patient.  Arm Pain This is a recurrent problem. The current episode started 2 days ago. The problem occurs constantly. The problem has been gradually worsening. Associated symptoms comments: Fever, pain, redness, warmth. The symptoms are aggravated by bending. The symptoms are relieved by nothing. He has tried nothing for the symptoms. The treatment provided no relief.    Past Medical History  Diagnosis Date  . Hepatitis C   . Diverticulitis   . Colitis   . Drug abuse   . Anxiety   . Active smoker   . Depression   . MRSA (methicillin resistant staph aureus) culture positive   . 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 I&d extremity 11/10/2011    Procedure: IRRIGATION AND DEBRIDEMENT EXTREMITY;  Surgeon: Kathryne Hitch;  Location: [facility] OR;  Service: Orthopedics;  Laterality: Right;  . I&d extremity 11/10/2011    Procedure: IRRIGATION AND DEBRIDEMENT EXTREMITY;  Surgeon: Kathryne Hitch;  Location: [facility] OR;  Service: Orthopedics;  Laterality: Right;  with volar compartment release    History reviewed. No pertinent family history.  History  Substance Use Topics  . Smoking status: Current Everyday Smoker -- 1.0 packs/day for 18 years    Types: Cigarettes  . Smokeless tobacco: Never Used  . Alcohol Use:        Review of Systems  All other systems reviewed and are negative.    Allergies  Review of patient's allergies indicates no known allergies.  Home Medications   Current Outpatient Rx  Name Route Sig Dispense Refill  . IBUPROFEN 200 MG PO TABS Oral Take 200 mg by mouth daily.      Marland Kitchen METHADONE HCL 10 MG/ML PO CONC Oral Take 30 mg by mouth daily.        BP 133/89  Pulse 85  Temp(Src) 98.1 F (36.7 C) (Oral)  Resp 20  SpO2 100%  Physical Exam  Nursing note and vitals reviewed. Constitutional: He is oriented to person, place, and time. He appears well-developed and well-nourished. No distress.  HENT:  Head: Normocephalic and atraumatic.  Mouth/Throat: Oropharynx is clear and moist.  Eyes: Conjunctivae and EOM are normal. Pupils are equal, round, and reactive to light.  Neck: Normal range of motion. Neck supple.  Cardiovascular: Normal rate, regular rhythm and intact distal pulses.   No murmur heard. Pulmonary/Chest: Effort normal and breath sounds normal. No respiratory distress. He has no wheezes. He has no rales.  Musculoskeletal: Normal range of motion. He exhibits tenderness. He exhibits no edema.       Right forearm: He exhibits tenderness and swelling.       Arms: Neurological: He is alert and oriented to person, place, and time.  Skin: Skin is warm and dry. No rash noted. No erythema.  Psychiatric: He has a normal mood and affect. His behavior  is normal.    ED Course  Procedures (including critical care time)   Labs Reviewed  CBC  DIFFERENTIAL  BASIC METABOLIC PANEL   No results found.   No diagnosis found.    MDM   Patient with a history of IV heroin use who had surgery on his arm for a large abscess one month ago and 2 days ago it started swelling with pain and redness. On exam today he has a large area of cellulitis and fluctuance concerning for new abscess. Low-grade temp of 99.1 in pain with movement of the elbow and wrist but no joint  involvement. Good capillary refill and pulses. Tetanus shot up-to-date. Denies any further IV heroin use. Will start IV clindamycin and speak with Dr. Magnus Ivan in orthopedic surgery consult. CBC, BMP pending.        Gwyneth Sprout, MD 12/24/11 1534

## 2011-12-24 NOTE — ED Notes (Signed)
Patient transported to MRI 

## 2011-12-24 NOTE — Anesthesia Postprocedure Evaluation (Signed)
Anesthesia Post Note  Patient: [name]  Procedure(s) Performed:  IRRIGATION AND DEBRIDEMENT EXTREMITY  Anesthesia type: general  Patient location: PACU  Post pain: Pain level controlled  Post assessment: Patient's Cardiovascular Status Stable  Last Vitals:  Filed Vitals:   12/24/11 2345  BP: 106/61  Pulse: 81  Temp:   Resp: 16    Post vital signs: Reviewed and stable  Level of consciousness: sedated  Complications: No apparent anesthesia complications

## 2011-12-24 NOTE — Preoperative (Signed)
Beta Blockers   Reason not to administer Beta Blockers:Not Applicable 

## 2011-12-24 NOTE — Transfer of Care (Signed)
Immediate Anesthesia Transfer of Care Note  Patient: [name]  Procedure(s) Performed:  IRRIGATION AND DEBRIDEMENT EXTREMITY  Patient Location: PACU  Anesthesia Type: General  Level of Consciousness: awake  Airway & Oxygen Therapy: Patient Spontanous Breathing and Patient connected to nasal cannula oxygen  Post-op Assessment: Report given to PACU RN and Post -op Vital signs reviewed and stable  Post vital signs: stable  Complications: No apparent anesthesia complications

## 2011-12-24 NOTE — ED Provider Notes (Signed)
Spoke with Dr. Ophelia Charter (on call for [facility])--he requests an MRI of forearm--if abscess present, he will come in to drain.  If no abscess, he may be discharged home with doxycline.  8:43 PM MRI reveals large, deep abscess.  Dr. Ophelia Charter aware--will be in to take patient to surgery around 2230.   Jimmye Norman, NP 12/24/11 2239

## 2011-12-25 MED ORDER — METHADONE HCL 10 MG/ML PO CONC: 30.0000 mg | Freq: Every day | ORAL | Status: DC

## 2011-12-25 MED ORDER — OXYCODONE-ACETAMINOPHEN 5-325 MG PO TABS
1.0000 | ORAL_TABLET | ORAL | Status: DC | PRN
  Administered 2011-12-27: 2 via ORAL
  Filled 2011-12-25: qty 2

## 2011-12-25 MED ORDER — ONDANSETRON HCL 4 MG/2ML IJ SOLN: 4.0000 mg | Freq: Four times a day (QID) | INTRAMUSCULAR | Status: DC | PRN

## 2011-12-25 MED ORDER — ONDANSETRON HCL 4 MG PO TABS: 4.0000 mg | ORAL_TABLET | Freq: Four times a day (QID) | ORAL | Status: DC | PRN

## 2011-12-25 MED ORDER — IBUPROFEN 200 MG PO TABS
200.0000 mg | ORAL_TABLET | Freq: Every day | ORAL | Status: DC
  Administered 2011-12-25 – 2011-12-28 (×4): 200 mg via ORAL
  Filled 2011-12-25 (×4): qty 1

## 2011-12-25 MED ORDER — NICOTINE 21 MG/24HR TD PT24
21.0000 mg | MEDICATED_PATCH | Freq: Every day | TRANSDERMAL | Status: DC
  Administered 2011-12-25 – 2011-12-28 (×4): 21 mg via TRANSDERMAL
  Filled 2011-12-25 (×4): qty 1

## 2011-12-25 MED ORDER — VANCOMYCIN HCL IN DEXTROSE 1-5 GM/200ML-% IV SOLN
1000.0000 mg | Freq: Three times a day (TID) | INTRAVENOUS | Status: DC
  Administered 2011-12-25 – 2011-12-28 (×9): 1000 mg via INTRAVENOUS
  Filled 2011-12-25 (×11): qty 200

## 2011-12-25 MED ORDER — METHADONE HCL 10 MG PO TABS
30.0000 mg | ORAL_TABLET | Freq: Every day | ORAL | Status: DC
  Administered 2011-12-25 – 2011-12-28 (×4): 30 mg via ORAL
  Filled 2011-12-25 (×4): qty 3

## 2011-12-25 MED ORDER — METOCLOPRAMIDE HCL 5 MG/ML IJ SOLN
5.0000 mg | Freq: Three times a day (TID) | INTRAMUSCULAR | Status: DC | PRN
  Filled 2011-12-25: qty 2

## 2011-12-25 MED ORDER — PIPERACILLIN-TAZOBACTAM 3.375 G IVPB
3.3750 g | Freq: Three times a day (TID) | INTRAVENOUS | Status: DC
  Administered 2011-12-25 – 2011-12-28 (×10): 3.375 g via INTRAVENOUS
  Filled 2011-12-25 (×12): qty 50

## 2011-12-25 MED ORDER — METOCLOPRAMIDE HCL 10 MG PO TABS: 5.0000 mg | ORAL_TABLET | Freq: Three times a day (TID) | ORAL | Status: DC | PRN

## 2011-12-25 MED ORDER — VANCOMYCIN HCL IN DEXTROSE 1-5 GM/200ML-% IV SOLN
1000.0000 mg | Freq: Two times a day (BID) | INTRAVENOUS | Status: DC
  Administered 2011-12-25: 1000 mg via INTRAVENOUS
  Filled 2011-12-25 (×2): qty 200

## 2011-12-25 NOTE — Progress Notes (Addendum)
Subjective  Pain better.  Objective: Vital signs in last 24 hours: Temp:  [98.1 F (36.7 C)-100.3 F (37.9 C)] 99.4 F (37.4 C) (01/01 0308) Pulse Rate:  [70-93] 70  (01/01 0308) Resp:  [13-20] 18  (01/01 0308) BP: (103-133)/(56-89) 106/65 mmHg (01/01 0308) SpO2:  [93 %-100 %] 93 % (01/01 0308) Weight:  [79.379 kg (175 lb)] 175 lb (79.379 kg) (01/01 0105)  Intake/Output from previous day: 12/31 0701 - 01/01 0700 In: 1200 [I.V.:1200] Out: -  Intake/Output this shift:     Basename 12/24/11 1458  HGB 13.5    Basename 12/24/11 1458  WBC 10.5  RBC 4.60  HCT 39.8  PLT 264    Basename 12/24/11 1458  NA 136  K 4.3  CL 102  CO2 26  BUN 9  CREATININE 0.66  GLUCOSE 92  CALCIUM 9.6   No results found for this basename: LABPT:2,INR:2 in the last 72 hours  Neurologically intact Compartment soft  Assessment/Plan: Continue ABX , pending culture results. Previous cultures in Nov. Were neg.   His PCR in past has been negative.  Gm pos. Cocci in pairs on gram stain.    [name] C 12/25/2011, 11:36 AM

## 2011-12-25 NOTE — Op Note (Signed)
NAMEKAIDAN, HARPSTER               ACCOUNT NO.:  [id number]  MEDICAL RECORD NO.:  [id number]  LOCATION:  5008                         FACILITY:  [facility]  PHYSICIAN:  Shanika Levings C. Ophelia Charter, M.D.    DATE OF BIRTH:  1981-03-31  DATE OF PROCEDURE:  12/24/2011 DATE OF DISCHARGE:                              OPERATIVE REPORT   PREOPERATIVE DIAGNOSIS:  Recurrent right forearm abscess.  POSTOPERATIVE DIAGNOSIS:  Recurrent right forearm abscess.  PROCEDURE:  Irrigation and debridement of skin, subcutaneous tissue, muscle,  right forearm abscess.  ANESTHESIA:  GOT.  ESTIMATED BLOOD LOSS:  Minimal.  TOURNIQUET TIME:  Less than 30 minutes.  BRIEF HISTORY:  A 31 year old male who had previous I and D, forearm abscess, history of IV drug abuse.  He has been clean per the patient history since that time, had recurrent pain swelling that just developed.  He had some doxycycline treatment postop after 2 days of IV antibiotics and cultures were all negative.  MRI scan obtained after presentation in the emergency room showed a 7.5 x 4 cm abscess collection over the volar forearm extending down to the interosseous membrane.  After induction of general anesthesia and orotracheal intubation, prepping with DuraPrep with proximal arm tourniquet, a time-out procedure was completed.  Old incision was opened going through the volar tattoo on his forearm using the old incision.  Immediately, purulent material was obtained, and aerobic and anaerobic cultures were obtained.  Wound was opened further proximally and distally, a total of 10 cm.  This appeared to be a well-organized abscess with white necrotic tissue present, which was curetted out, cleaning out the area.  Some muscle was debrided.  There was some evidence of white necrotic changes from muscle necrosis in the past when he had a compartment release.  The muscles covered with fibrinous material had appearance of cooked crab meat or boiled lobsters  that had been taken out of the shell.  There was erythematous, small spicules, smoothed.  Bulb irrigation was used repetitively, continue scraping, use of rongeur for debridement of subcutaneous area.  Area of skin was excised.  Subcutaneous tissue was scraped with a curette.  Hemovac was placed through separate drain, stab incision, and then a soft dressing was applied.  The patient tolerated the procedure well, transferred to recovery room in stable condition.     Tedd Cottrill C. Ophelia Charter, M.D.     MCY/MEDQ  D:  12/24/2011  T:  12/25/2011  Job:  [id number]

## 2011-12-25 NOTE — Progress Notes (Signed)
ANTIBIOTIC CONSULT NOTE - INITIAL  Pharmacy Consult for  Vancomycin Indication:  Recurrent right forearm abcess  No Known Allergies  Patient Measurements:  Height 69 inches Weight 175 pounds (79.5kg)   Vital Signs: Temp: 99.8 F (37.7 C) (01/01 0127) Temp src: Oral (12/31 1825) BP: 103/64 mmHg (01/01 0127) Pulse Rate: 72  (01/01 0127) Intake/Output from previous day: 12/31 0701 - 01/01 0700 In: 1200 [I.V.:1200] Out: -  Intake/Output from this shift: Total I/O In: 1200 [I.V.:1200] Out: -   Labs:  Basename 12/24/11 1458  WBC 10.5  HGB 13.5  PLT 264  LABCREA --  CREATININE 0.66    Microbiology: No results found for this or any previous visit (from the past 720 hour(s)).  Medical History: Past Medical History  Diagnosis Date  . Hepatitis C   . Diverticulitis   . Colitis   . Drug abuse   . Anxiety   . Active smoker   . Depression   . MRSA (methicillin resistant staph aureus) culture positive   . 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     Medications:  Anti-infectives     Start     Dose/Rate Route Frequency Ordered Stop   12/25/11 0238   piperacillin-tazobactam (ZOSYN) IVPB 3.375 g        3.375 g 12.5 mL/hr over 240 Minutes Intravenous 3 times per day 12/25/11 0238     12/24/11 2300   ceFAZolin (ANCEF) IVPB 2 g/50 mL premix        2 g 100 mL/hr over 30 Minutes Intravenous  Once 12/24/11 2206     12/24/11 1527   clindamycin (CLEOCIN) 600 MG/4ML injection     Comments: BROWN, EMILY: cabinet override         12/24/11 1527 12/25/11 0329   12/24/11 1515   clindamycin (CLEOCIN) IVPB 600 mg        600 mg 100 mL/hr over 30 Minutes Intravenous  Once 12/24/11 1459 12/24/11 1600         Assessment: 31 yo with history of recurrent arm abscess.  Radiology report reveals " Large abscess deep to the fascia of the proximal right forearm with adjacent myositis and cellulitis".    He has received IV antibiotics (Clindamycin, Cefazolin and Zosyn).  He is s/p I & D today  and plan now is to add Vancomycin to his regimen.  Goal of Therapy:  Vancomycin trough 10-15 mcg/ml   Plan:  Begin IV Vancomycin 1 gm every 12 hours Check steady state levels and adjust accordingly  Nadara Mustard, PharmD., MS Clinical Pharmacist [phone] 12/25/2011,2:57 AM

## 2011-12-26 ENCOUNTER — Encounter (HOSPITAL_COMMUNITY): Payer: Self-pay | Admitting: Orthopaedic Surgery

## 2011-12-26 MED ORDER — METHOCARBAMOL 500 MG PO TABS
500.0000 mg | ORAL_TABLET | Freq: Four times a day (QID) | ORAL | Status: DC | PRN
  Administered 2011-12-26 – 2011-12-28 (×9): 500 mg via ORAL
  Filled 2011-12-26 (×9): qty 1

## 2011-12-26 MED ORDER — METHOCARBAMOL 100 MG/ML IJ SOLN
500.0000 mg | Freq: Four times a day (QID) | INTRAVENOUS | Status: DC | PRN
  Filled 2011-12-26: qty 5

## 2011-12-26 NOTE — Progress Notes (Signed)
Utilization review completed. Evalyse Stroope, RN, BSN. 12/26/11 

## 2011-12-26 NOTE — Progress Notes (Signed)
Subjective: 2 Days Post-Op Procedure(s) (LRB): IRRIGATION AND DEBRIDEMENT EXTREMITY (Right) Patient reports pain as moderate.   PCA helps pain.   Drain present and starting to settle out with serous fluid.  No purulence or bleeding.  Objective: Vital signs in last 24 hours: Temp:  [98.3 F (36.8 C)-99.5 F (37.5 C)] 98.4 F (36.9 C) (01/02 0602) Pulse Rate:  [63-80] 63  (01/02 0602) Resp:  [18-20] 18  (01/02 0602) BP: (97-109)/(55-65) 109/59 mmHg (01/02 0602) SpO2:  [95 %-98 %] 96 % (01/02 0602)  Intake/Output from previous day: 01/01 0701 - 01/02 0700 In: 650 [I.V.:600; IV Piggyback:50] Out: 1100 [Urine:1100] Intake/Output this shift:     Basename 12/24/11 1458  HGB 13.5    Basename 12/24/11 1458  WBC 10.5  RBC 4.60  HCT 39.8  PLT 264    Basename 12/24/11 1458  NA 136  K 4.3  CL 102  CO2 26  BUN 9  CREATININE 0.66  GLUCOSE 92  CALCIUM 9.6   No results found for this basename: LABPT:2,INR:2 in the last 72 hours  Incision: dressing C/D/I Moving fingers well.  Assessment/Plan: 2 Days Post-Op Procedure(s) (LRB): IRRIGATION AND DEBRIDEMENT EXTREMITY (Right) Continue ABX therapy due to Post-op infection, actually not post op, but cellulitis and abscess of right arm. Cultures pending final reading and sensitivity report Continue PCA and switch to po meds once drain out.  Robaxin for spasm  Adali Pennings M 12/26/2011, 8:32 AM

## 2011-12-27 LAB — CULTURE, ROUTINE-ABSCESS

## 2011-12-27 LAB — TISSUE CULTURE

## 2011-12-27 MED ORDER — OXYCODONE HCL 5 MG PO TABS
5.0000 mg | ORAL_TABLET | ORAL | Status: DC | PRN
  Administered 2011-12-27: 10 mg via ORAL
  Administered 2011-12-27 – 2011-12-28 (×5): 15 mg via ORAL
  Filled 2011-12-27 (×5): qty 3
  Filled 2011-12-27: qty 2
  Filled 2011-12-27: qty 3

## 2011-12-27 NOTE — Progress Notes (Signed)
Subjective: C/o pain although appears comfortable and pain score for nursing staff was only a 3. Still using PCA but much less. Final culture and sens pending.  Thus far moderate staph aureus   Objective: Vital signs in last 24 hours: Temp:  [97.5 F (36.4 C)-98.7 F (37.1 C)] 97.9 F (36.6 C) (01/03 0542) Pulse Rate:  [57-69] 62  (01/03 0542) Resp:  [18-20] 20  (01/03 0550) BP: (101-121)/(55-71) 111/55 mmHg (01/03 0542) SpO2:  [95 %-100 %] 99 % (01/03 0550)  Intake/Output from previous day: 01/02 0701 - 01/03 0700 In: 3280 [P.O.:1480; I.V.:1500; IV Piggyback:300] Out: 2300 [Urine:2300] Intake/Output this shift:     Basename 12/24/11 1458  HGB 13.5    Basename 12/24/11 1458  WBC 10.5  RBC 4.60  HCT 39.8  PLT 264    Basename 12/24/11 1458  NA 136  K 4.3  CL 102  CO2 26  BUN 9  CREATININE 0.66  GLUCOSE 92  CALCIUM 9.6   No results found for this basename: LABPT:2,INR:2 in the last 72 hours  Neurovascular intact Sensation intact distally Incision: dressing C/D/I Drain removed today without difficulty. Only serous drainage.  Removed ACE and will change dressing before discharge.   Advised pt to keep it dry and clean Assessment/Plan: DC PCA today and use oral analgesics SL IVF Await final culture today and plan for discharge tomorrow with oral antibiotics.  Used Doxycycline last time without difficulties Change dressing prior to discharge   [name] M 12/27/2011, 9:03 AM

## 2011-12-28 MED ORDER — MINOCYCLINE HCL 100 MG PO CAPS
100.0000 mg | ORAL_CAPSULE | Freq: Two times a day (BID) | ORAL | Status: DC
  Filled 2011-12-28 (×2): qty 1

## 2011-12-28 MED ORDER — DOXYCYCLINE HYCLATE 100 MG PO TABS: 100.0000 mg | ORAL_TABLET | Freq: Two times a day (BID) | ORAL | Status: DC

## 2011-12-28 NOTE — Progress Notes (Signed)
Subjective: Comfortable today.  Wants to shower.  Final cultures show MRSA.  Pt request oxycodone without acetaminophen for home.  Per Dr Ophelia Charter pt may have percocet for home use. Pt with h/o drug use and could use plain oxycodone in an unsafe manner  Objective: Vital signs in last 24 hours: Temp:  [97.9 F (36.6 C)-98.1 F (36.7 C)] 98.1 F (36.7 C) (01/04 0523) Pulse Rate:  [50-70] 50  (01/04 0523) Resp:  [18] 18  (01/04 0523) BP: (116-119)/(63-64) 116/63 mmHg (01/04 0523) SpO2:  [96 %-97 %] 97 % (01/04 0523)  Intake/Output from previous day: 01/03 0701 - 01/04 0700 In: 480 [P.O.:480] Out: 1800 [Urine:1800] Intake/Output this shift:    No results found for this basename: HGB:5 in the last 72 hours No results found for this basename: WBC:2,RBC:2,HCT:2,PLT:2 in the last 72 hours No results found for this basename: NA:2,K:2,CL:2,CO2:2,BUN:2,CREATININE:2,GLUCOSE:2,CALCIUM:2 in the last 72 hours No results found for this basename: LABPT:2,INR:2 in the last 72 hours  Neurovascular intact Incision: no drainage  Assessment/Plan: 1. Left forearm MRSA abscess.  Sensitive to Doxycycline Plan:  Will discharge today to home with RX for Doxycycline and percocet.  Pt to follow up with Dr Ophelia Charter in one week.  He is advised to keep his incision dry and clean at all times. No use of left hand or arm.  Elevation at rest   Dragan Tamburrino M 12/28/2011, 12:39 PM

## 2011-12-28 NOTE — Discharge Summary (Signed)
Physician Discharge Summary  Patient ID: Marshel Golubski MRN: [id number] DOB/AGE: 1981/07/29 31 y.o.  Admit date: 12/24/2011 Discharge date: 12/28/2011  Admission Diagnoses:  Abscess of right forearm Cultures  MRSA positive  Discharge Diagnoses:  Principal Problem:  *Abscess of right forearm cultures MRSA positive  Past Medical History  Diagnosis Date  . Hepatitis C   . Diverticulitis   . Colitis   . Drug abuse   . Anxiety   . Active smoker   . Depression   . MRSA (methicillin resistant staph aureus) culture positive   . 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 male who was admitted 12/24/2011 with a diagnosis of Abscess of right forearm and went to the operating room on 12/24/2011 - 12/25/2011 and underwent the above named procedures.   Drain was placed in incision during surgery and remained intact until essentially no further drainage and then removed without difficulty.  C&S with MRSA sensitive to doxycycline.  Pt discharged to home with doxycycline and percocet.  Wound without drainage at discharge and no further signs of cellulitis.  He will continue elevation at rest. He was given perioperative antibiotics:  Anti-infectives     Start     Dose/Rate Route Frequency Ordered Stop   12/28/11 1200   minocycline (MINOCIN,DYNACIN) capsule 100 mg        100 mg Oral 2 times daily 12/28/11 1115     12/28/11 1015   doxycycline (VIBRA-TABS) tablet 100 mg  Status:  Discontinued        100 mg Oral Every 12 hours 12/28/11 1012 12/28/11 1114   12/25/11 1200   vancomycin (VANCOCIN) IVPB 1000 mg/200 mL premix  Status:  Discontinued        1,000 mg 200 mL/hr over 60 Minutes Intravenous Every 8 hours 12/25/11 0914 12/28/11 1012   12/25/11 0400   vancomycin (VANCOCIN) IVPB 1000 mg/200 mL premix   Status:  Discontinued        1,000 mg 200 mL/hr over 60 Minutes Intravenous Every 12 hours 12/25/11 0315 12/25/11 0914   12/25/11 0238   piperacillin-tazobactam (ZOSYN) IVPB 3.375 g  Status:  Discontinued        3.375 g 12.5 mL/hr over 240 Minutes Intravenous 3 times per day 12/25/11 0238 12/28/11 1012   12/24/11 2300   ceFAZolin (ANCEF) IVPB 2 g/50 mL premix  Status:  Discontinued        2 g 100 mL/hr over 30 Minutes Intravenous  Once 12/24/11 2206 12/28/11 1012   12/24/11 1527   clindamycin (CLEOCIN) 600 MG/4ML injection     Comments: BROWN, EMILY: cabinet override         12/24/11 1527 12/25/11 0329   12/24/11 1515   clindamycin (CLEOCIN) IVPB 600 mg        600 mg 100 mL/hr over 30 Minutes Intravenous  Once 12/24/11 1459 12/24/11 1600        .  He was given sequential compression devices, early ambulation, and chemoprophylaxis for DVT prophylaxis.  He benefited maximally from their hospital stay and there were no complications.    Recent vital signs:  Filed Vitals:   12/28/11 0523  BP: 116/63  Pulse: 50  Temp: 98.1 F (36.7 C)  Resp: 18    Recent laboratory studies:  Lab Results  Component Value Date   HGB 13.5  12/24/2011   HGB 14.0 11/27/2011   HGB 11.9* 11/12/2011   Lab Results  Component Value Date   WBC 10.5 12/24/2011   PLT 264 12/24/2011   Lab Results  Component Value Date   INR 1.03 08/01/2011   Lab Results  Component Value Date   NA 136 12/24/2011   K 4.3 12/24/2011   CL 102 12/24/2011   CO2 26 12/24/2011   BUN 9 12/24/2011   CREATININE 0.66 12/24/2011   GLUCOSE 92 12/24/2011    Discharge Medications:   Current Discharge Medication List    CONTINUE these medications which have NOT CHANGED   Details  ibuprofen (ADVIL,MOTRIN) 200 MG tablet Take 200 mg by mouth daily.      methadone (DOLOPHINE) 10 MG/ML solution Take 30 mg by mouth daily.          Diagnostic Studies: Dg Forearm Right  12/24/2011  *RADIOLOGY REPORT*  Clinical Data:  Soft tissue swelling of the forearm.  Status post surgery.  RIGHT FOREARM - 2 VIEW  Comparison: None available.  Findings: There is some prominence of the soft tissues over the ventral aspect of the forearm.  No subcutaneous gas is evident. The underlying osseous structures are within normal limits.  IMPRESSION: Soft tissue swelling within the forearm without evidence for gas or underlying osseous abnormality.  Original Report Authenticated By: Jamesetta Orleans. MATTERN, M.D.   Mr Forearm Right Wo/w Cm  12/24/2011  *RADIOLOGY REPORT*  Clinical Data: Forearm abscess after injecting heroin.  MRI OF THE RIGHT FOREARM WITHOUT AND WITH CONTRAST  Technique:  Multiplanar, multisequence MR imaging was performed both before and after administration of intravenous contrast.  Contrast: 14mL MULTIHANCE GADOBENATE DIMEGLUMINE 529 MG/ML IV SOLN  Comparison: Radiographs dated 12/24/2011  Findings: The patient has a 7.3 x 3.3 x 2.0 cm abscess in the anterior lateral aspect of the right forearm. The abscess lies between the edematous brachioradialis muscle and the flexor carpi radialis muscle.  Both of those muscles are abnormal suggesting myositis.  There is overlying edema and abnormal soft tissue enhancement consistent with adjacent cellulitis. The abscess appears to be a deep to the fascia.  No abnormal osseous enhancement to suggest osteomyelitis.  There is no elbow joint effusion.  IMPRESSION:  Large abscess deep to the fascia of the proximal right forearm with adjacent myositis and cellulitis.  Original Report Authenticated By: Gwynn Burly, M.D.    Disposition: Home or Self Care  Discharge Orders    Future Orders Please Complete By Expires   Diet - low sodium heart healthy      Call MD / Call 911      Comments:   If you experience chest pain or shortness of breath, CALL 911 and be transported to the hospital emergency room.  If you develope a fever above 101 F, pus (white drainage) or increased drainage or  redness at the wound, or calf pain, call your surgeon's office.   Constipation Prevention      Comments:   Drink plenty of fluids.  Prune juice may be helpful.  You may use a stool softener, such as Colace (over the counter) 100 mg twice a day.  Use MiraLax (over the counter) for constipation as needed.   Increase activity slowly as tolerated      Discharge instructions      Comments:   Keep wound dry and clean at all times.  Keep arm elevated when at rest.  No use of left hand.  Follow-up Information    Follow up with YATES,MARK C. Make an appointment in 1 week.   Contact information:   [facility] [address] [place] [address] [phone]           Signed: Wende Neighbors 12/28/2011, 12:44 PM

## 2011-12-28 NOTE — Plan of Care (Signed)
Problem: Discharge Progression Outcomes Goal: Other Discharge Outcomes/Goals Dressing changed, incision approximated

## 2011-12-29 LAB — ANAEROBIC CULTURE

## 2012-01-27 ENCOUNTER — Emergency Department (HOSPITAL_COMMUNITY)
Admission: EM | Admit: 2012-01-27 | Discharge: 2012-01-27 | Disposition: A | Discharge status: Home / Self Care | Payer: Self-pay | Attending: Emergency Medicine | Admitting: Emergency Medicine

## 2012-01-27 ENCOUNTER — Encounter (HOSPITAL_COMMUNITY): Payer: Self-pay | Admitting: *Deleted

## 2012-01-27 DIAGNOSIS — IMO0002 Reserved for concepts with insufficient information to code with codable children: Secondary | ICD-10-CM | POA: Insufficient documentation

## 2012-01-27 DIAGNOSIS — Z8614 Personal history of Methicillin resistant Staphylococcus aureus infection: Secondary | ICD-10-CM | POA: Insufficient documentation

## 2012-01-27 DIAGNOSIS — Z8619 Personal history of other infectious and parasitic diseases: Secondary | ICD-10-CM | POA: Insufficient documentation

## 2012-01-27 DIAGNOSIS — L02413 Cutaneous abscess of right upper limb: Secondary | ICD-10-CM

## 2012-01-27 DIAGNOSIS — F17200 Nicotine dependence, unspecified, uncomplicated: Secondary | ICD-10-CM | POA: Insufficient documentation

## 2012-01-27 MED ORDER — LIDOCAINE HCL 2 % IJ SOLN
5.0000 mL | Freq: Once | INTRAMUSCULAR | Status: DC
  Filled 2012-01-27: qty 1

## 2012-01-27 MED ORDER — SULFAMETHOXAZOLE-TRIMETHOPRIM 800-160 MG PO TABS: 1.0000 | ORAL_TABLET | Freq: Two times a day (BID) | ORAL | Status: DC

## 2012-01-27 MED ORDER — OXYCODONE-ACETAMINOPHEN 5-325 MG PO TABS: 1.0000 | ORAL_TABLET | ORAL | Status: DC | PRN

## 2012-01-27 NOTE — ED Notes (Signed)
Vital signs stable. 

## 2012-01-27 NOTE — ED Notes (Signed)
MD at bedside. 

## 2012-01-27 NOTE — ED Notes (Signed)
Pt from home c/o reddened, swollen and painful area to right antecubital area that was first noticed 3 days ago. Pt reports hx of 2 surgeries about a month ago on anterior forearm due to an infection ("MRSA"). Pt also endorses nausea but no vomiting.

## 2012-01-27 NOTE — ED Notes (Signed)
Patient is resting comfortably. 

## 2012-01-27 NOTE — ED Provider Notes (Signed)
History     CSN: [id number]  Arrival date & time 01/27/12  1450   First MD Initiated Contact with Patient 01/27/12 1503      Chief Complaint  Patient presents with  . Wound Check    (Consider location/radiation/quality/duration/timing/severity/associated sxs/prior treatment) Patient is a 31 y.o. male presenting with wound check. The history is provided by the patient.  Wound Check   One month ago, he had an abscess of his right arm drained in the hospital. He had been doing well until 3 days ago when he noticed a bump coming up on his right elbow. It is gotten bigger and started draining today. He denies fever, chills, sweats. Pain is moderate to severe and he rates it a 7/10. Pain is worse with palpation worse with movement at which point he gets to 10 out of 10. He has a history of MRSA infection.  Past Medical History  Diagnosis Date  . Hepatitis C   . Diverticulitis   . Colitis   . Drug abuse   . Anxiety   . Active smoker   . Depression   . MRSA (methicillin resistant staph aureus) culture positive   . 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 I&d extremity 11/10/2011    Procedure: IRRIGATION AND DEBRIDEMENT EXTREMITY;  Surgeon: Kathryne Hitch;  Location: [facility] OR;  Service: Orthopedics;  Laterality: Right;  . I&d extremity 11/10/2011    Procedure: IRRIGATION AND DEBRIDEMENT EXTREMITY;  Surgeon: Kathryne Hitch;  Location: [facility] OR;  Service: Orthopedics;  Laterality: Right;  with volar compartment release  . I&d extremity 12/24/2011    Procedure: IRRIGATION AND DEBRIDEMENT EXTREMITY;  Surgeon: Eldred Manges;  Location: [facility] OR;  Service: Orthopedics;  Laterality: Right;  . Colonoscopy     History reviewed. No pertinent family history.  History  Substance Use Topics  . Smoking status: Current Everyday Smoker -- 1.0 packs/day for 18 years    Types: Cigarettes  . Smokeless tobacco: Never Used  . Alcohol Use: No       Review of Systems  All other systems reviewed and are negative.    Allergies  Review of patient's allergies indicates no known allergies.  Home Medications   Current Outpatient Rx  Name Route Sig Dispense Refill  . IBUPROFEN 200 MG PO TABS Oral Take 200 mg by mouth daily.      Marland Kitchen METHADONE HCL 10 MG/ML PO CONC Oral Take 30 mg by mouth daily.        BP 131/88  Pulse 105  Temp(Src) 98.3 F (36.8 C) (Oral)  Resp 18  Wt 180 lb (81.647 kg)  SpO2 100%  Physical Exam  Nursing note and vitals reviewed.  31 year old male is resting comfortably in no acute distress. Vital signs are significant for mild tachycardia heart rate of 105. Oxygen saturation is 100% which is normal. Head is normal cephalic and atraumatic. PERRLA, EOMI. Oropharynx is clear. Neck is nontender and supple without adenopathy. Back is nontender. Lungs are clear without rales, wheezes, rhonchi. Heart has regular rate and rhythm without murmur. Abdomen is soft, flat, nontender without masses or hepatosplenomegaly. Extremities: Right arm has an incision on the volar surface which is healing appropriately. On the lateral aspect of the volar surface of the right elbow is a thin abscess which is about 3 cm x 1 cm with some opening in the skin. There is no drainage. It is moderately to severely tender to palpation. There  is some overlying erythema. No lymphangitic streaks are seen. Remainder of extremity exam is normal. Neurologic: Mental status is normal, cranial nerves are intact, there no focal motor or sensory deficits.  ED Course  INCISION AND DRAINAGE Date/Time: 01/27/2012 3:45 PM Performed by: Dione Booze Authorized by: Preston Fleeting, Shefali Ng Consent: Verbal consent obtained. Written consent not obtained. Risks and benefits: risks, benefits and alternatives were discussed Consent given by: patient Patient understanding: patient states understanding of the procedure being performed Patient consent: the patient's  understanding of the procedure matches consent given Procedure consent: procedure consent matches procedure scheduled Relevant documents: relevant documents present and verified Site marked: the operative site was marked Required items: required blood products, implants, devices, and special equipment available Patient identity confirmed: verbally with patient and arm band Time out: Immediately prior to procedure a "time out" was called to verify the correct patient, procedure, equipment, support staff and site/side marked as required. Type: abscess Body area: upper extremity Location details: right elbow Anesthesia: local infiltration Local anesthetic: lidocaine 2% without epinephrine Patient sedated: no Scalpel size: 11 Incision type: single straight Complexity: simple Drainage: purulent Drainage amount: scant Packing material: 1/4 in iodoform gauze Patient tolerance: Patient tolerated the procedure well with no immediate complications.   (including critical care time)  Because he recently had a course of doxycycline, does decided to send him home on Bactrim. Is given a prescription for Bactrim DS to take twice a day and also given a prescription for Percocet to take as needed for pain. He will need to return in 2 days for packing removal and wound check.  1. Abscess of right arm       MDM  Old records are reviewed. He had drainage of an abscess of his right forearm with compartment release.        Dione Booze, MD 01/27/12 [phone]

## 2012-01-29 ENCOUNTER — Observation Stay (HOSPITAL_COMMUNITY)
Admission: EM | Admit: 2012-01-29 | Discharge: 2012-01-29 | Disposition: A | Discharge status: Home / Self Care | Payer: Self-pay | Attending: Emergency Medicine | Admitting: Emergency Medicine

## 2012-01-29 ENCOUNTER — Encounter (HOSPITAL_COMMUNITY): Payer: Self-pay | Admitting: *Deleted

## 2012-01-29 DIAGNOSIS — IMO0002 Reserved for concepts with insufficient information to code with codable children: Principal | ICD-10-CM | POA: Insufficient documentation

## 2012-01-29 DIAGNOSIS — L03113 Cellulitis of right upper limb: Secondary | ICD-10-CM

## 2012-01-29 DIAGNOSIS — L02413 Cutaneous abscess of right upper limb: Secondary | ICD-10-CM

## 2012-01-29 DIAGNOSIS — B1920 Unspecified viral hepatitis C without hepatic coma: Secondary | ICD-10-CM | POA: Insufficient documentation

## 2012-01-29 DIAGNOSIS — Z8614 Personal history of Methicillin resistant Staphylococcus aureus infection: Secondary | ICD-10-CM | POA: Insufficient documentation

## 2012-01-29 LAB — CBC
HCT: 39.4 % (ref 39.0–52.0)
Hemoglobin: 13.6 g/dL (ref 13.0–17.0)
MCH: 29.4 pg (ref 26.0–34.0)
MCHC: 34.5 g/dL (ref 30.0–36.0)
RBC: 4.62 MIL/uL (ref 4.22–5.81)

## 2012-01-29 LAB — BASIC METABOLIC PANEL
BUN: 10 mg/dL (ref 6–23)
Chloride: 99 mEq/L (ref 96–112)
Creatinine, Ser: 0.54 mg/dL (ref 0.50–1.35)
GFR calc Af Amer: >90 mL/min (ref >90)
Glucose, Bld: 89 mg/dL (ref 70–99)
Potassium: 4 mEq/L (ref 3.5–5.1)

## 2012-01-29 LAB — DIFFERENTIAL
Basophils Relative: 0 % (ref 0–1)
Lymphs Abs: 1.9 10*3/uL (ref 0.7–4.0)
Monocytes Absolute: 0.9 10*3/uL (ref 0.1–1.0)
Monocytes Relative: 8 % (ref 3–12)
Neutro Abs: 8.6 10*3/uL — ABNORMAL HIGH (ref 1.7–7.7)
Neutrophils Relative %: 73 % (ref 43–77)

## 2012-01-29 MED ORDER — ACETAMINOPHEN 325 MG PO TABS: 650.0000 mg | ORAL_TABLET | ORAL | Status: DC | PRN

## 2012-01-29 MED ORDER — CLINDAMYCIN HCL 150 MG PO CAPS: 150.0000 mg | ORAL_CAPSULE | Freq: Four times a day (QID) | ORAL | Status: AC

## 2012-01-29 MED ORDER — CLINDAMYCIN PHOSPHATE 600 MG/50ML IV SOLN
600.0000 mg | Freq: Three times a day (TID) | INTRAVENOUS | Status: DC
  Administered 2012-01-29 (×2): 600 mg via INTRAVENOUS
  Filled 2012-01-29 (×4): qty 50

## 2012-01-29 MED ORDER — CLINDAMYCIN PHOSPHATE 600 MG/4ML IJ SOLN
INTRAMUSCULAR | Status: AC
  Filled 2012-01-29: qty 4

## 2012-01-29 MED ORDER — DEXTROSE 5 % IV SOLN
INTRAVENOUS | Status: AC
  Filled 2012-01-29: qty 50

## 2012-01-29 MED ORDER — MORPHINE SULFATE 4 MG/ML IJ SOLN
4.0000 mg | Freq: Once | INTRAMUSCULAR | Status: AC
  Administered 2012-01-29: 4 mg via INTRAVENOUS
  Filled 2012-01-29: qty 1

## 2012-01-29 MED ORDER — CLINDAMYCIN PHOSPHATE 600 MG/50ML IV SOLN: 600.0000 mg | Freq: Once | INTRAVENOUS | Status: DC

## 2012-01-29 MED ORDER — HYDROMORPHONE HCL PF 1 MG/ML IJ SOLN
1.0000 mg | Freq: Once | INTRAMUSCULAR | Status: AC
  Administered 2012-01-29: 1 mg via INTRAVENOUS
  Filled 2012-01-29: qty 1

## 2012-01-29 MED ORDER — MORPHINE SULFATE 4 MG/ML IJ SOLN
4.0000 mg | INTRAMUSCULAR | Status: DC | PRN
  Administered 2012-01-29 (×2): 4 mg via INTRAVENOUS
  Filled 2012-01-29 (×2): qty 1

## 2012-01-29 MED ORDER — ZOLPIDEM TARTRATE 5 MG PO TABS: 5.0000 mg | ORAL_TABLET | Freq: Every evening | ORAL | Status: DC | PRN

## 2012-01-29 MED ORDER — NICOTINE 21 MG/24HR TD PT24
MEDICATED_PATCH | TRANSDERMAL | Status: AC
  Filled 2012-01-29: qty 1

## 2012-01-29 MED ORDER — ONDANSETRON HCL 4 MG/2ML IJ SOLN: 4.0000 mg | Freq: Four times a day (QID) | INTRAMUSCULAR | Status: DC | PRN

## 2012-01-29 MED ORDER — NICOTINE 7 MG/24HR TD PT24
7.0000 mg | MEDICATED_PATCH | TRANSDERMAL | Status: DC
  Administered 2012-01-29: 7 mg via TRANSDERMAL
  Filled 2012-01-29: qty 1

## 2012-01-29 NOTE — ED Provider Notes (Signed)
Assume patient care from [facility], PA-C at end of shift. 31 year old male with history of IV drug use, hep C, MRSA who is currently in CDU under cellulitis protocol secondary to failed outpatient treatment of a right antecubital fossa abscess/cellulitis. Patient had been receiving Bactrim and Doxy at home, however his infection is worsen.  Pt will be receiving 2 doses of IV clindamycin. He denies nausea, vomiting. He denies history of diabetes.  5:37 PM Patient has received the first set of IV antibiotic. The patient states he does not notice any improvement, but has noticed that the red streaking is extending further to his upper arm. He continues to endorse pain. He also requests for a nicotine patch. Patient otherwise in no acute distress. On examination he is alert and oriented, heart is of regular rate and rhythm, lungs clear to auscultation bilaterally, abdomen is soft and nontender. Right upper arm with evidence of erythema superior to antecubital region extending above marked area, tender on palpation. He is afebrile.  9:13 PM As a second dose of IV clindamycin, patient has not noticed any improvement both with pain or with associated redness. He is otherwise in no acute distress. I am hesitant to send this patient home due to his complicated medical history including MRSA, history of hep C, and prior right forearm compartmental release procedure. I will discuss with my attending to formulate a plan  9:53 PM My attending was seen and evaluated the patient. He felt that patient is safe to be discharged with continual by mouth clindamycin. He is encouraged to return 2 days for wound recheck, sooner if developing increased fever, nausea vomiting. Patient voiced understanding and agree with plan.  Fayrene Helper, PA-C 01/29/12 2154   10:07 PM Patient states he has no money and cannot afford his antibiotic. I've spoken with the nurse to request case management to find a way to get his medication  free of charge.  Fayrene Helper, PA-C 01/30/12 2341

## 2012-01-29 NOTE — ED Notes (Signed)
Pt given coke 

## 2012-01-29 NOTE — ED Notes (Signed)
Pt has history of ivdrug use and had right arm surgery for abscess and then abscess came up lateral right ac area.  Lanced at [facility] two days ago and back with increasing pain, redness

## 2012-01-29 NOTE — ED Provider Notes (Signed)
History     CSN: [id number]  Arrival date & time 01/29/12  0805   First MD Initiated Contact with Patient 01/29/12 [phone]      Chief Complaint  Patient presents with  . Abscess    right AC area   HPI Notes increasing erythema/swelling surrounding wound site for the last 2 days.  Has been taking Bactrim DS BID, and doxy prior.  10/10 severity, throbbing/stabbing, no radiation, no alleviating factors, worse with mvmt.  Denies fever, chills.  Endorses night sweats x 2 nights.  Denies other wounds on body but does have history of MRSA.  Denies cough/SOB/CP/palpitations.   Past Medical History  Diagnosis Date  . Hepatitis C   . Diverticulitis   . Colitis   . Drug abuse   . Anxiety   . Active smoker   . Depression   . MRSA (methicillin resistant staph aureus) culture positive   . 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 I&d extremity 11/10/2011    Procedure: IRRIGATION AND DEBRIDEMENT EXTREMITY;  Surgeon: Kathryne Hitch;  Location: [facility];  Service: Orthopedics;  Laterality: Right;  . I&d extremity 11/10/2011    Procedure: IRRIGATION AND DEBRIDEMENT EXTREMITY;  Surgeon: Kathryne Hitch;  Location: [facility];  Service: Orthopedics;  Laterality: Right;  with volar compartment release  . I&d extremity 12/24/2011    Procedure: IRRIGATION AND DEBRIDEMENT EXTREMITY;  Surgeon: Eldred Manges;  Location: [facility] OR;  Service: Orthopedics;  Laterality: Right;  . Colonoscopy    No family history on file.  History  Substance Use Topics  . Smoking status: Current Everyday Smoker -- 1.0 packs/day for 18 years    Types: Cigarettes  . Smokeless tobacco: Never Used  . Alcohol Use: No    Review of Systems  Constitutional: Positive for chills and diaphoresis. Negative for fever, activity change, appetite change, fatigue and unexpected weight change.  Eyes: Negative for pain and discharge.  Respiratory: Negative for apnea, cough, chest tightness, shortness  of breath and wheezing.   Cardiovascular: Negative for chest pain and palpitations.  Gastrointestinal: Negative for abdominal distention.  Genitourinary: Negative for dysuria and difficulty urinating.  Musculoskeletal: Negative for myalgias, joint swelling and arthralgias.  Skin: Positive for wound.  Neurological: Negative for dizziness, weakness, light-headedness and headaches.  Hematological: Negative for adenopathy.  Psychiatric/Behavioral: Negative for hallucinations, confusion and agitation.   Allergies  Review of patient's allergies indicates no known allergies.  Home Medications   Current Outpatient Rx  Name Route Sig Dispense Refill  . IBUPROFEN 200 MG PO TABS Oral Take 200 mg by mouth daily as needed. For pain    . METHADONE HCL 10 MG PO TABS Oral Take 65 mg by mouth daily.    . SULFAMETHOXAZOLE-TRIMETHOPRIM 800-160 MG PO TABS Oral Take 1 tablet by mouth every 12 (twelve) hours. First dose 01/27/2012. Take for 10 days.    . OXYCODONE-ACETAMINOPHEN 5-325 MG PO TABS Oral Take 1 tablet by mouth every 4 (four) hours as needed. For pain      BP 120/78  Pulse 88  Temp(Src) 98.7 F (37.1 C) (Oral)  Resp 18  Ht 5\' 8"  (1.727 m)  Wt 175 lb (79.379 kg)  BMI 26.61 kg/m2  SpO2 100%  Physical Exam  Constitutional: He is oriented to person, place, and time. He appears well-developed and well-nourished. He appears distressed.  HENT:  Head: Normocephalic and atraumatic.  Eyes: Conjunctivae and EOM are normal. Pupils are equal, round, and  reactive to light.  Neck: Normal range of motion. Neck supple.  Cardiovascular: Normal rate, regular rhythm, normal heart sounds and intact distal pulses.  Exam reveals no gallop and no friction rub.   No murmur heard. Pulmonary/Chest: Effort normal and breath sounds normal. No respiratory distress. He has no wheezes. He has no rales. He exhibits no tenderness.  Abdominal: Soft. Bowel sounds are normal. He exhibits no distension and no mass. There is  no tenderness. There is no rebound and no guarding.  Musculoskeletal:       Decreased range of motion of right elbow, wound is on anterior aspect, over distal bicep area, about 2cm circumferential erythema with blood tinged packing in place, skin directly surrounding wound is black, extremely tender to palpation; longitudinal scar on anterior forearm, about 10cm, from history of larger MRSA abscess I&D requiring stitches; no axillary lymphadenopathy/lymphangiitis  Neurological: He is alert and oriented to person, place, and time. No cranial nerve deficit.  Skin: Skin is warm and dry. He is not diaphoretic.  Psychiatric: He has a normal mood and affect. His behavior is normal. Thought content normal.    ED Course  Procedures (including critical care time)   Labs Reviewed  CBC  DIFFERENTIAL  BASIC METABOLIC PANEL   No results found.   No diagnosis found.    MDM  31yo M with PMH significant for MRSA + wounds p/w worsening RUE wound s/p I&D 2 days prior.  Has tried PO antibiotics (doxy and bactrim).  Will treat with clindamycin IV and monitor in CDU.        Vernice Jefferson, MD 01/29/12 1013

## 2012-01-29 NOTE — ED Notes (Signed)
Pt resting quietly no s/s of any pain or distress observed. Pt denies any pain or complaints at this time. Plan of care is updated with verbal understanding. Pt denies any increase in swelling or redness to right upper arm. Pt has packing in place and guaze dressing over packing, inform pt not to touch are for reduction of infection with verbal understanding. Will continue to monitor pt.

## 2012-01-29 NOTE — ED Provider Notes (Signed)
10:04 AM  Patient with a hx sig for IV drug abuse, Hep C, MRSA and diverticulitis was placed in CDU on cellulitis protocol by Dr. Milbert Coulter (resident working w Dr. Hyman Hopes). Pt returned to ED after failing I&D, doxy and Bactrim.  Patient is here for IV abx (clinda 600) and has received the first dose at 9:30AM. Patient re-evaluated and is resting comfortable, VSS, with no new complaints or concerns at this time. Plan per previous provider is to observe pt in CDU and have them receive at lest 2 full doses of IV abx. I have placed pt on the protocol and put in the 2nd dose to occur at 17:30. On exam: hemodynamically stable, afebrile, NAD, heart w/ RRR, lungs CTAB, Skin cellulitis marked w date & time, 1 cm round abscess located on antecubital fossa with 2.5 cm area of surrounding cellulitis. Chest & abd non-tender, no peripheral edema or calf tenderness.  3:48 PM Patient care to be resumed by Fayrene Helper, PA-C. Patient history has been discussed with midlevel resuming care. Patient has no tests pending, but will receive 2nd dose of abx. Disposition will be determined once re-evaluated after 2nd dose of clindamycin around 5-530PM.  Patient is currently asymptomatic, in NAD, VSS, and has no current complaints.    Jaci Carrel, [place] 01/29/12 1549

## 2012-01-29 NOTE — ED Notes (Signed)
Spoke with PA regarding pts 10/10 pain to right forearm, PA reports no meds to be given at this time. Pt updated. Pt watching tv. No needs at this time.

## 2012-01-29 NOTE — ED Notes (Signed)
I&D redone with fresh packing placed by PA.  Tolerated well by patient and redressed by Pat/RN.

## 2012-01-29 NOTE — ED Notes (Signed)
Pt was discharged, upon discharging pt states "how am i going to get the medicine. I cant afford it". Attempted to contact case management unsuccessfully and pt can call in a.m. For case management for assistance. P. A. Made aware and states pt will be admitted if medication are not certain, charge nurse made aware and charge RN and P.A. Will follow up. Will continue to monitor pt.

## 2012-01-30 NOTE — ED Provider Notes (Signed)
I saw and evaluated the patient, reviewed the resident's note and I agree with the findings and plan.  Per patient, worsening cellulitis surrounding site. Increased pain.   INCISION AND DRAINAGE Performed by: Forbes Cellar Consent: Verbal consent obtained. Risks and benefits: risks, benefits and alternatives were discussed Type: abscess  Body area: RUE  Anesthesia: local infiltration  Local anesthetic: lidocaine 1% without epinephrine  Anesthetic total: 2 ml  Complexity: complex Blunt dissection to break up loculations  Drainage: purulent  Drainage amount: minimal  Packing material: 1/4 in iodoform gauze  Patient tolerance: Patient tolerated the procedure well with no immediate complications.  To CDU for cellulitis protocol. Clindamycin ordered. Patient does not have insurance, if D/C home he will require case management for help with obtaining medication.     Forbes Cellar, MD 01/30/12 [phone]

## 2012-01-30 NOTE — ED Provider Notes (Signed)
Medical screening examination/treatment/procedure(s) were conducted as a shared visit with non-physician practitioner(s) and myself.  I personally evaluated the patient during the encounter   Forbes Cellar, MD 01/30/12 [phone]

## 2012-02-01 NOTE — ED Provider Notes (Signed)
Medical screening examination/treatment/procedure(s) were performed by non-physician practitioner and as supervising physician I was immediately available for consultation/collaboration.   Loren Racer, MD 02/01/12 1147

## 2012-02-26 ENCOUNTER — Emergency Department (HOSPITAL_COMMUNITY)
Admission: EM | Admit: 2012-02-26 | Discharge: 2012-02-26 | Disposition: A | Discharge status: Home / Self Care | Payer: Self-pay | Attending: Emergency Medicine | Admitting: Emergency Medicine

## 2012-02-26 ENCOUNTER — Emergency Department (HOSPITAL_COMMUNITY): Payer: Self-pay

## 2012-02-26 ENCOUNTER — Encounter (HOSPITAL_COMMUNITY): Payer: Self-pay | Admitting: *Deleted

## 2012-02-26 DIAGNOSIS — R112 Nausea with vomiting, unspecified: Secondary | ICD-10-CM | POA: Insufficient documentation

## 2012-02-26 DIAGNOSIS — Z8619 Personal history of other infectious and parasitic diseases: Secondary | ICD-10-CM | POA: Insufficient documentation

## 2012-02-26 DIAGNOSIS — R197 Diarrhea, unspecified: Secondary | ICD-10-CM | POA: Insufficient documentation

## 2012-02-26 DIAGNOSIS — R109 Unspecified abdominal pain: Secondary | ICD-10-CM | POA: Insufficient documentation

## 2012-02-26 DIAGNOSIS — R10819 Abdominal tenderness, unspecified site: Secondary | ICD-10-CM | POA: Insufficient documentation

## 2012-02-26 DIAGNOSIS — K921 Melena: Secondary | ICD-10-CM | POA: Insufficient documentation

## 2012-02-26 DIAGNOSIS — F17200 Nicotine dependence, unspecified, uncomplicated: Secondary | ICD-10-CM | POA: Insufficient documentation

## 2012-02-26 LAB — DIFFERENTIAL
Basophils Absolute: 0 10*3/uL (ref 0.0–0.1)
Eosinophils Relative: 3 % (ref 0–5)
Lymphocytes Relative: 24 % (ref 12–46)

## 2012-02-26 LAB — CBC
MCV: 84.3 fL (ref 78.0–100.0)
Platelets: 221 10*3/uL (ref 150–400)
RDW: 13.5 % (ref 11.5–15.5)
WBC: 7.7 10*3/uL (ref 4.0–10.5)

## 2012-02-26 LAB — URINALYSIS, ROUTINE W REFLEX MICROSCOPIC
Glucose, UA: NEGATIVE mg/dL
Hgb urine dipstick: NEGATIVE
Specific Gravity, Urine: 1.013 (ref 1.005–1.030)
pH: 8 (ref 5.0–8.0)

## 2012-02-26 LAB — COMPREHENSIVE METABOLIC PANEL
ALT: 155 U/L — ABNORMAL HIGH (ref 0–53)
AST: 70 U/L — ABNORMAL HIGH (ref 0–37)
Albumin: 3.9 g/dL (ref 3.5–5.2)
CO2: 29 mEq/L (ref 19–32)
Calcium: 9.6 mg/dL (ref 8.4–10.5)
GFR calc non Af Amer: >90 mL/min (ref >90)
Sodium: 137 mEq/L (ref 135–145)
Total Protein: 6.9 g/dL (ref 6.0–8.3)

## 2012-02-26 LAB — OCCULT BLOOD, POC DEVICE: Fecal Occult Bld: NEGATIVE

## 2012-02-26 MED ORDER — SODIUM CHLORIDE 0.9 % IV BOLUS (SEPSIS)
1000.0000 mL | Freq: Once | INTRAVENOUS | Status: AC
  Administered 2012-02-26: 1000 mL via INTRAVENOUS

## 2012-02-26 MED ORDER — PROMETHAZINE HCL 25 MG/ML IJ SOLN
12.5000 mg | Freq: Once | INTRAMUSCULAR | Status: AC
  Administered 2012-02-26: 12.5 mg via INTRAVENOUS
  Filled 2012-02-26: qty 1

## 2012-02-26 MED ORDER — HYDROMORPHONE HCL PF 1 MG/ML IJ SOLN
1.0000 mg | Freq: Once | INTRAMUSCULAR | Status: AC
  Administered 2012-02-26: 1 mg via INTRAVENOUS
  Filled 2012-02-26: qty 1

## 2012-02-26 MED ORDER — IOHEXOL 300 MG/ML  SOLN
80.0000 mL | Freq: Once | INTRAMUSCULAR | Status: AC | PRN
  Administered 2012-02-26: 80 mL via INTRAVENOUS

## 2012-02-26 MED ORDER — OXYCODONE-ACETAMINOPHEN 5-325 MG PO TABS: 1.0000 | ORAL_TABLET | ORAL | Status: AC | PRN

## 2012-02-26 MED ORDER — ONDANSETRON 8 MG PO TBDP
8.0000 mg | ORAL_TABLET | Freq: Once | ORAL | Status: AC
  Administered 2012-02-26: 8 mg via ORAL
  Filled 2012-02-26: qty 1

## 2012-02-26 MED ORDER — ONDANSETRON HCL 4 MG PO TABS: 4.0000 mg | ORAL_TABLET | Freq: Four times a day (QID) | ORAL | Status: AC

## 2012-02-26 NOTE — ED Notes (Signed)
Pt returned from CT °

## 2012-02-26 NOTE — ED Provider Notes (Signed)
History     CSN: [id number]  Arrival date & time 02/26/12  1150   First MD Initiated Contact with Patient 02/26/12 1321      Chief Complaint  Patient presents with  . Abdominal Pain    (Consider location/radiation/quality/duration/timing/severity/associated sxs/prior treatment) Patient is a 31 y.o. male presenting with abdominal pain. The history is provided by the patient. No language interpreter was used.  Abdominal Pain The primary symptoms of the illness include abdominal pain, nausea, vomiting, diarrhea and hematochezia. The primary symptoms of the illness do not include fever, fatigue, shortness of breath, hematemesis or dysuria. The current episode started yesterday. The problem has been gradually worsening.  The patient states that she believes she is currently not pregnant. The patient has not had a change in bowel habit. Additional symptoms associated with the illness include diaphoresis. Symptoms associated with the illness do not include chills, heartburn, constipation, hematuria, frequency or back pain. Significant associated medical issues include GERD, inflammatory bowel disease, substance abuse and diverticulitis. Significant associated medical issues do not include diabetes, sickle cell disease, gallstones, liver disease, HIV or cardiac disease.   Reports left lower quadrant pain with bloody diarrhea x2 days. Patient has a past medical history of diverticulitis and C. Difficile. Recent antibiotics for an abscess on his right forearm.Will check labs, CT abdomen and send for a c-diff.   Past Medical History  Diagnosis Date  . Hepatitis C   . Diverticulitis   . Colitis   . Drug abuse   . Anxiety   . Active smoker   . Depression   . MRSA (methicillin resistant staph aureus) culture positive   . 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 I&d extremity 11/10/2011    Procedure: IRRIGATION AND DEBRIDEMENT EXTREMITY;  Surgeon: Kathryne Hitch;  Location: [facility];  Service: Orthopedics;  Laterality: Right;  . I&d extremity 11/10/2011    Procedure: IRRIGATION AND DEBRIDEMENT EXTREMITY;  Surgeon: Kathryne Hitch;  Location: [facility];  Service: Orthopedics;  Laterality: Right;  with volar compartment release  . I&d extremity 12/24/2011    Procedure: IRRIGATION AND DEBRIDEMENT EXTREMITY;  Surgeon: Eldred Manges;  Location: [facility];  Service: Orthopedics;  Laterality: Right;  . Colonoscopy     History reviewed. No pertinent family history.  History  Substance Use Topics  . Smoking status: Current Everyday Smoker -- 1.0 packs/day for 18 years    Types: Cigarettes  . Smokeless tobacco: Never Used  . Alcohol Use: No      Review of Systems  Constitutional: Positive for diaphoresis. Negative for fever, chills and fatigue.  Respiratory: Negative for shortness of breath.   Gastrointestinal: Positive for nausea, vomiting, abdominal pain, diarrhea and hematochezia. Negative for heartburn, constipation and hematemesis.  Genitourinary: Negative for dysuria, frequency, hematuria, flank pain, discharge, scrotal swelling, enuresis, penile pain and testicular pain.  Musculoskeletal: Negative for back pain.  Neurological: Negative for light-headedness.    Allergies  Review of patient's allergies indicates no known allergies.  Home Medications   Current Outpatient Rx  Name Route Sig Dispense Refill  . METHADONE HCL 40 MG PO TBSO Oral Take 80 mg by mouth daily.      BP 149/96  Pulse 117  Temp(Src) 98.6 F (37 C) (Oral)  Resp 18  SpO2 100%  Physical Exam  Nursing note and vitals reviewed. Constitutional: He is oriented to person, place, and time. He appears well-developed and well-nourished. He appears distressed.  HENT:  Head: Normocephalic and atraumatic.  Eyes: Pupils are equal, round, and reactive to light.  Neck: Neck supple.  Cardiovascular: Normal rate and regular rhythm.  Exam reveals no gallop and no friction  rub.   No murmur heard. Pulmonary/Chest: Effort normal and breath sounds normal. No respiratory distress. He has no wheezes.  Abdominal: Soft. Bowel sounds are normal. He exhibits no distension. There is tenderness.  Musculoskeletal: Normal range of motion.  Neurological: He is alert and oriented to person, place, and time. No cranial nerve deficit.  Skin: Skin is warm. He is diaphoretic.  Psychiatric: He has a normal mood and affect.    ED Course  Procedures (including critical care time)  Labs Reviewed  COMPREHENSIVE METABOLIC PANEL - Abnormal; Notable for the following:    Glucose, Bld 120 (*)    AST 70 (*)    ALT 155 (*)    All other components within normal limits  CBC  DIFFERENTIAL  LIPASE, BLOOD  URINALYSIS, ROUTINE W REFLEX MICROSCOPIC   No results found.   No diagnosis found.    MDM  31 yo male with pmh of colitis coming in with LLQ abdominal pain and subjective bloody diarrhea.  hemacult - WBC 7.7 Elevated liver enzymes with hepatic cyst.  Denies etoh.  CT negative for any acute process.  Rehydration and antiemetics and pain meds in the ER. Feels better.  I feel he is safe to go home.  Will follow up with GI this week.     Labs Reviewed  COMPREHENSIVE METABOLIC PANEL - Abnormal; Notable for the following:    Glucose, Bld 120 (*)    AST 70 (*)    ALT 155 (*)    All other components within normal limits  CBC  DIFFERENTIAL  LIPASE, BLOOD  URINALYSIS, ROUTINE W REFLEX MICROSCOPIC  OCCULT BLOOD, POC DEVICE  LAB REPORT - SCANNED         Jethro Bastos, NP 02/27/12 1643

## 2012-02-26 NOTE — ED Notes (Signed)
Pt in c/o abd pain, n/v/d with blood noted to stool

## 2012-02-26 NOTE — ED Notes (Addendum)
Patient transported to CT 

## 2012-02-26 NOTE — ED Notes (Signed)
CT called to find out status of pt's scan, pt is to be transported next.

## 2012-02-26 NOTE — ED Notes (Signed)
Pt still c/o nausea. Vomited x 1, NP made aware- new orders received.

## 2012-02-26 NOTE — Discharge Instructions (Signed)
Mr. [name] your CT of the abdomen shows no acute process that would cause she pain in your left lower quadrant. The nausea and vomiting has been controlled in the ER today. We gave you for 2 bags of fluid to rehydrate you.  Get a PCP from the splint to follow up with. Return to the ER for uncontrolled nausea vomiting diarrhea or high fever uncontrolled by Tylenol. I will give you a few Percocet for pain do not drive with this pain medication. The Zofran for nausea.Abdominal Pain During Pregnancy Belly (abdominal) pain is common during pregnancy. Most of the time, it is not a serious problem. Other times, it can be a sign that something is wrong with the pregnancy. Always tell your doctor if you have belly pain. HOME CARE For mild pain:  Do not have sex (intercourse) or put anything in your vagina until you feel better.   Rest until your pain stops. If your pain lasts longer than 1 hour, call your doctor.   Drink clear fluids if you feel sick to your stomach (nauseous).   Do not eat solid food until you feel better.   Only take medicine as told by your doctor.   Keep all doctor visits as told.  GET HELP RIGHT AWAY IF:   You are bleeding, leaking fluid, or pieces of tissue come out of your vagina.   You have more pain or cramping.   You keep throwing up (vomiting).   You have pain when you pee (urinate) or have blood in your pee.   You have a fever.   You do not feel your baby moving as much.   You feel very weak or feel like passing out.   You have trouble breathing, with or without belly pain.   You have a very bad headache and belly pain.   You have fluid leaking from your vagina and belly pain.   You keep having watery poop (diarrhea).   Your belly pain does not go away after resting, or the pain gets worse.  MAKE SURE YOU:   Understand these instructions.   Will watch your condition.   Will get help right away if you are not doing well or get worse.  Document  Released: 11/28/2009 Document Revised: 11/29/2011 Document Reviewed: 07/06/2011 [facility] Patient Information 2012 [place], [place].

## 2012-02-26 NOTE — ED Notes (Signed)
Pt given contrast to drink.

## 2012-02-29 NOTE — ED Provider Notes (Signed)
Medical screening examination/treatment/procedure(s) were performed by non-physician practitioner and as supervising physician I was immediately available for consultation/collaboration.   Gwyneth Sprout, MD 02/29/12 [phone]

## 2012-03-09 ENCOUNTER — Encounter (HOSPITAL_COMMUNITY): Payer: Self-pay | Admitting: *Deleted

## 2012-03-09 ENCOUNTER — Inpatient Hospital Stay (HOSPITAL_COMMUNITY)
Admission: EM | Admit: 2012-03-09 | Discharge: 2012-03-11 | DRG: 603 | Disposition: A | Discharge status: Home / Self Care | Payer: Self-pay | Attending: Internal Medicine | Admitting: Internal Medicine

## 2012-03-09 DIAGNOSIS — F1910 Other psychoactive substance abuse, uncomplicated: Secondary | ICD-10-CM | POA: Diagnosis present

## 2012-03-09 DIAGNOSIS — K5730 Diverticulosis of large intestine without perforation or abscess without bleeding: Secondary | ICD-10-CM | POA: Diagnosis present

## 2012-03-09 DIAGNOSIS — F3289 Other specified depressive episodes: Secondary | ICD-10-CM | POA: Diagnosis present

## 2012-03-09 DIAGNOSIS — L0390 Cellulitis, unspecified: Secondary | ICD-10-CM

## 2012-03-09 DIAGNOSIS — F329 Major depressive disorder, single episode, unspecified: Secondary | ICD-10-CM | POA: Diagnosis present

## 2012-03-09 DIAGNOSIS — F17200 Nicotine dependence, unspecified, uncomplicated: Secondary | ICD-10-CM | POA: Diagnosis present

## 2012-03-09 DIAGNOSIS — L03115 Cellulitis of right lower limb: Secondary | ICD-10-CM | POA: Diagnosis present

## 2012-03-09 DIAGNOSIS — S81809A Unspecified open wound, unspecified lower leg, initial encounter: Secondary | ICD-10-CM | POA: Diagnosis present

## 2012-03-09 DIAGNOSIS — F411 Generalized anxiety disorder: Secondary | ICD-10-CM | POA: Diagnosis present

## 2012-03-09 DIAGNOSIS — F1120 Opioid dependence, uncomplicated: Secondary | ICD-10-CM | POA: Diagnosis present

## 2012-03-09 DIAGNOSIS — X58XXXA Exposure to other specified factors, initial encounter: Secondary | ICD-10-CM | POA: Diagnosis present

## 2012-03-09 DIAGNOSIS — L02419 Cutaneous abscess of limb, unspecified: Principal | ICD-10-CM | POA: Diagnosis present

## 2012-03-09 DIAGNOSIS — Z22322 Carrier or suspected carrier of Methicillin resistant Staphylococcus aureus: Secondary | ICD-10-CM

## 2012-03-09 DIAGNOSIS — L02413 Cutaneous abscess of right upper limb: Secondary | ICD-10-CM | POA: Diagnosis not present

## 2012-03-09 DIAGNOSIS — S81009A Unspecified open wound, unspecified knee, initial encounter: Secondary | ICD-10-CM | POA: Diagnosis present

## 2012-03-09 DIAGNOSIS — B1920 Unspecified viral hepatitis C without hepatic coma: Secondary | ICD-10-CM | POA: Diagnosis present

## 2012-03-09 LAB — CBC
HCT: 38.6 % — ABNORMAL LOW (ref 39.0–52.0)
Hemoglobin: 13.2 g/dL (ref 13.0–17.0)
MCV: 84.8 fL (ref 78.0–100.0)
RBC: 4.55 MIL/uL (ref 4.22–5.81)
RDW: 14.1 % (ref 11.5–15.5)
WBC: 10.9 10*3/uL — ABNORMAL HIGH (ref 4.0–10.5)

## 2012-03-09 LAB — BASIC METABOLIC PANEL
BUN: 10 mg/dL (ref 6–23)
CO2: 28 mEq/L (ref 19–32)
Chloride: 99 mEq/L (ref 96–112)
Creatinine, Ser: 0.6 mg/dL (ref 0.50–1.35)
Glucose, Bld: 132 mg/dL — ABNORMAL HIGH (ref 70–99)

## 2012-03-09 MED ORDER — VANCOMYCIN HCL IN DEXTROSE 1-5 GM/200ML-% IV SOLN
1000.0000 mg | Freq: Once | INTRAVENOUS | Status: AC
  Administered 2012-03-09: 1000 mg via INTRAVENOUS
  Filled 2012-03-09: qty 200

## 2012-03-09 MED ORDER — SULFAMETHOXAZOLE-TMP DS 800-160 MG PO TABS
1.0000 | ORAL_TABLET | Freq: Once | ORAL | Status: AC
  Administered 2012-03-09: 1 via ORAL
  Filled 2012-03-09: qty 1

## 2012-03-09 MED ORDER — HYDROMORPHONE HCL PF 1 MG/ML IJ SOLN
1.0000 mg | Freq: Once | INTRAMUSCULAR | Status: AC
  Administered 2012-03-09: 1 mg via INTRAVENOUS
  Filled 2012-03-09: qty 1

## 2012-03-09 MED ORDER — VANCOMYCIN HCL IN DEXTROSE 1-5 GM/200ML-% IV SOLN
1000.0000 mg | Freq: Two times a day (BID) | INTRAVENOUS | Status: DC
  Filled 2012-03-09: qty 200

## 2012-03-09 MED ORDER — NICOTINE 21 MG/24HR TD PT24
21.0000 mg | MEDICATED_PATCH | Freq: Once | TRANSDERMAL | Status: AC
  Administered 2012-03-09: 21 mg via TRANSDERMAL
  Filled 2012-03-09: qty 1

## 2012-03-09 MED ORDER — PIPERACILLIN-TAZOBACTAM 3.375 G IVPB
3.3750 g | Freq: Once | INTRAVENOUS | Status: AC
  Administered 2012-03-09: 3.375 g via INTRAVENOUS
  Filled 2012-03-09: qty 50

## 2012-03-09 MED ORDER — MORPHINE SULFATE 4 MG/ML IJ SOLN
4.0000 mg | Freq: Once | INTRAMUSCULAR | Status: AC
  Administered 2012-03-09: 4 mg via INTRAVENOUS
  Filled 2012-03-09: qty 1

## 2012-03-09 MED ORDER — SODIUM CHLORIDE 0.9 % IV SOLN: 20.0000 mL | INTRAVENOUS | Status: DC

## 2012-03-09 NOTE — ED Provider Notes (Signed)
4:45 PM I assumed care of the patient in the CDU, on cellulitis protocol, from Dr. Judd Lien. Pt with hx MRSA infx presented with pain, swelling, redness to RLE. States he noted a scratch on his leg which he scratched at yesterday, and noted increased redness and tenderness the area this morning, which has seemed to worsen today. He was moved to the CDU for IV antibiotics/cellulitis protocol. Patient currently in no acute distress with redness to the right lower extremity. I outlined the area with surgical marker at approximately 1645. We'll continue to monitor for spread. Standing orders for vancomycin placed. Dr. Judd Lien feels that as long as he is improving with 2-3 doses of IV vanc, he is appropriate for discharge home with PO Bactrim.  6:15 PM Pt in NAD, requesting additional pain meds. Re-dose ordered.  10:15 PM Pt again requesting additional pain meds. Area of cellulitis reassessed; does not appear to have spread beyond borders. Resting comfortably.  11:58 PM Signout given to Dr. Read Drivers. Pt due for second dose of IV vanc at about 0230.  Grant Fontana, [place] 03/10/12 0002

## 2012-03-09 NOTE — ED Notes (Signed)
Pt.stated, I've gotten bit or something on my leg and it really hurts bad.  Leg is red and warm to touch

## 2012-03-09 NOTE — ED Notes (Signed)
Pt is complaining that the area of redness and swelling has increased on leg from his shin down to his ankle and up to his knee. Talking with Grant Fontana PA and she went into mark the area.

## 2012-03-09 NOTE — ED Notes (Signed)
Pt has past HX of mrsa. And has been treated for skin infections in the past.

## 2012-03-09 NOTE — ED Provider Notes (Signed)
History     CSN: [id number]  Arrival date & time 03/09/12  1257   First MD Initiated Contact with Patient 03/09/12 1400      Chief Complaint  Patient presents with  . Leg Pain    (Consider location/radiation/quality/duration/timing/severity/associated sxs/prior treatment) HPI Comments: Has a history of recurrent mrsa infections.  Patient is a 31 y.o. male presenting with leg pain. The history is provided by the patient.  Leg Pain  The incident occurred 2 days ago. The incident occurred at home. Injury mechanism: a scratch on leg, he picked at it, now red and inflamed. The pain is present in the right leg. The quality of the pain is described as burning and sharp. The pain is moderate. The pain has been worsening since onset.    Past Medical History  Diagnosis Date  . Hepatitis C   . Diverticulitis   . Colitis   . Drug abuse   . Anxiety   . Active smoker   . Depression   . MRSA (methicillin resistant staph aureus) culture positive   . 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 I&d extremity 11/10/2011    Procedure: IRRIGATION AND DEBRIDEMENT EXTREMITY;  Surgeon: Kathryne Hitch;  Location: [facility];  Service: Orthopedics;  Laterality: Right;  . I&d extremity 11/10/2011    Procedure: IRRIGATION AND DEBRIDEMENT EXTREMITY;  Surgeon: Kathryne Hitch;  Location: [facility];  Service: Orthopedics;  Laterality: Right;  with volar compartment release  . I&d extremity 12/24/2011    Procedure: IRRIGATION AND DEBRIDEMENT EXTREMITY;  Surgeon: Eldred Manges;  Location: [facility];  Service: Orthopedics;  Laterality: Right;  . Colonoscopy     No family history on file.  History  Substance Use Topics  . Smoking status: Current Everyday Smoker -- 1.0 packs/day for 18 years    Types: Cigarettes  . Smokeless tobacco: Never Used  . Alcohol Use: No      Review of Systems  All other systems reviewed and are negative.    Allergies  Review of  patient's allergies indicates no known allergies.  Home Medications   Current Outpatient Rx  Name Route Sig Dispense Refill  . METHADONE HCL PO Oral Take 1 tablet by mouth daily.      BP 112/94  Pulse 97  Temp(Src) 98.1 F (36.7 C) (Oral)  Resp 20  SpO2 100%  Physical Exam  Vitals reviewed. Constitutional: He is oriented to person, place, and time. He appears well-developed and well-nourished.  HENT:  Head: Normocephalic and atraumatic.  Neck: Normal range of motion. Neck supple.  Musculoskeletal:       The rle has an area of erythema and warmth at the mid-shin.  There is a dark center.  No pus or fluctuance.  Neurological: He is alert and oriented to person, place, and time.  Skin: Skin is warm and dry.    ED Course  Procedures (including critical care time)   Labs Reviewed  CBC  BASIC METABOLIC PANEL   No results found.   No diagnosis found.    MDM  The patient has what appears to be a recurrent mrsa cellulitis.  He was given iv vancomycin and will be placed in the CDU under the cellulitis protocol.          Geoffery Lyons, MD 03/11/12 1012

## 2012-03-10 DIAGNOSIS — L03115 Cellulitis of right lower limb: Secondary | ICD-10-CM | POA: Diagnosis present

## 2012-03-10 LAB — CBC
MCH: 28.9 pg (ref 26.0–34.0)
MCHC: 33.9 g/dL (ref 30.0–36.0)
MCV: 85.4 fL (ref 78.0–100.0)
Platelets: 205 10*3/uL (ref 150–400)
RDW: 14.3 % (ref 11.5–15.5)

## 2012-03-10 MED ORDER — ONDANSETRON HCL 4 MG PO TABS: 4.0000 mg | ORAL_TABLET | Freq: Four times a day (QID) | ORAL | Status: DC | PRN

## 2012-03-10 MED ORDER — ENOXAPARIN SODIUM 40 MG/0.4ML ~~LOC~~ SOLN
40.0000 mg | SUBCUTANEOUS | Status: DC
  Administered 2012-03-10: 40 mg via SUBCUTANEOUS
  Filled 2012-03-10 (×2): qty 0.4

## 2012-03-10 MED ORDER — HYDROMORPHONE HCL PF 1 MG/ML IJ SOLN
1.0000 mg | INTRAMUSCULAR | Status: DC | PRN
  Administered 2012-03-10 (×2): 1 mg via INTRAVENOUS
  Filled 2012-03-10 (×2): qty 1

## 2012-03-10 MED ORDER — VANCOMYCIN HCL IN DEXTROSE 1-5 GM/200ML-% IV SOLN
INTRAVENOUS | Status: AC
  Filled 2012-03-10: qty 200

## 2012-03-10 MED ORDER — ONDANSETRON HCL 4 MG/2ML IJ SOLN: 4.0000 mg | Freq: Three times a day (TID) | INTRAMUSCULAR | Status: DC | PRN

## 2012-03-10 MED ORDER — VANCOMYCIN HCL IN DEXTROSE 1-5 GM/200ML-% IV SOLN
1000.0000 mg | Freq: Once | INTRAVENOUS | Status: DC
  Filled 2012-03-10: qty 200

## 2012-03-10 MED ORDER — VANCOMYCIN HCL IN DEXTROSE 1-5 GM/200ML-% IV SOLN
1000.0000 mg | Freq: Once | INTRAVENOUS | Status: AC
  Administered 2012-03-10: 1000 mg via INTRAVENOUS

## 2012-03-10 MED ORDER — NICOTINE 14 MG/24HR TD PT24
14.0000 mg | MEDICATED_PATCH | Freq: Every day | TRANSDERMAL | Status: DC
  Administered 2012-03-10: 14 mg via TRANSDERMAL
  Filled 2012-03-10 (×2): qty 1

## 2012-03-10 MED ORDER — ONDANSETRON HCL 4 MG/2ML IJ SOLN: 4.0000 mg | Freq: Four times a day (QID) | INTRAMUSCULAR | Status: DC | PRN

## 2012-03-10 MED ORDER — SODIUM CHLORIDE 0.9 % IV SOLN
INTRAVENOUS | Status: AC
  Administered 2012-03-10: 16:00:00 via INTRAVENOUS

## 2012-03-10 MED ORDER — METHADONE HCL 5 MG PO TABS
120.0000 mg | ORAL_TABLET | Freq: Once | ORAL | Status: AC
  Administered 2012-03-10: 120 mg via ORAL
  Filled 2012-03-10: qty 24

## 2012-03-10 MED ORDER — ACETAMINOPHEN 650 MG RE SUPP: 650.0000 mg | Freq: Four times a day (QID) | RECTAL | Status: DC | PRN

## 2012-03-10 MED ORDER — PIPERACILLIN-TAZOBACTAM 3.375 G IVPB: 3.3750 g | Freq: Four times a day (QID) | INTRAVENOUS | Status: DC

## 2012-03-10 MED ORDER — PIPERACILLIN-TAZOBACTAM 3.375 G IVPB
3.3750 g | Freq: Three times a day (TID) | INTRAVENOUS | Status: DC
  Administered 2012-03-10: 3.375 g via INTRAVENOUS
  Filled 2012-03-10 (×3): qty 50

## 2012-03-10 MED ORDER — VANCOMYCIN HCL 1000 MG IV SOLR
1500.0000 mg | Freq: Two times a day (BID) | INTRAVENOUS | Status: DC
  Administered 2012-03-10 – 2012-03-11 (×2): 1500 mg via INTRAVENOUS
  Filled 2012-03-10 (×4): qty 1500

## 2012-03-10 MED ORDER — OXYCODONE HCL 5 MG PO TABS
2.5000 mg | ORAL_TABLET | Freq: Once | ORAL | Status: AC
  Administered 2012-03-10: 2.5 mg via ORAL
  Filled 2012-03-10: qty 1

## 2012-03-10 MED ORDER — SODIUM CHLORIDE 0.9 % IV SOLN
INTRAVENOUS | Status: DC
  Administered 2012-03-10 – 2012-03-11 (×2): via INTRAVENOUS

## 2012-03-10 MED ORDER — HYDROMORPHONE HCL PF 1 MG/ML IJ SOLN
1.0000 mg | Freq: Once | INTRAMUSCULAR | Status: AC
  Administered 2012-03-10: 1 mg via INTRAVENOUS
  Filled 2012-03-10: qty 1

## 2012-03-10 MED ORDER — OXYCODONE HCL 5 MG PO TABS
5.0000 mg | ORAL_TABLET | ORAL | Status: DC | PRN
  Administered 2012-03-10 – 2012-03-11 (×4): 5 mg via ORAL
  Filled 2012-03-10 (×4): qty 1

## 2012-03-10 MED ORDER — METHADONE HCL 40 MG PO TBSO: 120.0000 mg | ORAL_TABLET | Freq: Every day | ORAL | Status: DC

## 2012-03-10 MED ORDER — ACETAMINOPHEN 325 MG PO TABS
650.0000 mg | ORAL_TABLET | Freq: Four times a day (QID) | ORAL | Status: DC | PRN
  Administered 2012-03-10 – 2012-03-11 (×4): 650 mg via ORAL
  Filled 2012-03-10 (×4): qty 2

## 2012-03-10 NOTE — ED Provider Notes (Signed)
I was available during this portion of the patient's ED course for any / all consultation with the care provider.  Gerhard Munch, MD 03/10/12 [phone]

## 2012-03-10 NOTE — Progress Notes (Signed)
Observation review is complete. 

## 2012-03-10 NOTE — ED Notes (Signed)
Noted reduced redness within marked area.

## 2012-03-10 NOTE — H&P (Signed)
PCP:   No primary provider on file.   Chief Complaint:  Redness, pain of right leg.  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 Today it became very painful to where he had difficulty walking and very red so he came to the ED. In 12/12 he had an MRSA forearm abscess. He was on the CDU protocol and received 2 doses of vanc but because he failed to improve we are asked to see him for further evaluation and management.  Allergies:  No Known Allergies    Past Medical History  Diagnosis Date  . Hepatitis C   . Diverticulitis   . Colitis   . Drug abuse   . Anxiety   . Active smoker   . Depression   . MRSA (methicillin resistant staph aureus) culture positive   . 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 I&d extremity 11/10/2011    Procedure: IRRIGATION AND DEBRIDEMENT EXTREMITY;  Surgeon: Kathryne Hitch;  Location: [facility] OR;  Service: Orthopedics;  Laterality: Right;  . I&d extremity 11/10/2011    Procedure: IRRIGATION AND DEBRIDEMENT EXTREMITY;  Surgeon: Kathryne Hitch;  Location: [facility] OR;  Service: Orthopedics;  Laterality: Right;  with volar compartment release  . I&d extremity 12/24/2011    Procedure: IRRIGATION AND DEBRIDEMENT EXTREMITY;  Surgeon: Eldred Manges;  Location: [facility] OR;  Service: Orthopedics;  Laterality: Right;  . Colonoscopy     Prior to Admission medications   Medication Sig Start Date End Date Taking? Authorizing Provider  METHADONE HCL PO Take 1 tablet by mouth daily.   Yes Historical Provider, MD    Social History:  reports that he has been smoking Cigarettes.  He has a 18 pack-year smoking history. He has never used smokeless tobacco. He reports that he does not drink alcohol or use illicit drugs.  No family history on file.  Review of Systems:    Negative except as mentioned in HPI.   Physical Exam: Blood pressure 133/80, pulse 72, temperature 98.2 F (36.8 C), temperature source Oral, resp. rate 18, height 5\' 11"  (1.803 m), weight 79.379 kg (175 lb), SpO2 97.00%. General: AAOx3 HEENT: [place]/AT, PERLA, EOMI, moist mucous membranes  Neck: no JVD, no lymphadenopathy, no bruits no goiter. CV: RRR, no M/R/G Lungs: CTA B Abdomen: S/NT/ND/positive BS Ext: Left no C/C/E, right positive pulses, 2 cm scab to pretibial area with surrounding edema and erythema, already much improved from margins demarcated earlier in the ED. Neuro: grossly intact and nonfocal.  Labs on Admission:  Results for orders placed during the hospital encounter of 03/09/12 (from the past 48 hour(s))  CBC     Status: Abnormal   Collection Time   03/09/12  2:06 PM      Component Value Range Comment   WBC 10.9 (*) 4.0 - 10.5 (K/uL)    RBC 4.55  4.22 - 5.81 (MIL/uL)    Hemoglobin 13.2  13.0 - 17.0 (g/dL)    HCT 08.6 (*) 57.8 - 52.0 (%)    MCV 84.8  78.0 - 100.0 (fL)    MCH 29.0  26.0 - 34.0 (pg)    MCHC 34.2  30.0 - 36.0 (g/dL)    RDW 46.9  62.9 - 52.8 (%)    Platelets 221  150 - 400 (  K/uL)   BASIC METABOLIC PANEL     Status: Abnormal   Collection Time   03/09/12  2:06 PM      Component Value Range Comment   Sodium 136  135 - 145 (mEq/L)    Potassium 4.0  3.5 - 5.1 (mEq/L)    Chloride 99  96 - 112 (mEq/L)    CO2 28  19 - 32 (mEq/L)    Glucose, Bld 132 (*) 70 - 99 (mg/dL)    BUN 10  6 - 23 (mg/dL)    Creatinine, Ser 1.61  0.50 - 1.35 (mg/dL)    Calcium 9.4  8.4 - 10.5 (mg/dL)    GFR calc non Af Amer >90  >90 (mL/min)    GFR calc Af Amer >90  >90 (mL/min)     Radiological Exams on Admission: No results found.  Assessment/Plan Principal Problem:  *Cellulitis of leg without foot, right Active Problems:  Hepatitis C  Drug abuse  Abscess of forearm, right   #1 Cellulitis Right Lower Extremity: already much improved. No fever or leukocytosis.. Cont  vanc for tonight. Will likely be able to DC home in am with PO doxy or clinda.  #2 DVT Prophylaxis: lovenox.  Time Spent on Admission: 45 minutes  HERNANDEZ ACOSTA,Essense Bousquet [facility] Pager: [phone] 03/10/2012, 6:18 PM

## 2012-03-10 NOTE — ED Notes (Signed)
ATTEMPTED TO CALL REPORT. NURSE NOT AVAILABLE 

## 2012-03-10 NOTE — Progress Notes (Signed)
ANTIBIOTIC CONSULT NOTE - INITIAL  Pharmacy Consult for Vancomycin Indication: ? MRSA cellulitis  No Known Allergies  Patient Measurements: Height: 5\' 11"  (180.3 cm) Weight: 175 lb (79.379 kg) IBW/kg (Calculated) : 75.3 kg  Vital Signs: Temp: 98.2 F (36.8 C) (03/18 1430) Temp src: Oral (03/18 1430) BP: 133/80 mmHg (03/18 1430) Pulse Rate: 72  (03/18 1430) Intake/Output from previous day: 03/17 0701 - 03/18 0700 In: -  Out: 900 [Urine:900] Intake/Output from this shift:    Labs:  Basename 03/09/12 1406  WBC 10.9*  HGB 13.2  PLT 221  LABCREA --  CREATININE 0.60   Estimated Creatinine Clearance: 141.2 ml/min (by C-G formula based on Cr of 0.6). No results found for this basename: VANCOTROUGH:2,VANCOPEAK:2,VANCORANDOM:2,GENTTROUGH:2,GENTPEAK:2,GENTRANDOM:2,TOBRATROUGH:2,TOBRAPEAK:2,TOBRARND:2,AMIKACINPEAK:2,AMIKACINTROU:2,AMIKACIN:2, in the last 72 hours   Microbiology: No results found for this or any previous visit (from the past 720 hour(s)).  Medical History: Past Medical History  Diagnosis Date  . Hepatitis C   . Diverticulitis   . Colitis   . Drug abuse   . Anxiety   . Active smoker   . Depression   . MRSA (methicillin resistant staph aureus) culture positive   . 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 METHADONE HCL PO Take 1 tablet by mouth daily.       Assessment: 31 year old admitted with ? MRSA cellulitis Received vancomycin dose at 7am CrCl stable  Goal of Therapy:  Vancomycin trough level = 10 to 15 mcg / dL  Plan:  1) Vancomycin 7829 mg IV q 12 hours 2) Follow up plan, cultures, Scr  Thank you.  Elwin Sleight 03/10/2012,6:23 PM

## 2012-03-10 NOTE — Progress Notes (Signed)
Observation review is complete. Stop Bill sent.

## 2012-03-10 NOTE — ED Provider Notes (Signed)
  Physical Exam  BP 129/71  Pulse 91  Temp(Src) 97.9 F (36.6 C) (Oral)  Resp 18  SpO2 90%  Physical Exam  ED Course  Procedures  MDM Patient seen after observation period. Cellulitis has increased and is now outside previously demarcated borders inferiorly. He continued to have pain and will be admitted.      Juliet Rude. Rubin Payor, MD 03/10/12 [phone]

## 2012-03-11 LAB — CBC
HCT: 36.7 % — ABNORMAL LOW (ref 39.0–52.0)
MCH: 28.5 pg (ref 26.0–34.0)
MCV: 85 fL (ref 78.0–100.0)
Platelets: 190 10*3/uL (ref 150–400)
RDW: 14.3 % (ref 11.5–15.5)

## 2012-03-11 LAB — BASIC METABOLIC PANEL
CO2: 26 mEq/L (ref 19–32)
Calcium: 9 mg/dL (ref 8.4–10.5)
Creatinine, Ser: 0.57 mg/dL (ref 0.50–1.35)
Glucose, Bld: 99 mg/dL (ref 70–99)

## 2012-03-11 MED ORDER — OXYCODONE HCL 5 MG PO TABS: 5.0000 mg | ORAL_TABLET | ORAL | Status: AC | PRN

## 2012-03-11 MED ORDER — DOXYCYCLINE HYCLATE 100 MG PO TABS: 100.0000 mg | ORAL_TABLET | Freq: Two times a day (BID) | ORAL | Status: AC

## 2012-03-11 MED ORDER — METHADONE HCL 10 MG PO TABS
120.0000 mg | ORAL_TABLET | Freq: Once | ORAL | Status: AC
  Administered 2012-03-11: 120 mg via ORAL
  Filled 2012-03-11: qty 12

## 2012-03-11 NOTE — Progress Notes (Signed)
Clinical Social Worker received notification from [facility] that pt was need of bus pass, RN confirmed.  CSW provided bus pass to RN.  CSW signging off at this time.  Angelia Mould, MSW, [place] [phone]

## 2012-03-11 NOTE — Progress Notes (Signed)
Utilization review completed.  

## 2012-03-11 NOTE — Discharge Instructions (Signed)
Cellulitis Cellulitis is an infection of the tissue under the skin. The infected area is usually red and tender. This is caused by germs. These germs enter the body through cuts or sores. This usually happens in the arms or lower legs. HOME CARE   Take your medicine as told. Finish it even if you start to feel better.   If the infection is on the arm or leg, keep it raised (elevated).   Use a warm cloth on the infected area several times a day.   See your doctor for a follow-up visit as told.  GET HELP RIGHT AWAY IF:   You are tired or confused.   You throw up (vomit).   You have watery poop (diarrhea).   You feel ill and have muscle aches.   You have a fever.  MAKE SURE YOU:   Understand these instructions.   Will watch your condition.   Will get help right away if you are not doing well or get worse.  Document Released: 05/28/2008 Document Revised: 11/29/2011 Document Reviewed: 11/11/2009 Exit[employer] Patient Information 2012 [employer]. 

## 2012-03-11 NOTE — Discharge Summary (Signed)
Physician Discharge Summary  Patient ID: [name] MRN: [id number] DOB/AGE: August 04, 1981 31 y.o.  Admit date: 03/09/2012 Discharge date: 03/11/2012  Primary Care Physician:  No primary provider on file.   Discharge Diagnoses:    Principal Problem:  *Cellulitis of leg without foot, right Active Problems:  Hepatitis C  Drug abuse  Abscess of forearm, right    Medication List  As of 03/11/2012 12:35 PM   TAKE these medications         doxycycline 100 MG tablet   Commonly known as: VIBRA-TABS   Take 1 tablet (100 mg total) by mouth 2 (two) times daily.      METHADONE HCL PO   Take 1 tablet by mouth daily.      oxyCODONE 5 MG immediate release tablet   Commonly known as: Oxy IR/ROXICODONE   Take 1 tablet (5 mg total) by mouth every 4 (four) hours as needed.             Disposition and Follow-up:  Will be discharged home today in stable and improved condition.   Consults:  None    Significant Diagnostic Studies:  No results found.  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 On the day of admission it became very painful to where he had difficulty walking and very red so he came to the ED. In 12/12 he had an MRSA forearm abscess. He was on the CDU protocol and received 2 doses of vanc but because he failed to improve we are asked to see him for further evaluation and management.     Hospital Course:  Principal Problem:  *Cellulitis of leg without foot, right Active Problems:  Hepatitis C  Drug abuse  Abscess of forearm, right   #1 Right Lower Extremity Cellulitis: Dramatically improved on IV vanc. Has h/o MRSA abscess in the forearm. Will be discharged on a 10 day course of doxycycline. Is instructed to return  if not improved in 2 weeks.  Time spent on Discharge: Greater than 30 minutes  Signed: Chaya Jan [facility] Pager: [phone] 03/11/2012, 12:35 PM

## 2012-03-12 NOTE — Progress Notes (Signed)
   CARE MANAGEMENT NOTE 03/12/2012  Patient:  [name], [name]   Account Number:  [id number]  Date Initiated:  03/12/2012  Documentation initiated by:  Letha Cape  Subjective/Objective Assessment:   dx cellulitis of leg  admit     Action/Plan:   Anticipated DC Date:  03/12/2012   Anticipated DC Plan:  HOME/SELF CARE      DC Planning Services  CM consult      Choice offered to / List presented to:             Status of service:  Completed, signed off Medicare Important Message given?   (If response is "NO", the following Medicare IM given date fields will be blank) Date Medicare IM given:   Date Additional Medicare IM given:    Discharge Disposition:  HOME/SELF CARE  Per UR Regulation:    If discussed at Long Length of Stay Meetings, dates discussed:    Comments:  03/12/12 13:50 Letha Cape RN, BSN [phone] patient independent pta, patient needs crutches, physical therapist was able to get those for him.  Patient was given a bus pass by CSW , filled abx for patient for med ast . Patient dc to home.

## 2015-01-06 ENCOUNTER — Encounter (HOSPITAL_COMMUNITY): Payer: Self-pay | Admitting: Orthopaedic Surgery

## 2021-12-24 DIAGNOSIS — S049XXA Injury of unspecified cranial nerve, initial encounter: Secondary | ICD-10-CM

## 2021-12-24 DIAGNOSIS — J391 Other abscess of pharynx: Secondary | ICD-10-CM

## 2021-12-24 HISTORY — DX: Injury of unspecified cranial nerve, initial encounter: S04.9XXA

## 2021-12-24 HISTORY — DX: Other abscess of pharynx: J39.1

## 2022-06-07 ENCOUNTER — Encounter (HOSPITAL_COMMUNITY): Payer: Self-pay

## 2022-06-07 ENCOUNTER — Emergency Department: Payer: Self-pay

## 2022-06-07 ENCOUNTER — Inpatient Hospital Stay (HOSPITAL_COMMUNITY)
Admission: EM | Admit: 2022-06-07 | Discharge: 2022-06-20 | DRG: 208 | Disposition: A | Discharge status: Home / Self Care | Payer: Self-pay | Attending: Pulmonary Disease | Admitting: Pulmonary Disease

## 2022-06-07 ENCOUNTER — Emergency Department (HOSPITAL_COMMUNITY): Payer: Self-pay

## 2022-06-07 DIAGNOSIS — F17210 Nicotine dependence, cigarettes, uncomplicated: Secondary | ICD-10-CM | POA: Diagnosis present

## 2022-06-07 DIAGNOSIS — I959 Hypotension, unspecified: Secondary | ICD-10-CM | POA: Diagnosis not present

## 2022-06-07 DIAGNOSIS — L03115 Cellulitis of right lower limb: Secondary | ICD-10-CM

## 2022-06-07 DIAGNOSIS — F17290 Nicotine dependence, other tobacco product, uncomplicated: Secondary | ICD-10-CM | POA: Diagnosis present

## 2022-06-07 DIAGNOSIS — Z79899 Other long term (current) drug therapy: Secondary | ICD-10-CM

## 2022-06-07 DIAGNOSIS — B3789 Other sites of candidiasis: Secondary | ICD-10-CM | POA: Diagnosis present

## 2022-06-07 DIAGNOSIS — G521 Disorders of glossopharyngeal nerve: Secondary | ICD-10-CM | POA: Diagnosis present

## 2022-06-07 DIAGNOSIS — Z635 Disruption of family by separation and divorce: Secondary | ICD-10-CM

## 2022-06-07 DIAGNOSIS — R451 Restlessness and agitation: Secondary | ICD-10-CM

## 2022-06-07 DIAGNOSIS — G928 Other toxic encephalopathy: Secondary | ICD-10-CM | POA: Diagnosis present

## 2022-06-07 DIAGNOSIS — Z8701 Personal history of pneumonia (recurrent): Secondary | ICD-10-CM

## 2022-06-07 DIAGNOSIS — R64 Cachexia: Secondary | ICD-10-CM | POA: Diagnosis present

## 2022-06-07 DIAGNOSIS — R490 Dysphonia: Secondary | ICD-10-CM | POA: Diagnosis present

## 2022-06-07 DIAGNOSIS — J690 Pneumonitis due to inhalation of food and vomit: Principal | ICD-10-CM

## 2022-06-07 DIAGNOSIS — J9622 Acute and chronic respiratory failure with hypercapnia: Secondary | ICD-10-CM | POA: Diagnosis present

## 2022-06-07 DIAGNOSIS — T40491A Poisoning by other synthetic narcotics, accidental (unintentional), initial encounter: Secondary | ICD-10-CM | POA: Diagnosis present

## 2022-06-07 DIAGNOSIS — Z221 Carrier of other intestinal infectious diseases: Secondary | ICD-10-CM

## 2022-06-07 DIAGNOSIS — G522 Disorders of vagus nerve: Secondary | ICD-10-CM | POA: Diagnosis present

## 2022-06-07 DIAGNOSIS — J9621 Acute and chronic respiratory failure with hypoxia: Secondary | ICD-10-CM

## 2022-06-07 DIAGNOSIS — R1310 Dysphagia, unspecified: Secondary | ICD-10-CM

## 2022-06-07 DIAGNOSIS — Z8619 Personal history of other infectious and parasitic diseases: Secondary | ICD-10-CM

## 2022-06-07 DIAGNOSIS — T43651A Poisoning by methamphetamines accidental (unintentional), initial encounter: Secondary | ICD-10-CM | POA: Diagnosis not present

## 2022-06-07 DIAGNOSIS — B370 Candidal stomatitis: Secondary | ICD-10-CM | POA: Diagnosis present

## 2022-06-07 DIAGNOSIS — R1313 Dysphagia, pharyngeal phase: Secondary | ICD-10-CM | POA: Diagnosis present

## 2022-06-07 DIAGNOSIS — E43 Unspecified severe protein-calorie malnutrition: Secondary | ICD-10-CM | POA: Diagnosis present

## 2022-06-07 DIAGNOSIS — Z66 Do not resuscitate: Secondary | ICD-10-CM | POA: Diagnosis not present

## 2022-06-07 DIAGNOSIS — J189 Pneumonia, unspecified organism: Principal | ICD-10-CM

## 2022-06-07 DIAGNOSIS — F1510 Other stimulant abuse, uncomplicated: Secondary | ICD-10-CM | POA: Diagnosis present

## 2022-06-07 DIAGNOSIS — R627 Adult failure to thrive: Secondary | ICD-10-CM | POA: Diagnosis present

## 2022-06-07 DIAGNOSIS — Z681 Body mass index (BMI) 19 or less, adult: Secondary | ICD-10-CM

## 2022-06-07 DIAGNOSIS — R54 Age-related physical debility: Secondary | ICD-10-CM | POA: Diagnosis present

## 2022-06-07 DIAGNOSIS — Z8611 Personal history of tuberculosis: Secondary | ICD-10-CM

## 2022-06-07 DIAGNOSIS — F1120 Opioid dependence, uncomplicated: Secondary | ICD-10-CM | POA: Diagnosis present

## 2022-06-07 DIAGNOSIS — Z20822 Contact with and (suspected) exposure to covid-19: Secondary | ICD-10-CM | POA: Diagnosis present

## 2022-06-07 DIAGNOSIS — F419 Anxiety disorder, unspecified: Secondary | ICD-10-CM | POA: Diagnosis present

## 2022-06-07 DIAGNOSIS — F17200 Nicotine dependence, unspecified, uncomplicated: Secondary | ICD-10-CM | POA: Diagnosis present

## 2022-06-07 DIAGNOSIS — F32A Depression, unspecified: Secondary | ICD-10-CM | POA: Diagnosis present

## 2022-06-07 DIAGNOSIS — J9601 Acute respiratory failure with hypoxia: Secondary | ICD-10-CM

## 2022-06-07 DIAGNOSIS — Z8614 Personal history of Methicillin resistant Staphylococcus aureus infection: Secondary | ICD-10-CM

## 2022-06-07 DIAGNOSIS — T40601A Poisoning by unspecified narcotics, accidental (unintentional), initial encounter: Secondary | ICD-10-CM | POA: Diagnosis present

## 2022-06-07 LAB — CBC WITH DIFFERENTIAL/PLATELET
Abs Immature Granulocytes: 0.1 10*3/uL — ABNORMAL HIGH (ref 0.00–0.07)
Basophils Absolute: 0 10*3/uL (ref 0.0–0.1)
Basophils Relative: 0 %
Eosinophils Absolute: 0 10*3/uL (ref 0.0–0.5)
Eosinophils Relative: 0 %
HCT: 32.2 % — ABNORMAL LOW (ref 39.0–52.0)
Hemoglobin: 10.8 g/dL — ABNORMAL LOW (ref 13.0–17.0)
Immature Granulocytes: 1 %
Lymphocytes Relative: 5 %
Lymphs Abs: 0.8 10*3/uL (ref 0.7–4.0)
MCH: 26.8 pg (ref 26.0–34.0)
MCHC: 33.5 g/dL (ref 30.0–36.0)
MCV: 79.9 fL — ABNORMAL LOW (ref 80.0–100.0)
Monocytes Absolute: 0.6 10*3/uL (ref 0.1–1.0)
Monocytes Relative: 4 %
Neutro Abs: 14.7 10*3/uL — ABNORMAL HIGH (ref 1.7–7.7)
Neutrophils Relative %: 90 %
Platelets: 476 10*3/uL — ABNORMAL HIGH (ref 150–400)
RBC: 4.03 MIL/uL — ABNORMAL LOW (ref 4.22–5.81)
RDW: 13.5 % (ref 11.5–15.5)
WBC: 16.2 10*3/uL — ABNORMAL HIGH (ref 4.0–10.5)
nRBC: 0 % (ref 0.0–0.2)

## 2022-06-07 LAB — COMPREHENSIVE METABOLIC PANEL
ALT: 34 U/L (ref 0–44)
AST: 39 U/L (ref 15–41)
Albumin: 2.4 g/dL — ABNORMAL LOW (ref 3.5–5.0)
Alkaline Phosphatase: 116 U/L (ref 38–126)
Anion gap: 9 (ref 5–15)
BUN: 31 mg/dL — ABNORMAL HIGH (ref 6–20)
CO2: 28 mmol/L (ref 22–32)
Calcium: 8.6 mg/dL — ABNORMAL LOW (ref 8.9–10.3)
Chloride: 97 mmol/L — ABNORMAL LOW (ref 98–111)
Creatinine, Ser: 0.71 mg/dL (ref 0.61–1.24)
GFR, Estimated: >60 mL/min (ref >60)
Glucose, Bld: 82 mg/dL (ref 70–99)
Potassium: 4 mmol/L (ref 3.5–5.1)
Sodium: 134 mmol/L — ABNORMAL LOW (ref 135–145)
Total Bilirubin: 0.5 mg/dL (ref 0.3–1.2)
Total Protein: 6.4 g/dL — ABNORMAL LOW (ref 6.5–8.1)

## 2022-06-07 NOTE — ED Triage Notes (Signed)
Pt states that he was recently diagnosed with pneumonia, having SOB for the past week and a half, coughing up green sputum, pt is supposed to be on 3L all the time but does not have oxygen at home.

## 2022-06-07 NOTE — ED Provider Triage Note (Signed)
Emergency Medicine Provider Triage Evaluation Note  [name] , a 41 y.o. male  was evaluated in triage.  Pt complains of shortness of breath.  Pt reports he had pneumonia in April and has been short of breath since   Review of Systems  Positive: cough Negative:   Physical Exam  BP (!) 135/97   Pulse 80   Temp 98.3 F (36.8 C) (Oral)   Resp (!) 24   SpO2 98%  Gen:   Awake, no distress   Resp:  Normal effort  MSK:   Moves extremities without difficulty  Other:    Medical Decision Making  Medically screening exam initiated at 9:31 PM.  Appropriate orders placed.  [name] was informed that the remainder of the evaluation will be completed by another provider, this initial triage assessment does not replace that evaluation, and the importance of remaining in the ED until their evaluation is complete.     Elson Areas, [place] 06/07/22 2132

## 2022-06-08 ENCOUNTER — Other Ambulatory Visit: Payer: Self-pay

## 2022-06-08 ENCOUNTER — Encounter (HOSPITAL_COMMUNITY): Payer: Self-pay | Admitting: Emergency Medicine

## 2022-06-08 DIAGNOSIS — R1310 Dysphagia, unspecified: Secondary | ICD-10-CM

## 2022-06-08 DIAGNOSIS — F17200 Nicotine dependence, unspecified, uncomplicated: Secondary | ICD-10-CM | POA: Diagnosis present

## 2022-06-08 DIAGNOSIS — F1120 Opioid dependence, uncomplicated: Secondary | ICD-10-CM | POA: Diagnosis not present

## 2022-06-08 DIAGNOSIS — E43 Unspecified severe protein-calorie malnutrition: Secondary | ICD-10-CM | POA: Diagnosis present

## 2022-06-08 DIAGNOSIS — J690 Pneumonitis due to inhalation of food and vomit: Secondary | ICD-10-CM | POA: Diagnosis not present

## 2022-06-08 DIAGNOSIS — J189 Pneumonia, unspecified organism: Secondary | ICD-10-CM | POA: Diagnosis present

## 2022-06-08 LAB — BASIC METABOLIC PANEL
Anion gap: 9 (ref 5–15)
BUN: 30 mg/dL — ABNORMAL HIGH (ref 6–20)
CO2: 27 mmol/L (ref 22–32)
Calcium: 8.3 mg/dL — ABNORMAL LOW (ref 8.9–10.3)
Chloride: 98 mmol/L (ref 98–111)
Creatinine, Ser: 0.68 mg/dL (ref 0.61–1.24)
GFR, Estimated: >60 mL/min (ref >60)
Glucose, Bld: 77 mg/dL (ref 70–99)
Potassium: 5.2 mmol/L — ABNORMAL HIGH (ref 3.5–5.1)
Sodium: 134 mmol/L — ABNORMAL LOW (ref 135–145)

## 2022-06-08 LAB — LACTIC ACID, PLASMA: Lactic Acid, Venous: 0.6 mmol/L (ref 0.5–1.9)

## 2022-06-08 LAB — CBC
HCT: 31.6 % — ABNORMAL LOW (ref 39.0–52.0)
Hemoglobin: 10.4 g/dL — ABNORMAL LOW (ref 13.0–17.0)
MCH: 26.5 pg (ref 26.0–34.0)
MCHC: 32.9 g/dL (ref 30.0–36.0)
MCV: 80.4 fL (ref 80.0–100.0)
Platelets: 440 10*3/uL — ABNORMAL HIGH (ref 150–400)
RBC: 3.93 MIL/uL — ABNORMAL LOW (ref 4.22–5.81)
RDW: 13.6 % (ref 11.5–15.5)
WBC: 13.1 10*3/uL — ABNORMAL HIGH (ref 4.0–10.5)
nRBC: 0 % (ref 0.0–0.2)

## 2022-06-08 LAB — RAPID URINE DRUG SCREEN, HOSP PERFORMED
Amphetamines: POSITIVE — AB
Barbiturates: NOT DETECTED
Benzodiazepines: NOT DETECTED
Cocaine: NOT DETECTED
Opiates: NOT DETECTED
Tetrahydrocannabinol: POSITIVE — AB

## 2022-06-08 LAB — HIV ANTIBODY (ROUTINE TESTING W REFLEX): HIV Screen 4th Generation wRfx: NONREACTIVE

## 2022-06-08 LAB — RESP PANEL BY RT-PCR (FLU A&B, COVID) ARPGX2
Influenza A by PCR: NEGATIVE
Influenza B by PCR: NEGATIVE
SARS Coronavirus 2 by RT PCR: NEGATIVE

## 2022-06-08 LAB — MRSA NEXT GEN BY PCR, NASAL: MRSA by PCR Next Gen: NOT DETECTED

## 2022-06-08 LAB — PROCALCITONIN: Procalcitonin: 0.72 ng/mL

## 2022-06-08 MED ORDER — ONDANSETRON HCL 4 MG PO TABS: 4.0000 mg | ORAL_TABLET | Freq: Four times a day (QID) | ORAL | Status: DC | PRN

## 2022-06-08 MED ORDER — BUPRENORPHINE HCL-NALOXONE HCL 2-0.5 MG SL SUBL
1.0000 | SUBLINGUAL_TABLET | SUBLINGUAL | Status: AC | PRN
  Administered 2022-06-08 – 2022-06-09 (×2): 1 via SUBLINGUAL
  Filled 2022-06-08 (×2): qty 1

## 2022-06-08 MED ORDER — ACETAMINOPHEN 325 MG PO TABS
650.0000 mg | ORAL_TABLET | Freq: Four times a day (QID) | ORAL | Status: DC | PRN
  Administered 2022-06-08 – 2022-06-16 (×13): 650 mg via ORAL
  Filled 2022-06-08 (×13): qty 2

## 2022-06-08 MED ORDER — SODIUM CHLORIDE 0.9 % IV SOLN
2.0000 g | Freq: Three times a day (TID) | INTRAVENOUS | Status: DC
  Administered 2022-06-08 – 2022-06-11 (×8): 2 g via INTRAVENOUS
  Filled 2022-06-08 (×8): qty 12.5

## 2022-06-08 MED ORDER — DOCUSATE SODIUM 100 MG PO CAPS
100.0000 mg | ORAL_CAPSULE | Freq: Two times a day (BID) | ORAL | Status: DC
  Administered 2022-06-08 – 2022-06-16 (×11): 100 mg via ORAL
  Filled 2022-06-08 (×15): qty 1

## 2022-06-08 MED ORDER — ENOXAPARIN SODIUM 40 MG/0.4ML IJ SOSY
40.0000 mg | PREFILLED_SYRINGE | INTRAMUSCULAR | Status: DC
  Administered 2022-06-08 – 2022-06-20 (×12): 40 mg via SUBCUTANEOUS
  Filled 2022-06-08 (×11): qty 0.4

## 2022-06-08 MED ORDER — LACTATED RINGERS IV SOLN
INTRAVENOUS | Status: DC
Start: 2022-06-08 — End: 2022-06-08

## 2022-06-08 MED ORDER — POLYETHYLENE GLYCOL 3350 17 G PO PACK: 17.0000 g | PACK | Freq: Every day | ORAL | Status: DC | PRN

## 2022-06-08 MED ORDER — SODIUM CHLORIDE 0.9 % IV SOLN
2.0000 g | INTRAVENOUS | Status: DC
  Administered 2022-06-08: 2 g via INTRAVENOUS
  Filled 2022-06-08: qty 20

## 2022-06-08 MED ORDER — ACETAMINOPHEN 650 MG RE SUPP
650.0000 mg | Freq: Four times a day (QID) | RECTAL | Status: DC | PRN
  Filled 2022-06-08: qty 1

## 2022-06-08 MED ORDER — NICOTINE 21 MG/24HR TD PT24
21.0000 mg | MEDICATED_PATCH | Freq: Every day | TRANSDERMAL | Status: DC
  Administered 2022-06-08 – 2022-06-20 (×13): 21 mg via TRANSDERMAL
  Filled 2022-06-08 (×13): qty 1

## 2022-06-08 MED ORDER — METRONIDAZOLE 500 MG/100ML IV SOLN
500.0000 mg | Freq: Two times a day (BID) | INTRAVENOUS | Status: DC
Start: 2022-06-08 — End: 2022-06-11
  Administered 2022-06-08 – 2022-06-11 (×6): 500 mg via INTRAVENOUS
  Filled 2022-06-08 (×6): qty 100

## 2022-06-08 MED ORDER — FLUOXETINE HCL 20 MG PO CAPS
80.0000 mg | ORAL_CAPSULE | Freq: Every day | ORAL | Status: DC
  Administered 2022-06-08 – 2022-06-16 (×9): 80 mg via ORAL
  Filled 2022-06-08 (×10): qty 4

## 2022-06-08 MED ORDER — LACTATED RINGERS IV BOLUS (SEPSIS)
250.0000 mL | Freq: Once | INTRAVENOUS | Status: AC
Start: 2022-06-08 — End: 2022-06-08
  Administered 2022-06-08: 250 mL via INTRAVENOUS

## 2022-06-08 MED ORDER — LACTATED RINGERS IV BOLUS (SEPSIS)
1000.0000 mL | Freq: Once | INTRAVENOUS | Status: AC
  Administered 2022-06-08: 1000 mL via INTRAVENOUS

## 2022-06-08 MED ORDER — GUAIFENESIN ER 600 MG PO TB12
600.0000 mg | ORAL_TABLET | Freq: Two times a day (BID) | ORAL | Status: DC | PRN
  Administered 2022-06-13: 600 mg via ORAL
  Filled 2022-06-08: qty 1

## 2022-06-08 MED ORDER — FLUCONAZOLE 200 MG PO TABS
200.0000 mg | ORAL_TABLET | Freq: Once | ORAL | Status: AC
  Administered 2022-06-08: 200 mg via ORAL
  Filled 2022-06-08: qty 1

## 2022-06-08 MED ORDER — SODIUM CHLORIDE 0.9% FLUSH
3.0000 mL | Freq: Two times a day (BID) | INTRAVENOUS | Status: DC
  Administered 2022-06-08 – 2022-06-19 (×18): 3 mL via INTRAVENOUS

## 2022-06-08 MED ORDER — BUPRENORPHINE HCL-NALOXONE HCL 8-2 MG SL SUBL
1.0000 | SUBLINGUAL_TABLET | Freq: Two times a day (BID) | SUBLINGUAL | Status: DC
  Administered 2022-06-09 – 2022-06-18 (×18): 1 via SUBLINGUAL
  Filled 2022-06-08 (×18): qty 1

## 2022-06-08 MED ORDER — KETOROLAC TROMETHAMINE 30 MG/ML IJ SOLN: 30.0000 mg | Freq: Four times a day (QID) | INTRAMUSCULAR | Status: DC | PRN

## 2022-06-08 MED ORDER — HYDRALAZINE HCL 20 MG/ML IJ SOLN
5.0000 mg | INTRAMUSCULAR | Status: DC | PRN
  Administered 2022-06-17: 5 mg via INTRAVENOUS
  Filled 2022-06-08: qty 1

## 2022-06-08 MED ORDER — ONDANSETRON HCL 4 MG/2ML IJ SOLN
4.0000 mg | Freq: Four times a day (QID) | INTRAMUSCULAR | Status: DC | PRN
  Administered 2022-06-19 – 2022-06-20 (×2): 4 mg via INTRAVENOUS
  Filled 2022-06-08 (×2): qty 2

## 2022-06-08 MED ORDER — ARIPIPRAZOLE 10 MG PO TABS
5.0000 mg | ORAL_TABLET | Freq: Every day | ORAL | Status: DC
  Administered 2022-06-08 – 2022-06-16 (×9): 5 mg via ORAL
  Filled 2022-06-08 (×9): qty 1

## 2022-06-08 MED ORDER — PROPRANOLOL HCL 20 MG PO TABS
20.0000 mg | ORAL_TABLET | Freq: Two times a day (BID) | ORAL | Status: DC
  Administered 2022-06-08 – 2022-06-16 (×16): 20 mg via ORAL
  Filled 2022-06-08 (×17): qty 1

## 2022-06-08 MED ORDER — BISACODYL 5 MG PO TBEC: 5.0000 mg | DELAYED_RELEASE_TABLET | Freq: Every day | ORAL | Status: DC | PRN

## 2022-06-08 MED ORDER — SODIUM CHLORIDE 0.9 % IV SOLN: INTRAVENOUS | Status: DC

## 2022-06-08 MED ORDER — FLUCONAZOLE 100 MG PO TABS
100.0000 mg | ORAL_TABLET | Freq: Every day | ORAL | Status: DC
Start: 2022-06-09 — End: 2022-06-16
  Administered 2022-06-09 – 2022-06-16 (×8): 100 mg via ORAL
  Filled 2022-06-08 (×8): qty 1

## 2022-06-08 MED ORDER — ALBUTEROL SULFATE (2.5 MG/3ML) 0.083% IN NEBU
2.5000 mg | INHALATION_SOLUTION | RESPIRATORY_TRACT | Status: DC | PRN
  Administered 2022-06-14: 2.5 mg via RESPIRATORY_TRACT
  Filled 2022-06-08: qty 3

## 2022-06-08 MED ORDER — KETOROLAC TROMETHAMINE 15 MG/ML IJ SOLN
15.0000 mg | Freq: Once | INTRAMUSCULAR | Status: AC | PRN
Start: 2022-06-08 — End: 2022-06-08
  Administered 2022-06-08: 15 mg via INTRAVENOUS
  Filled 2022-06-08: qty 1

## 2022-06-08 MED ORDER — SODIUM CHLORIDE 0.9 % IV SOLN
500.0000 mg | INTRAVENOUS | Status: DC
  Administered 2022-06-08 – 2022-06-09 (×2): 500 mg via INTRAVENOUS
  Filled 2022-06-08 (×2): qty 5

## 2022-06-08 NOTE — ED Provider Notes (Signed)
[facility] EMERGENCY DEPARTMENT Provider Note   CSN: [id number] Arrival date & time: 06/07/22  2059     History  Chief Complaint  Patient presents with   Shortness of Breath    [name] is a 41 y.o. male.  The history is provided by the patient.  Shortness of Breath Severity:  Moderate Onset quality:  Gradual Duration:  1 week Timing:  Constant Progression:  Worsening Chronicity:  New Relieved by:  Rest Worsened by:  Exertion Associated symptoms: cough   Associated symptoms: no fever and no hemoptysis   Risk factors: tobacco use   Patient presents for concerns for pneumonia.  Patient reports he was hospitalized for 2 weeks for pneumonia in April up in [place].  He was discharged on 3L nasal cannula.  He has since relocated to [facility] and over the past week he has had cough shortness of breath and feels that the pneumonia has returned He is a smoker.  He has a history of anxiety, substance use disorder and hepatitis C. He denies any recent IV drug use.  He reports his oxygen has run out at home     Home Medications Prior to Admission medications   Medication Sig Start Date End Date Taking? Authorizing Provider  METHADONE HCL PO Take 1 tablet by mouth daily.    [provider]      Allergies    Patient has no known allergies.    Review of Systems   Review of Systems  Constitutional:  Negative for fever.  Respiratory:  Positive for cough and shortness of breath. Negative for hemoptysis.     Physical Exam Updated Vital Signs BP (!) 147/108   Pulse 77   Temp 97.8 F (36.6 C) (Oral)   Resp (!) 21   SpO2 98%  Physical Exam CONSTITUTIONAL: Cachectic and ill-appearing HEAD: Normocephalic/atraumatic EYES: EOMI/PERRL ENMT: Mucous membranes moist NECK: supple no meningeal signs SPINE/BACK:entire spine nontender CV: S1/S2 noted, no loud murmurs LUNGS: Crackles noted diffusely in the left lung fields, right lung  clear ABDOMEN: soft, mild diffuse tenderness NEURO: Pt is awake/alert/appropriate, moves all extremitiesx4.  No facial droop.   EXTREMITIES: pulses normal/equal, full ROM, scarring noted all extremities.  No lower extremity edema SKIN: warm, color normal PSYCH: no abnormalities of mood noted, alert and oriented to situation  ED Results / Procedures / Treatments   Labs (all labs ordered are listed, but only abnormal results are displayed) Labs Reviewed  CBC WITH DIFFERENTIAL/PLATELET - Abnormal; Notable for the following components:      Result Value   WBC 16.2 (*)    RBC 4.03 (*)    Hemoglobin 10.8 (*)    HCT 32.2 (*)    MCV 79.9 (*)    Platelets 476 (*)    Neutro Abs 14.7 (*)    Abs Immature Granulocytes 0.10 (*)    All other components within normal limits  COMPREHENSIVE METABOLIC PANEL - Abnormal; Notable for the following components:   Sodium 134 (*)    Chloride 97 (*)    BUN 31 (*)    Calcium 8.6 (*)    Total Protein 6.4 (*)    Albumin 2.4 (*)    All other components within normal limits  RESP PANEL BY RT-PCR (FLU A&B, COVID) ARPGX2  CULTURE, BLOOD (ROUTINE X 2)  CULTURE, BLOOD (ROUTINE X 2)  LACTIC ACID, PLASMA  LACTIC ACID, PLASMA  HIV ANTIBODY (ROUTINE TESTING W REFLEX)    EKG EKG Interpretation  Date/Time:  Thursday June 07 2022 21:23:56 EDT Ventricular Rate:  82 PR Interval:  150 QRS Duration: 100 QT Interval:  406 QTC Calculation: 474 R Axis:   45 Text Interpretation: Normal sinus rhythm Normal ECG Confirmed by Zadie Rhine ([id number]) on 06/08/2022 2:23:11 AM  Radiology DG Chest 1 View  Result Date: 06/07/2022 CLINICAL DATA:  Pneumonia.  Shortness of breath.  Cough. EXAM: CHEST  1 VIEW COMPARISON:  08/01/2011 FINDINGS: Airspace disease in the left upper lobe compatible with pneumonia. No confluent opacity on the right. Heart is normal size. No effusions or acute bony abnormality. IMPRESSION: Left upper lobe pneumonia. Electronically Signed   By: Charlett Nose M.D.   On: 06/07/2022 22:17    Procedures .Critical Care  Performed by: Zadie Rhine, MD Authorized by: Zadie Rhine, MD   Critical care provider statement:    Critical care time (minutes):  61   Critical care start time:  06/08/2022 2:43 AM   Critical care end time:  06/08/2022 3:44 AM   Critical care time was exclusive of:  Separately billable procedures and treating other patients   Critical care was necessary to treat or prevent imminent or life-threatening deterioration of the following conditions:  Sepsis, respiratory failure and dehydration   Critical care was time spent personally by me on the following activities:  Development of treatment plan with patient or surrogate, examination of patient, obtaining history from patient or surrogate, re-evaluation of patient's condition, pulse oximetry, ordering and review of radiographic studies, ordering and review of laboratory studies and ordering and performing treatments and interventions   I assumed direction of critical care for this patient from another provider in my specialty: no     Care discussed with: admitting provider       Medications Ordered in ED Medications  lactated ringers infusion (has no administration in time range)  lactated ringers bolus 1,000 mL (1,000 mLs Intravenous New Bag/Given 06/08/22 0304)    And  lactated ringers bolus 1,000 mL (has no administration in time range)    And  lactated ringers bolus 250 mL (has no administration in time range)  cefTRIAXone (ROCEPHIN) 2 g in sodium chloride 0.9 % 100 mL IVPB (2 g Intravenous New Bag/Given 06/08/22 0307)  azithromycin (ZITHROMAX) 500 mg in sodium chloride 0.9 % 250 mL IVPB (500 mg Intravenous New Bag/Given 06/08/22 0306)    ED Course/ Medical Decision Making/ A&P Clinical Course as of 06/08/22 0346  Fri Jun 08, 2022  0221 WBC(!): 16.2 Leukocytosis [DW]  0222 Hemoglobin(!): 10.8 Anemia [DW]  0345 Patient ill-appearing with obvious pneumonia on  exam and by x-ray.  He has no local primary care amount of his oxygen.  He will need to be admitted for pneumonia treatment and establishment of care.  Patient is cachectic and appears older than stated age. [DW]  [phone] Given the x-ray and clinical exam findings, this is most consistent with pneumonia.  Discussed with Dr. Antionette Char for admission [DW]    Clinical Course User Index [DW] Zadie Rhine, MD                           Medical Decision Making Amount and/or Complexity of Data Reviewed Labs: ordered. Decision-making details documented in ED Course.  Risk Prescription drug management. Decision regarding hospitalization.   This patient presents to the ED for concern of shortness of breath, this involves an extensive number of treatment options, and is a complaint that carries with it a high  risk of complications and morbidity.  The differential diagnosis includes but is not limited to Acute coronary syndrome, pneumonia, acute pulmonary edema, pneumothorax, acute anemia, pulmonary embolism   Comorbidities that complicate the patient evaluation: Patient's presentation is complicated by their history of anxiety, recent  Social Determinants of Health: Patient's lack of prescription access, impaired access to primary care, and lack of home oxygen   increases the complexity of managing their presentation  Additional history obtained: No recent medical records to review  Lab Tests: I Ordered, and personally interpreted labs.  The pertinent results include: Leukocytosis  Imaging Studies ordered: I ordered imaging studies including X-ray chest x-ray   I independently visualized and interpreted imaging which showed left upper lobe pneumonia I agree with the radiologist interpretation  Cardiac Monitoring: The patient was maintained on a cardiac monitor.  I personally viewed and interpreted the cardiac monitor which showed an underlying rhythm of:  sinus rhythm  Medicines ordered and  prescription drug management: I ordered medication including IV fluids and IV antibiotic for sepsis Reevaluation of the patient after these medicines showed that the patient    stayed the same   Critical Interventions:  IV fluids and antibiotics  Consultations Obtained: I requested consultation with the admitting physician Dr. Antionette Char , and discussed  findings as well as pertinent plan - they recommend: Admission  Reevaluation: After the interventions noted above, I reevaluated the patient and found that they have :stayed the same  Complexity of problems addressed: Patient's presentation is most consistent with  acute presentation with potential threat to life or bodily function  Disposition: After consideration of the diagnostic results and the patient's response to treatment,  I feel that the patent would benefit from admission   .           Final Clinical Impression(s) / ED Diagnoses Final diagnoses:  Community acquired pneumonia of left upper lobe of lung  Acute respiratory failure with hypoxia [facility])    Rx / DC Orders ED Discharge Orders     None         Zadie Rhine, MD 06/08/22 [phone]

## 2022-06-08 NOTE — Progress Notes (Signed)
Pharmacy Antibiotic Note  [name] is a 41 y.o. male admitted on 06/07/2022 with  severe thrush .  Pharmacy has been consulted for fluconazole dosing.  Plan: Fluconazole 200mg  PO x 1, followed by Fluconazole 100mg  PO q24h x 20 days Continue Ceftriaxone and Azithromycin per primary provider  F/u for PO status and need to transition to IV route Monitor CBC, renal function, and clinical s/sx of improvement    Temp (24hrs), Avg:98.1 F (36.7 C), Min:97.8 F (36.6 C), Max:98.3 F (36.8 C)  Recent Labs  Lab 06/07/22 2203 06/08/22 0250 06/08/22 0539  WBC 16.2*  --  13.1*  CREATININE 0.71  --  0.68  LATICACIDVEN  --  0.6  --     CrCl cannot be calculated (Unknown ideal weight.).    No Known Allergies  Antimicrobials this admission: Ceftriaxone 6/16 >>  Azithromycin 6/16 >>  Fluconazole 6/16 >>   Dose adjustments this admission: N/A  Microbiology results: 6/16 BCx: NGTD <12h  Thank you for allowing pharmacy to be a part of this patient's care.  7/16 06/08/2022 10:31 AM

## 2022-06-08 NOTE — Plan of Care (Signed)

## 2022-06-08 NOTE — ED Notes (Signed)
This RN attempted to swab pt for COVID and the Flu. Pt stated he was currently eating and I needed to come back later. Pt also stated his lungs are hurting and he needs pain medicine. This RN sent a message to the admitting team and will continue to monitor.

## 2022-06-08 NOTE — H&P (Signed)
History and Physical    Patient: [name] OVZ:[id number] DOB: 02-Dec-1981 DOA: 06/07/2022 DOS: the patient was seen and examined on 06/08/2022 PCP: Pcp, No  Patient coming from: Home - lives with friends - moved in yesterday, came here from [place]; NOK: Giovani Neumeister, mother, [phone]   Chief Complaint: SOB  HPI: [name] is a 41 y.o. male with medical history significant of polysubstance abuse presenting with SOB.  He reports PNA in April, hospitalized for 2 1/2 weeks.  Hospitalized again the next month for 4 days.  Now here again  with the same - left-sided CP and into the back, coughing with severe pain, cough productive of green and gray sputum, SOB.  After last dc, he was discharged on 3L O2 and he wasn't able to bring the tank with him during travel and so has not had O2 for the last 2 days.  He has lost 47 pounds since this all started.  No night sweats.  No HIV risk factors.  He was a heroin addict for a long time, has been in treatment, needs Suboxone program (has enough to last him a month).  No fevers.  His most recent admission was for cachexia and FTT in [place] from 5/30-6/2.  He also had recurrent aspiration PNA.  Preserved EF on TTE.  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  "He declined enteral nutrition despite multiple providers concerns regarding risk of ongoing aspiration."  He was recommended to remain NPO but opted to eat/drink instead regardless of risk.    He had a telemedicine clinic visit on 6/6 and noted inability to pay for housing, inability to obtain transportation for f/u in clinic.    He followed up with psychiatry on 6/14 and "reported that he is leaving tonight to go to [place]" with plans to "go straight to the ER to establish care."  He was encouraged to get a "feeding tube for a little while".  He apparently set up a GoFundMe account to pay for his  one-way ticket to [place].    Meds as of 6/16: Suboxone 16 mg qAM, 2 mg qPM, 4 mg qhs Prozac 80 mg daily Propranolol 20 mg BID Abilify 5 mg daily    ER Course:  Carryover, per Dr. Antionette Char:  41 yr old man with hx of substance abuse in remission, opiate dependence, and admission in [place] for pneumonia in April who presents with recurrent SOB and productive cough and is found to have LUL pneumonia and hypoxia. He reports being discharge with home O2 after admission in [place].      Review of Systems: As mentioned in the history of present illness. All other systems reviewed and are negative. 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    Colitis    Depression    Diverticulitis    Drug abuse (HCC)    Hepatitis C    MRSA (methicillin resistant staph aureus) culture positive    Past Surgical History:  Procedure Laterality Date   COLONOSCOPY     I & D EXTREMITY  11/10/2011   Procedure: IRRIGATION AND DEBRIDEMENT EXTREMITY;  Surgeon: Kathryne Hitch;  Location: [facility] OR;  Service: Orthopedics;  Laterality: Right;  with volar compartment release   I & D EXTREMITY  12/24/2011   Procedure: IRRIGATION AND DEBRIDEMENT EXTREMITY;  Surgeon:  Eldred Manges;  Location: [facility] OR;  Service: Orthopedics;  Laterality: Right;   Social History:  reports that he has been smoking cigarettes and e-cigarettes. He has a 18.00 pack-year smoking history. He has never used smokeless tobacco. He reports current drug use. Frequency: 1.00 time per week. Drugs: Marijuana, Methamphetamines, and Heroin. He reports that he does not drink alcohol.  No Known Allergies  History reviewed. No pertinent family history.  Prior to Admission medications   Medication Sig Start Date End Date Taking? Authorizing Provider  ARIPiprazole (ABILIFY) 10 MG tablet Take 10 mg by mouth daily.   Yes [provider]  FLUoxetine (PROZAC) 40 MG capsule Take 80 mg by mouth daily.    Yes [provider]  hydrOXYzine (ATARAX) 25 MG tablet Take 25 mg by mouth 3 (three) times daily as needed for anxiety.   Yes [provider]    Physical Exam: Vitals:   06/08/22 1345 06/08/22 1400 06/08/22 1544 06/08/22 1551  BP: 135/88 (!) 137/120  (!) 147/102  Pulse: 65 95  62  Resp: 20 14    Temp:    97.6 F (36.4 C)  TempSrc:    Oral  SpO2: 97% 93%  93%  Weight:   52 kg   Height:   5\' 9"  (1.753 m)    General:  Appears very chronically ill - cachectic and frail, much older than stated age Eyes:  PERRL, EOMI, normal lids, iris ENT:  grossly normal hearing, lips & tongue, mmm Neck:  no LAD, masses or thyromegaly Cardiovascular:  RRR, no m/r/g. No LE edema.  Respiratory:   Diffuse rhonchi.  Normal respiratory effort. Abdomen:  soft, NT, ND Skin:  no rash or induration seen on limited exam; diffuse tattoos including "PRIDE" along his posterior neck Musculoskeletal:  grossly normal tone BUE/BLE, good ROM, no bony abnormality Psychiatric:  blunted mood and affect, speech fluent and appropriate, AOx3 Neurologic:  CN 2-12 grossly intact, moves all extremities in coordinated fashion   Radiological Exams on Admission: Independently reviewed - see discussion in A/P where applicable  DG Chest 1 View  Result Date: 06/07/2022 CLINICAL DATA:  Pneumonia.  Shortness of breath.  Cough. EXAM: CHEST  1 VIEW COMPARISON:  08/01/2011 FINDINGS: Airspace disease in the left upper lobe compatible with pneumonia. No confluent opacity on the right. Heart is normal size. No effusions or acute bony abnormality. IMPRESSION: Left upper lobe pneumonia. Electronically Signed   By: 10/01/2011 M.D.   On: 06/07/2022 22:17    EKG: Independently reviewed.  NSR with rate 82; no evidence of acute ischemia   Labs on Admission: I have personally reviewed the available labs and imaging studies at the time of the admission.  Pertinent labs:    Unremarkable/stable BMP WBC 13.1 Hgb  10.4 Platelets 440 Lactate 0.6 COVID/flu negative Blood cultures pending Procalcitonin 0.72   Assessment and Plan: Principal Problem:   Left upper lobe pneumonia Active Problems:   Opiate dependence, continuous (HCC)   Dysphagia   Tobacco dependence   Protein-calorie malnutrition, severe (HCC)    CAP -Patient with recurrent PNA presenting for the same -He was last hospitalized in 06/09/20[place] about 2 weeks ago; badly failed swallow evaluation (see below) and was recommended for enteral feedings; he declined and accepted the risk of continuing to eat -He also was discharged with home O2 but moved to [place] 2 days ago without O2 -Now presenting with productive cough, mildly decreased oxygen saturation, and infiltrate in left upper lobe on  chest x-ray -Infiltrate on CXR and 2-3 characteristics (leukocytosis, purulent sputum) are consistent with pneumonia. -This appears to be most likely aspiration > community-acquired pneumonia.  -Influenza negative. -COVID-19 negative. 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 -He was recommended for at least short-term PEG tube -Declined, desired ongoing PO intake despite risk -For now, will continue diet with plan for ST evaluation - understanding the risk for ongoing aspiration  Opiate dependence -Patient with h/o opiate dependence, on Suboxone -Has a 36-month supply, will need outpatient f/u -Will continue suboxone for now -He is continuing to use amphetamines and THC -It was explicitly explained that he is NOT being admitted for pain control and that he will not receive opiates -He was given Tylenol  and Toradol in addition to Suboxone  Tobacco dependence -Continues to smoke and vape -Counseling provided, cessation encouraged -Patch ordered  Malnutrition -Body mass index is 16.93 kg/m..  -The patient has at least 2 indicators for malnutrition (insufficient energy intake, weight loss, loss of muscle mass, loss of subcutaneous fat.  -This is likely due to chronic disease -Will obtain a nutrition consult for further recommendations.      Advance Care Planning:   Code Status: Full Code   Consults: Pulmonology; ID; TOC team; RT; nutrition; SLP  DVT Prophylaxis: Lovenox  Family Communication: None present; he declined to have me contact family at the time of admission  Severity of Illness: The appropriate patient status for this patient is INPATIENT. Inpatient status is judged to be reasonable and necessary in order to provide the required intensity of service to ensure the patient's safety. The patient's presenting symptoms, physical exam findings, and initial radiographic and laboratory data in the context of their chronic comorbidities is felt to place them at high risk for further clinical deterioration. Furthermore, it is not anticipated that the patient will be medically stable for discharge from the hospital within 2 midnights of admission.   * I certify that at the point of admission it is my clinical judgment that the patient will require inpatient hospital care spanning beyond 2 midnights from the point of admission due to high intensity of service, high risk for further deterioration and high frequency of surveillance required.*  Author: Jonah Blue, MD 06/08/2022 6:30 PM  For on call review [email or web address].

## 2022-06-08 NOTE — Consult Note (Addendum)
[facility]  Total days of antibiotics 2         Reason for Consult: recurrent pneumonia    Referring Physician: yates  Principal Problem:   Left upper lobe pneumonia    HPI: [name] is a 41 y.o. 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 He has opiate use disorder on suboxone. Lived in [place] x 10 years and recently arrived to [place] on 6/15 to relocate to [place] to live with friends.due to feeling poorly with worsening cough, fatigue -he presented to the ED for evaluation. He was found to have leukocytosis and LUL infiltrate on cxr per my read  He reports that he did not feel fully recovered from pneumonia in April, however in the last 10 days increasing productive sputum, and cough. Denies fever, no gi and gu symptoms. Also in review of system, he reports weight loss > 40 lb in the past 12 months- appears to have lost 10 lb since April and susbcribes to food insecurity.  Based on records, he was #121 in Jan 2023.  Looking back in care everywhere:  He saw psychiatry and family practice regularly (buprenorphine-naloxone (SUBOXONE) 8-2 mg film; Dissolve 2 strips (16mg ) in the morning, 1/4 strip (2mg ) in the afternoon, 1/2 strip (4mg ) in the evening. )  2. 5/30-6/2 admission admitted for shortness of breath and LLQ pain, found to have apical PTX and aspiration pneumonitis. Severe protein calorie malnutrition - (weight 113) 3. 4/14-4/28 MSSA sepsis admission (weight of 119) 4. 01/1501/16 neck abscess admission (weight of 125) 5. 1/28 admission for neck abscess tx  with 10 day course of amox/clav(weight of 121)./ was ruled out for TB with 3 negative smears per discharge summary   6.previously in  in 2012-2013, had multiple admission for MRSA abscess requiring I x D. At that time, his weight of 175. ------------------ April historic micro Enterobacter cloacae Ampicillin + Sulbactam MIC <=8/4 ug/ml: Resistant  Enterobacter cloacae Ampicillin MIC >16 ug/ml: Resistant  Enterobacter cloacae Amoxicillin + Clavulanate MIC >16/8 ug/ml: Resistant  Enterobacter cloacae Ceftriaxone MIC <=1 ug/ml: Susceptible  Enterobacter cloacae Cefazolin MIC >16 ug/ml: Resistant  Enterobacter cloacae Cefepime MIC <=2 ug/ml: Susceptible  Enterobacter cloacae Gentamicin MIC <=4 ug/ml: Susceptible  Enterobacter cloacae Imipenem MIC <=1 ug/ml: Susceptible  Enterobacter cloacae Levofloxacin MIC <=2 ug/ml: Susceptible  Enterobacter cloacae Piperacillin + Tazobactam MIC <=16 ug/ml: Susceptible  Enterobacter cloacae Trimethoprim + Sulfamethoxazole MIC <=2/38 ug/ml: Susceptible  Klebsiella pneumoniae Ampicillin + Sulbactam MIC <=8/4 ug/ml: Susceptible  Klebsiella pneumoniae Ampicillin MIC 16 ug/ml: Resistant  Klebsiella pneumoniae Amoxicillin + Clavulanate MIC <=8/4 ug/ml: Susceptible  Klebsiella pneumoniae Ceftriaxone MIC <=1 ug/ml: Susceptible  Klebsiella pneumoniae Cefazolin MIC <=2 ug/ml: Susceptible  Klebsiella pneumoniae Cefepime MIC <=2 ug/ml: Susceptible  Klebsiella pneumoniae Gentamicin MIC <=4 ug/ml: Susceptible  Klebsiella pneumoniae Imipenem MIC <=1 ug/ml: Susceptible  Klebsiella pneumoniae Levofloxacin MIC <=2 ug/ml: Susceptible  Klebsiella pneumoniae Piperacillin + Tazobactam MIC <=16 ug/ml: Susceptible  Klebsiella pneumoniae Trimethoprim + Sulfamethoxazole MIC <=2/38 ug/ml: Susceptible    On historic micro results: cx from 01/21/22 - subcultured NTM, nonTb without further identification Past Medical History:  Diagnosis Date   Active smoker  Anxiety     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    Colitis    Depression    Diverticulitis    Drug abuse (HCC)    Hepatitis C    MRSA (methicillin resistant staph aureus) culture positive     Allergies: No Known Allergies   MEDICATIONS:  ARIPiprazole  5 mg Oral Daily   [START ON 06/09/2022] buprenorphine-naloxone  1 tablet Sublingual BID   docusate sodium  100 mg Oral BID   enoxaparin (LOVENOX) injection  40 mg Subcutaneous Q24H   [START ON 06/09/2022] fluconazole  100 mg Oral Daily   FLUoxetine  80 mg Oral Daily   nicotine  21 mg Transdermal Daily   propranolol  20 mg Oral BID   sodium chloride flush  3 mL Intravenous Q12H    Social History   Tobacco Use   Smoking status: Every Day    Packs/day: 1.00    Years: 18.00    Total pack years: 18.00    Types: Cigarettes, E-cigarettes   Smokeless tobacco: Never   Tobacco comments:    Requests nicotine patch  Substance Use Topics   Alcohol use: No   Drug use: Yes    Frequency: 1.0 times per week    Types: Marijuana, Methamphetamines, Heroin    Comment: heroin use remotely (last 10 years ago), amphetamines within the month, uses marijuana daily    History reviewed. No pertinent family history.  Review of Systems -  12 point ros is negative except what is mentioned above. +dysphonia  OBJECTIVE: Temp:  [97.6 F (36.4 C)-98.3 F (36.8 C)] 97.6 F (36.4 C) (06/16 1551) Pulse Rate:  [55-95] 62 (06/16 1551) Resp:  [14-24] 14 (06/16 1400) BP: (114-156)/(81-120) 147/102 (06/16 1551) SpO2:  [88 %-100 %] 93 % (06/16 1551) Weight:  [52 kg] 52 kg (06/16 1544) Physical Exam  Constitutional: He is oriented to person, place, and time. He appears cachetic and ill appearing. No distress.  HENT:  Mouth/Throat: Oropharynx is clear and moist. No oropharyngeal exudate.  Cardiovascular: Normal rate, regular rhythm and normal heart sounds. Exam reveals no gallop and no friction rub.  No murmur heard.  Pulmonary/Chest: prominent rib markings Effort normal  and breath sounds normal. No respiratory distress. He has no wheezes.  Abdominal: Soft. Bowel sounds are normal. He exhibits no distension. There is no tenderness. Scaphoid abdomen Lymphadenopathy:  He has no cervical adenopathy.  Neurological: He is alert and oriented to person, place, and time. +dysphonia Skin: Skin is warm and dry. No rash noted. No erythema.  Psychiatric: He has a normal mood and affect. His behavior is normal.    LABS: Results for orders placed or performed during the hospital encounter of 06/07/22 (from the past 48 hour(s))  CBC with Differential     Status: Abnormal   Collection Time: 06/07/22 10:03 PM  Result Value Ref Range   WBC 16.2 (H) 4.0 - 10.5 K/uL   RBC 4.03 (L) 4.22 - 5.81 MIL/uL   Hemoglobin 10.8 (L) 13.0 - 17.0 g/dL   HCT 94.4 (L) 96.7 - 59.1 %   MCV 79.9 (L) 80.0 - 100.0 fL   MCH 26.8 26.0 - 34.0 pg   MCHC 33.5 30.0 - 36.0 g/dL   RDW 63.8 46.6 - 59.9 %   Platelets 476 (H) 150 - 400 K/uL   nRBC 0.0 0.0 - 0.2 %   Neutrophils Relative % 90 %   Neutro Abs 14.7 (H) 1.7 - 7.7 K/uL   Lymphocytes Relative 5 %  Lymphs Abs 0.8 0.7 - 4.0 K/uL   Monocytes Relative 4 %   Monocytes Absolute 0.6 0.1 - 1.0 K/uL   Eosinophils Relative 0 %   Eosinophils Absolute 0.0 0.0 - 0.5 K/uL   Basophils Relative 0 %   Basophils Absolute 0.0 0.0 - 0.1 K/uL   Immature Granulocytes 1 %   Abs Immature Granulocytes 0.10 (H) 0.00 - 0.07 K/uL    Comment: Performed at [facility], [address] [place], [place] [address]  Comprehensive metabolic panel     Status: Abnormal   Collection Time: 06/07/22 10:03 PM  Result Value Ref Range   Sodium 134 (L) 135 - 145 mmol/L   Potassium 4.0 3.5 - 5.1 mmol/L   Chloride 97 (L) 98 - 111 mmol/L   CO2 28 22 - 32 mmol/L   Glucose, Bld 82 70 - 99 mg/dL    Comment: Glucose reference range applies only to samples taken after fasting for at least 8 hours.   BUN 31 (H) 6 - 20 mg/dL   Creatinine, Ser 7.85 0.61 - 1.24 mg/dL    Calcium 8.6 (L) 8.9 - 10.3 mg/dL   Total Protein 6.4 (L) 6.5 - 8.1 g/dL   Albumin 2.4 (L) 3.5 - 5.0 g/dL   AST 39 15 - 41 U/L   ALT 34 0 - 44 U/L   Alkaline Phosphatase 116 38 - 126 U/L   Total Bilirubin 0.5 0.3 - 1.2 mg/dL   GFR, Estimated >88 >50 mL/min    Comment: (NOTE) Calculated using the CKD-EPI Creatinine Equation (2021)    Anion gap 9 5 - 15    Comment: Performed at [facility], [address] [place], [place] [address]  Resp Panel by RT-PCR (Flu A&B, Covid) Anterior Nasal Swab     Status: None   Collection Time: 06/08/22  2:40 AM   Specimen: Anterior Nasal Swab  Result Value Ref Range   SARS Coronavirus 2 by RT PCR NEGATIVE NEGATIVE    Comment: (NOTE) SARS-CoV-2 target nucleic acids are NOT DETECTED.  The SARS-CoV-2 RNA is generally detectable in upper respiratory specimens during the acute phase of infection. The lowest concentration of SARS-CoV-2 viral copies this assay can detect is 138 copies/mL. A negative result does not preclude SARS-Cov-2 infection and should not be used as the sole basis for treatment or other patient management decisions. A negative result may occur with  improper specimen collection/handling, submission of specimen other than nasopharyngeal swab, presence of viral mutation(s) within the areas targeted by this assay, and inadequate number of viral copies(<138 copies/mL). A negative result must be combined with clinical observations, patient history, and epidemiological information. The expected result is Negative.  Fact Sheet for Patients:  BloggerCourse.com  Fact Sheet for Healthcare Providers:  SeriousBroker.it  This test is no t yet approved or cleared by the [place] FDA and  has been authorized for detection and/or diagnosis of SARS-CoV-2 by FDA under an Emergency Use Authorization (EUA). This EUA will remain  in effect (meaning this test can be used) for the duration of  the COVID-19 declaration under Section 564(b)(1) of the Act, 21 U.S.C.section 360bbb-3(b)(1), unless the authorization is terminated  or revoked sooner.       Influenza A by PCR NEGATIVE NEGATIVE   Influenza B by PCR NEGATIVE NEGATIVE    Comment: (NOTE) The Xpert Xpress SARS-CoV-2/FLU/RSV plus assay is intended as an aid in the diagnosis of influenza from Nasopharyngeal swab specimens and should not be  used as a sole basis for treatment. Nasal washings and aspirates are unacceptable for Xpert Xpress SARS-CoV-2/FLU/RSV testing.  Fact Sheet for Patients: BloggerCourse.com  Fact Sheet for Healthcare Providers: SeriousBroker.it  This test is not yet approved or cleared by the [place] FDA and has been authorized for detection and/or diagnosis of SARS-CoV-2 by FDA under an Emergency Use Authorization (EUA). This EUA will remain in effect (meaning this test can be used) for the duration of the COVID-19 declaration under Section 564(b)(1) of the Act, 21 U.S.C. section 360bbb-3(b)(1), unless the authorization is terminated or revoked.  Performed at [facility], [address], [place], [place] [address]   Lactic acid, plasma     Status: None   Collection Time: 06/08/22  2:50 AM  Result Value Ref Range   Lactic Acid, Venous 0.6 0.5 - 1.9 mmol/L    Comment: Performed at [facility], [address] [place], [place] [address]  Blood Culture (routine x 2)     Status: None (Preliminary result)   Collection Time: 06/08/22  2:50 AM   Specimen: BLOOD LEFT FOREARM  Result Value Ref Range   Specimen Description BLOOD LEFT FOREARM    Special Requests      BOTTLES DRAWN AEROBIC AND ANAEROBIC Blood Culture adequate volume   Culture      NO GROWTH < 12 HOURS Performed at [facility], [address], [place], [place] [address]    Report Status PENDING   Blood Culture (routine x 2)     Status: None (Preliminary  result)   Collection Time: 06/08/22  2:50 AM   Specimen: BLOOD RIGHT FOREARM  Result Value Ref Range   Specimen Description BLOOD RIGHT FOREARM    Special Requests      BOTTLES DRAWN AEROBIC AND ANAEROBIC Blood Culture adequate volume   Culture      NO GROWTH < 12 HOURS Performed at [facility], [address] [place], [place] [address]    Report Status PENDING   HIV Antibody (routine testing w rflx)     Status: None   Collection Time: 06/08/22  2:50 AM  Result Value Ref Range   HIV Screen 4th Generation wRfx Non Reactive Non Reactive    Comment: Performed at [facility], [address], [place], [place] [address]  CBC     Status: Abnormal   Collection Time: 06/08/22  5:39 AM  Result Value Ref Range   WBC 13.1 (H) 4.0 - 10.5 K/uL   RBC 3.93 (L) 4.22 - 5.81 MIL/uL   Hemoglobin 10.4 (L) 13.0 - 17.0 g/dL   HCT 56.3 (L) 89.3 - 73.4 %   MCV 80.4 80.0 - 100.0 fL   MCH 26.5 26.0 - 34.0 pg   MCHC 32.9 30.0 - 36.0 g/dL   RDW 28.7 68.1 - 15.7 %   Platelets 440 (H) 150 - 400 K/uL   nRBC 0.0 0.0 - 0.2 %    Comment: Performed at [facility], [address], [place], [place] [address]  Basic metabolic panel     Status: Abnormal   Collection Time: 06/08/22  5:39 AM  Result Value Ref Range   Sodium 134 (L) 135 - 145 mmol/L   Potassium 5.2 (H) 3.5 - 5.1 mmol/L    Comment: SPECIMEN HEMOLYZED. HEMOLYSIS MAY AFFECT INTEGRITY OF RESULTS. DELTA CHECK NOTED    Chloride 98 98 - 111 mmol/L   CO2 27 22 - 32 mmol/L  Glucose, Bld 77 70 - 99 mg/dL    Comment: Glucose reference range applies only to samples taken after fasting for at least 8 hours.   BUN 30 (H) 6 - 20 mg/dL   Creatinine, Ser 3.41 0.61 - 1.24 mg/dL   Calcium 8.3 (L) 8.9 - 10.3 mg/dL   GFR, Estimated >93 >79 mL/min    Comment: (NOTE) Calculated using the CKD-EPI Creatinine Equation (2021)    Anion gap 9 5 - 15    Comment: Performed at [facility], [address], [place], [place] [address]  Rapid  urine drug screen (hospital performed)     Status: Abnormal   Collection Time: 06/08/22  9:57 AM  Result Value Ref Range   Opiates NONE DETECTED NONE DETECTED   Cocaine NONE DETECTED NONE DETECTED   Benzodiazepines NONE DETECTED NONE DETECTED   Amphetamines POSITIVE (A) NONE DETECTED   Tetrahydrocannabinol POSITIVE (A) NONE DETECTED   Barbiturates NONE DETECTED NONE DETECTED    Comment: (NOTE) DRUG SCREEN FOR MEDICAL PURPOSES ONLY.  IF CONFIRMATION IS NEEDED FOR ANY PURPOSE, NOTIFY LAB WITHIN 5 DAYS.  LOWEST DETECTABLE LIMITS FOR URINE DRUG SCREEN Drug Class                     Cutoff (ng/mL) Amphetamine and metabolites    1000 Barbiturate and metabolites    200 Benzodiazepine                 200 Tricyclics and metabolites     300 Opiates and metabolites        300 Cocaine and metabolites        300 THC                            50 Performed at [facility], [address] [place], [place] [address]     MICRO: reviewed IMAGING: DG Chest 1 View  Result Date: 06/07/2022 CLINICAL DATA:  Pneumonia.  Shortness of breath.  Cough. EXAM: CHEST  1 VIEW COMPARISON:  08/01/2011 FINDINGS: Airspace disease in the left upper lobe compatible with pneumonia. No confluent opacity on the right. Heart is normal size. No effusions or acute bony abnormality. IMPRESSION: Left upper lobe pneumonia. Electronically Signed   By: Charlett Nose M.D.   On: 06/07/2022 22:17    HISTORICAL MICRO/IMAGING  Assessment/Plan: 41yo M with severe protein calorie malnutrition, high aspiration risk, previous MSSA/Kleb/enterobacter colonized. Admitted for pneumonia  Would get sputum culture, will follow on culture results Recommend to change to cefepime and metronidazole. Keep azithro and follow up on urine legionella ab Will check MRSA PCR to decide to d/c vanco  In terms on NTM, it was only isolated in January culture and has had further. Not necessarily sure it is cause of current event  Hx of food  insecurity = would recommend nutrition consult to see if he is able to reach his nutritional goals with food and supplemental foods. is at risk for refeeding syndrome?  Deemed high risk for aspiration at previous hospitalization - recommend repeat swallow study to see if this is the case  Opiate use disorder = see his suboxone use above

## 2022-06-08 NOTE — ED Notes (Signed)
Patient provided with coke and sandwich per request

## 2022-06-08 NOTE — Consult Note (Signed)
NAME:  [name], MRN:  [id number], DOB:  [birth year], LOS: 0 ADMISSION DATE:  06/07/2022, CONSULTATION DATE: 06/11/2022 REFERRING MD: Triad, CHIEF COMPLAINT: Hypoxia weight loss  History of Present Illness:  41 year old male with a with a known history of polysubstance abuse reportedly on Suboxone.  He is in [place] and treated for multiple pneumonias that were refractory to treatment.  He took an airplane back to [place] but did not bring his oxygen which is mostly at 3 L.  Presented to emergency department [facility] hypoxic chest x-ray was suspicious for pneumonia.  White follow-up records from [place] to guide [place] in our treatment orientation.  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 rate 20, SpO2 97 %.        Intake/Output Summary (Last 24 hours) at 06/08/2022 1024 Last data filed at 06/08/2022 0940 Gross per 24 hour  Intake 3410 ml  Output --  Net 3410 ml   There were no vitals filed for this visit.  Examination: General: Cachectic male in no acute distress HENT: No JVD or lymphadenopathy is appreciated, poor dentition oropharynx is dry Lungs: Diminished on the left currently on 3 L nasal cannula with adequate saturations Cardiovascular: Heart sounds are regular Abdomen: Scaphoid positive bowel sounds  Extremities: Pale without edema Neuro: Intact  without focal defect GU: Voids  Resolved Hospital Problem list   Acute less than 20 redislocated  Assessment & Plan:  41 year old male with refractory pneumonia await all records from [place] also with extensive weight loss very suspicious for HIV.  He is a known IV drug abuser who reportedly is now on the Suboxone program. Continue antimicrobial therapy Panculture Check HIV Check UDS Obtain records from [facility]  History of polysubstance abuse reportedly on Suboxone Check UDS  Extensive weight loss HIV testing has been ordered  [employer] (right click and "Reselect all SmartList Selections" daily)   Diet/type: Regular consistency (see orders) DVT prophylaxis: not indicated GI prophylaxis: PPI Lines: N/A Foley:  N/A Code Status:  full code Last date of multidisciplinary goals of care discussion [tbd]  Labs   CBC: Recent Labs  Lab 06/07/22 2203 06/08/22 0539  WBC 16.2* 13.1*  NEUTROABS 14.7*  --   HGB 10.8* 10.4*  HCT 32.2* 31.6*  MCV 79.9* 80.4  PLT 476* 440*    Basic Metabolic Panel: Recent Labs  Lab 06/07/22 2203 06/08/22 0539  NA 134* 134*  K 4.0 5.2*  CL 97* 98  CO2 28 27  GLUCOSE 82 77  BUN 31* 30*  CREATININE 0.71 0.68  CALCIUM 8.6* 8.3*   GFR: CrCl cannot be calculated (Unknown ideal weight.). Recent Labs  Lab 06/07/22 2203 06/08/22 0250 06/08/22 0539  WBC 16.2*  --  13.1*  LATICACIDVEN  --  0.6  --     Liver Function Tests: Recent Labs  Lab 06/07/22 2203  AST 39  ALT 34  ALKPHOS 116  BILITOT 0.5  PROT 6.4*  ALBUMIN 2.4*   No results for input(s): "LIPASE", "AMYLASE" in the last 168 hours. No results for input(s): "AMMONIA" in the last 168 hours.  ABG    Component Value Date/Time   TCO2 24 09/09/2011 1711     Coagulation Profile: No results for input(s): "INR", "PROTIME" in the last 168 hours.  Cardiac Enzymes: No results for input(s): "CKTOTAL", "CKMB", "CKMBINDEX", "TROPONINI" in the last 168  hours.  HbA1C: No results found for: "HGBA1C"  CBG: No results for input(s): "GLUCAP" in the last 168 hours.  Review of Systems:   10 point review of system taken, please see HPI for positives and negatives.   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   Surgical History:   Past Surgical History:  Procedure Laterality Date   COLONOSCOPY     I & D EXTREMITY  11/10/2011   Procedure: IRRIGATION AND DEBRIDEMENT EXTREMITY;  Surgeon: Kathryne Hitch;  Location: [facility] OR;  Service: Orthopedics;  Laterality: Right;  with volar compartment release   I & D EXTREMITY  12/24/2011   Procedure: IRRIGATION AND DEBRIDEMENT EXTREMITY;  Surgeon: Eldred Manges;  Location: [facility] OR;  Service: Orthopedics;  Laterality: Right;     Social History:   reports that he has been smoking cigarettes and e-cigarettes. He has a 18.00 pack-year smoking history. He has never used smokeless tobacco. He reports current drug use. Frequency: 1.00 time per week. Drugs: Marijuana, Methamphetamines, and Heroin. He reports that he does not drink alcohol.   Family History:  His family history is not on file.   Allergies No Known Allergies   Home Medications  Prior to Admission medications   Medication Sig Start Date End Date Taking? Authorizing Provider  ARIPiprazole (ABILIFY) 10 MG tablet Take 10 mg by mouth daily.   Yes [provider]  FLUoxetine (PROZAC) 40 MG capsule Take 80 mg by mouth daily.   Yes [provider]  hydrOXYzine (ATARAX) 25 MG tablet Take 25 mg by mouth 3 (three) times daily as needed for anxiety.   Yes [provider]     Critical care time: Elizebeth Brooking Teya Otterson ACNP Acute Care Nurse Practitioner [facility] Pulmonary/Critical Care Please consult Amion 06/08/2022, 10:24  AM

## 2022-06-08 NOTE — ED Notes (Signed)
ED TO INPATIENT HANDOFF REPORT  S Name/Age/Gender [name] 41 y.o. male Room/Bed: 037C/037C  Code Status   Code Status: Full Code  Home/SNF/Other Home Patient oriented to: self, place, time, and situation Is this baseline? Yes   Triage Complete: Triage complete  Chief Complaint Left upper lobe pneumonia [J18.9]  Triage Note Pt states that he was recently diagnosed with pneumonia, having SOB for the past week and a half, coughing up green sputum, pt is supposed to be on 3L all the time but does not have oxygen at home.    Allergies No Known Allergies  Level of Care/Admitting Diagnosis ED Disposition     ED Disposition  Admit   Condition  --   Comment  Hospital Area: [facility] [[id number]]  Level of Care: Med-Surg [16]  May admit patient to [facility] or [facility] if equivalent level of care is available:: Yes  Covid Evaluation: Asymptomatic - no recent exposure (last 10 days) testing not required  Diagnosis: Left upper lobe pneumonia [id number]  Admitting Physician: Vianne Bulls [id number]  Attending Physician: Vianne Bulls [id number]  Estimated length of stay: past midnight tomorrow  Certification:: I certify this patient will need inpatient services for at least 2 midnights          B Medical/Surgery History 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    Colitis    Depression    Diverticulitis    Drug abuse ([place])    Hepatitis C    MRSA (methicillin resistant staph aureus) culture positive    Past Surgical History:  Procedure Laterality Date   COLONOSCOPY     I & D EXTREMITY  11/10/2011   Procedure: IRRIGATION AND DEBRIDEMENT EXTREMITY;  Surgeon: Mcarthur Rossetti;  Location: [facility];  Service: Orthopedics;  Laterality: Right;  with volar compartment release   I & D EXTREMITY  12/24/2011   Procedure: IRRIGATION AND DEBRIDEMENT EXTREMITY;  Surgeon: Marybelle Killings;   Location: [facility];  Service: Orthopedics;  Laterality: Right;     A IV Location/Drains/Wounds Patient Lines/Drains/Airways Status     Active Line/Drains/Airways     Name Placement date Placement time Site Days   Peripheral IV 06/08/22 20 G Anterior;Proximal;Right Forearm 06/08/22  0250  Forearm  less than 1   Peripheral IV 06/08/22 20 G Anterior;Left;Proximal Forearm 06/08/22  0306  Forearm  less than 1   Wound 01/29/12 Other (Comment) Arm 01/29/12  0824  Arm  3783            Intake/Output Last 24 hours  Intake/Output Summary (Last 24 hours) at 06/08/2022 1412 Last data filed at 06/08/2022 0940 Gross per 24 hour  Intake 3410 ml  Output --  Net 3410 ml    Labs/Imaging Results for orders placed or performed during the hospital encounter of 06/07/22 (from the past 48 hour(s))  CBC with Differential     Status: Abnormal   Collection Time: 06/07/22 10:03 PM  Result Value Ref Range   WBC 16.2 (H) 4.0 - 10.5 K/uL   RBC 4.03 (L) 4.22 - 5.81 MIL/uL   Hemoglobin 10.8 (L) 13.0 - 17.0 g/dL   HCT 32.2 (L) 39.0 - 52.0 %   MCV 79.9 (L) 80.0 - 100.0 fL   MCH 26.8 26.0 - 34.0 pg   MCHC 33.5 30.0 - 36.0 g/dL   RDW 13.5 11.5 - 15.5 %   Platelets 476 (H)  150 - 400 K/uL   nRBC 0.0 0.0 - 0.2 %   Neutrophils Relative % 90 %   Neutro Abs 14.7 (H) 1.7 - 7.7 K/uL   Lymphocytes Relative 5 %   Lymphs Abs 0.8 0.7 - 4.0 K/uL   Monocytes Relative 4 %   Monocytes Absolute 0.6 0.1 - 1.0 K/uL   Eosinophils Relative 0 %   Eosinophils Absolute 0.0 0.0 - 0.5 K/uL   Basophils Relative 0 %   Basophils Absolute 0.0 0.0 - 0.1 K/uL   Immature Granulocytes 1 %   Abs Immature Granulocytes 0.10 (H) 0.00 - 0.07 K/uL    Comment: Performed at [facility] [address] [place], [place] [address]  Comprehensive metabolic panel     Status: Abnormal   Collection Time: 06/07/22 10:03 PM  Result Value Ref Range   Sodium 134 (L) 135 - 145 mmol/L   Potassium 4.0 3.5 - 5.1 mmol/L   Chloride 97 (L) 98 -  111 mmol/L   CO2 28 22 - 32 mmol/L   Glucose, Bld 82 70 - 99 mg/dL    Comment: Glucose reference range applies only to samples taken after fasting for at least 8 hours.   BUN 31 (H) 6 - 20 mg/dL   Creatinine, Ser 0.71 0.61 - 1.24 mg/dL   Calcium 8.6 (L) 8.9 - 10.3 mg/dL   Total Protein 6.4 (L) 6.5 - 8.1 g/dL   Albumin 2.4 (L) 3.5 - 5.0 g/dL   AST 39 15 - 41 U/L   ALT 34 0 - 44 U/L   Alkaline Phosphatase 116 38 - 126 U/L   Total Bilirubin 0.5 0.3 - 1.2 mg/dL   GFR, Estimated >60 >60 mL/min    Comment: (NOTE) Calculated using the CKD-EPI Creatinine Equation (2021)    Anion gap 9 5 - 15    Comment: Performed at [facility], [place] [address] [place], [place] [address]  Resp Panel by RT-PCR (Flu A&B, Covid) Anterior Nasal Swab     Status: None   Collection Time: 06/08/22  2:40 AM   Specimen: Anterior Nasal Swab  Result Value Ref Range   SARS Coronavirus 2 by RT PCR NEGATIVE NEGATIVE    Comment: (NOTE) SARS-CoV-2 target nucleic acids are NOT DETECTED.  The SARS-CoV-2 RNA is generally detectable in upper respiratory specimens during the acute phase of infection. The lowest concentration of SARS-CoV-2 viral copies this assay can detect is 138 copies/mL. A negative result does not preclude SARS-Cov-2 infection and should not be used as the sole basis for treatment or other patient management decisions. A negative result may occur with  improper specimen collection/handling, submission of specimen other than nasopharyngeal swab, presence of viral mutation(s) within the areas targeted by this assay, and inadequate number of viral copies(<138 copies/mL). A negative result must be combined with clinical observations, patient history, and epidemiological information. The expected result is Negative.  Fact Sheet for Patients:  EntrepreneurPulse.com.au  Fact Sheet for Healthcare Providers:  IncredibleEmployment.be  This test is no t yet approved  or cleared by the [place] FDA and  has been authorized for detection and/or diagnosis of SARS-CoV-2 by FDA under an Emergency Use Authorization (EUA). This EUA will remain  in effect (meaning this test can be used) for the duration of the COVID-19 declaration under Section 564(b)(1) of the Act, 21 U.S.C.section 360bbb-3(b)(1), unless the authorization is terminated  or revoked sooner.       Influenza A by PCR NEGATIVE NEGATIVE  Influenza B by PCR NEGATIVE NEGATIVE    Comment: (NOTE) The Xpert Xpress SARS-CoV-2/FLU/RSV plus assay is intended as an aid in the diagnosis of influenza from Nasopharyngeal swab specimens and should not be used as a sole basis for treatment. Nasal washings and aspirates are unacceptable for Xpert Xpress SARS-CoV-2/FLU/RSV testing.  Fact Sheet for Patients: EntrepreneurPulse.com.au  Fact Sheet for Healthcare Providers: IncredibleEmployment.be  This test is not yet approved or cleared by the [place] FDA and has been authorized for detection and/or diagnosis of SARS-CoV-2 by FDA under an Emergency Use Authorization (EUA). This EUA will remain in effect (meaning this test can be used) for the duration of the COVID-19 declaration under Section 564(b)(1) of the Act, 21 U.S.C. section 360bbb-3(b)(1), unless the authorization is terminated or revoked.  Performed at [facility], [place] [address], [place], [place] [address]   Lactic acid, plasma     Status: None   Collection Time: 06/08/22  2:50 AM  Result Value Ref Range   Lactic Acid, Venous 0.6 0.5 - 1.9 mmol/L    Comment: Performed at [facility] [address] [place], [place] [address]  Blood Culture (routine x 2)     Status: None (Preliminary result)   Collection Time: 06/08/22  2:50 AM   Specimen: BLOOD LEFT FOREARM  Result Value Ref Range   Specimen Description BLOOD LEFT FOREARM    Special Requests      BOTTLES DRAWN AEROBIC AND  ANAEROBIC Blood Culture adequate volume   Culture      NO GROWTH < 12 HOURS Performed at [facility], [place] [address], [place], [place] [address]    Report Status PENDING   Blood Culture (routine x 2)     Status: None (Preliminary result)   Collection Time: 06/08/22  2:50 AM   Specimen: BLOOD RIGHT FOREARM  Result Value Ref Range   Specimen Description BLOOD RIGHT FOREARM    Special Requests      BOTTLES DRAWN AEROBIC AND ANAEROBIC Blood Culture adequate volume   Culture      NO GROWTH < 12 HOURS Performed at [facility], [place] [address] [place], [place] [address]    Report Status PENDING   HIV Antibody (routine testing w rflx)     Status: None   Collection Time: 06/08/22  2:50 AM  Result Value Ref Range   HIV Screen 4th Generation wRfx Non Reactive Non Reactive    Comment: Performed at [facility], [place] [address], [place], [place] [address]  CBC     Status: Abnormal   Collection Time: 06/08/22  5:39 AM  Result Value Ref Range   WBC 13.1 (H) 4.0 - 10.5 K/uL   RBC 3.93 (L) 4.22 - 5.81 MIL/uL   Hemoglobin 10.4 (L) 13.0 - 17.0 g/dL   HCT 31.6 (L) 39.0 - 52.0 %   MCV 80.4 80.0 - 100.0 fL   MCH 26.5 26.0 - 34.0 pg   MCHC 32.9 30.0 - 36.0 g/dL   RDW 13.6 11.5 - 15.5 %   Platelets 440 (H) 150 - 400 K/uL   nRBC 0.0 0.0 - 0.2 %    Comment: Performed at [facility], [place] [address] [place], [place] [id number]  Basic metabolic panel     Status: Abnormal   Collection Time: 06/08/22  5:39 AM  Result Value Ref Range   Sodium 134 (L) 135 - 145 mmol/L   Potassium 5.2 (H) 3.5 -  5.1 mmol/L    Comment: SPECIMEN HEMOLYZED. HEMOLYSIS MAY AFFECT INTEGRITY OF RESULTS. DELTA CHECK NOTED    Chloride 98 98 - 111 mmol/L   CO2 27 22 - 32 mmol/L   Glucose, Bld 77 70 - 99 mg/dL    Comment: Glucose reference range applies only to samples taken after fasting for at least 8 hours.   BUN 30 (H) 6 - 20 mg/dL   Creatinine, Ser 0.24 0.61 - 1.24 mg/dL   Calcium 8.3 (L) 8.9 -  10.3 mg/dL   GFR, Estimated >09 >73 mL/min    Comment: (NOTE) Calculated using the CKD-EPI Creatinine Equation (2021)    Anion gap 9 5 - 15    Comment: Performed at [facility], [address], [place], [place] [address]  Rapid urine drug screen (hospital performed)     Status: Abnormal   Collection Time: 06/08/22  9:57 AM  Result Value Ref Range   Opiates NONE DETECTED NONE DETECTED   Cocaine NONE DETECTED NONE DETECTED   Benzodiazepines NONE DETECTED NONE DETECTED   Amphetamines POSITIVE (A) NONE DETECTED   Tetrahydrocannabinol POSITIVE (A) NONE DETECTED   Barbiturates NONE DETECTED NONE DETECTED    Comment: (NOTE) DRUG SCREEN FOR MEDICAL PURPOSES ONLY.  IF CONFIRMATION IS NEEDED FOR ANY PURPOSE, NOTIFY LAB WITHIN 5 DAYS.  LOWEST DETECTABLE LIMITS FOR URINE DRUG SCREEN Drug Class                     Cutoff (ng/mL) Amphetamine and metabolites    1000 Barbiturate and metabolites    200 Benzodiazepine                 200 Tricyclics and metabolites     300 Opiates and metabolites        300 Cocaine and metabolites        300 THC                            50 Performed at [facility], [address] [place], [place] [address]    DG Chest 1 View  Result Date: 06/07/2022 CLINICAL DATA:  Pneumonia.  Shortness of breath.  Cough. EXAM: CHEST  1 VIEW COMPARISON:  08/01/2011 FINDINGS: Airspace disease in the left upper lobe compatible with pneumonia. No confluent opacity on the right. Heart is normal size. No effusions or acute bony abnormality. IMPRESSION: Left upper lobe pneumonia. Electronically Signed   By: Charlett Nose M.D.   On: 06/07/2022 22:17    Pending Labs Unresulted Labs (From admission, onward)     Start     Ordered   06/09/22 0500  Basic metabolic panel  Tomorrow morning,   R        06/08/22 0856   06/09/22 0500  CBC  Tomorrow morning,   R        06/08/22 0856   06/08/22 0854  Expectorated Sputum Assessment w Gram Stain, Rflx to Resp Cult  (COPD /  Pneumonia / Cellulitis / Lower Extremity Wound)  Once,   R        06/08/22 0856   06/08/22 0854  Legionella Pneumophila Serogp 1 Ur Ag  (COPD / Pneumonia / Cellulitis / Lower Extremity Wound)  Once,   R        06/08/22 0856   06/08/22 0500  Procalcitonin  Daily,   R      06/08/22 0452   06/08/22 0452  Procalcitonin - Baseline  ONCE - URGENT,   URGENT        06/08/22 0452            Vitals/Pain Today's Vitals   06/08/22 1245 06/08/22 1300 06/08/22 1315 06/08/22 1330  BP: (!) 150/98 (!) 142/98 (!) 136/97 136/87  Pulse: 75 72 71 61  Resp: 17 17 20 19   Temp:      TempSrc:      SpO2: 91% 100% 94% 100%  PainSc:        Isolation Precautions No active isolations  Medications Medications  cefTRIAXone (ROCEPHIN) 2 g in sodium chloride 0.9 % 100 mL IVPB (0 g Intravenous Stopped 06/08/22 0346)  azithromycin (ZITHROMAX) 500 mg in sodium chloride 0.9 % 250 mL IVPB (0 mg Intravenous Stopped 06/08/22 0407)  enoxaparin (LOVENOX) injection 40 mg (40 mg Subcutaneous Given 06/08/22 0949)  0.9 %  sodium chloride infusion ( Intravenous New Bag/Given 06/08/22 0948)  acetaminophen (TYLENOL) tablet 650 mg (has no administration in time range)    Or  acetaminophen (TYLENOL) suppository 650 mg (has no administration in time range)  docusate sodium (COLACE) capsule 100 mg (100 mg Oral Given 06/08/22 0949)  polyethylene glycol (MIRALAX / GLYCOLAX) packet 17 g (has no administration in time range)  bisacodyl (DULCOLAX) EC tablet 5 mg (has no administration in time range)  ondansetron (ZOFRAN) tablet 4 mg (has no administration in time range)    Or  ondansetron (ZOFRAN) injection 4 mg (has no administration in time range)  guaiFENesin (MUCINEX) 12 hr tablet 600 mg (has no administration in time range)  hydrALAZINE (APRESOLINE) injection 5 mg (has no administration in time range)  albuterol (PROVENTIL) (2.5 MG/3ML) 0.083% nebulizer solution 2.5 mg (has no administration in time range)  sodium chloride  flush (NS) 0.9 % injection 3 mL (3 mLs Intravenous Given 06/08/22 0949)  ketorolac (TORADOL) 30 MG/ML injection 30 mg (has no administration in time range)  nicotine (NICODERM CQ - dosed in mg/24 hours) patch 21 mg (21 mg Transdermal Patch Applied 06/08/22 0953)  fluconazole (DIFLUCAN) tablet 100 mg (has no administration in time range)  lactated ringers bolus 1,000 mL (0 mLs Intravenous Stopped 06/08/22 0405)    And  lactated ringers bolus 1,000 mL (0 mLs Intravenous Stopped 06/08/22 0524)    And  lactated ringers bolus 250 mL (0 mLs Intravenous Stopped 06/08/22 0407)  ketorolac (TORADOL) 15 MG/ML injection 15 mg (15 mg Intravenous Given 06/08/22 0525)  fluconazole (DIFLUCAN) tablet 200 mg (200 mg Oral Given 06/08/22 1208)    Mobility walks Low fall risk   Focused Assessments Pulmonary Assessment Handoff:  Lung sounds: Bilateral Breath Sounds: Coarse crackles O2 Device: Nasal Cannula O2 Flow Rate (L/min): 3 L/min    R Recommendations: See Admitting Provider Note

## 2022-06-08 NOTE — Progress Notes (Signed)
SLP Cancellation Note  Patient Details Name: [name] MRN: [id number] DOB: July 08, 1981   Cancelled treatment:       Reason Eval/Treat Not Completed: Medical issues which prohibited therapy. Pt declined swallow eval, saying that he needs to have his pysch medications first. Notified RN, who says he is waiting orders from MD. Pt updated and willing to work with SLP "when it's a better time."    Mahala Menghini., M.A. CCC-SLP Acute Rehabilitation Services Office [phone]  Secure chat preferred  06/08/2022, 2:57 PM

## 2022-06-08 NOTE — Progress Notes (Addendum)
Pharmacy Antibiotic Note  [name] is a 41 y.o. male admitted on 06/07/2022.  Pharmacy has been consulted for cefepime dosing for PNA per ID recommendation.   Plan: Start cefepime 2g q8h Monitor renal function, cultures and clinical progression  Follow up MRSA PCR - add vancomycin per discussion with Dr. Drue Second if results positive Continue azithromycin, fluconazole and metronidazole per MD  Height: 5\' 9"  (175.3 cm) Weight: 52 kg (114 lb 10.2 oz) IBW/kg (Calculated) : 70.7  Temp (24hrs), Avg:97.9 F (36.6 C), Min:97.6 F (36.4 C), Max:98.3 F (36.8 C)  Recent Labs  Lab 06/07/22 2203 06/08/22 0250 06/08/22 0539  WBC 16.2*  --  13.1*  CREATININE 0.71  --  0.68  LATICACIDVEN  --  0.6  --     Estimated Creatinine Clearance: 88.5 mL/min (by C-G formula based on SCr of 0.68 mg/dL).    No Known Allergies  Antimicrobials this admission: Ceftriaxone x1 6/16  Azithromycin 6/16 >> Fluconazole 6/16 >> Cefepime 6/16 >>   Dose adjustments this admission:   Microbiology results: 6/16 bcx: ngtd  6/16 respiratory: MRSA PCR:  Thank you for allowing pharmacy to be a part of this patient's care.  7/16, PharmD, BCPS Clinical Pharmacist 06/08/2022 6:08 PM  MRSA neg  06/10/2022, PharmD, BCIDP, AAHIVP, CPP Infectious Disease Pharmacist 06/08/2022 8:58 PM

## 2022-06-08 NOTE — ED Notes (Signed)
Oxygen tan replaced

## 2022-06-09 DIAGNOSIS — R1310 Dysphagia, unspecified: Secondary | ICD-10-CM | POA: Diagnosis not present

## 2022-06-09 DIAGNOSIS — E43 Unspecified severe protein-calorie malnutrition: Secondary | ICD-10-CM | POA: Diagnosis not present

## 2022-06-09 DIAGNOSIS — F1120 Opioid dependence, uncomplicated: Secondary | ICD-10-CM | POA: Diagnosis not present

## 2022-06-09 DIAGNOSIS — J690 Pneumonitis due to inhalation of food and vomit: Secondary | ICD-10-CM | POA: Diagnosis not present

## 2022-06-09 LAB — CBC
HCT: 30.7 % — ABNORMAL LOW (ref 39.0–52.0)
Hemoglobin: 9.7 g/dL — ABNORMAL LOW (ref 13.0–17.0)
MCH: 25.4 pg — ABNORMAL LOW (ref 26.0–34.0)
MCHC: 31.6 g/dL (ref 30.0–36.0)
MCV: 80.4 fL (ref 80.0–100.0)
Platelets: 453 10*3/uL — ABNORMAL HIGH (ref 150–400)
RBC: 3.82 MIL/uL — ABNORMAL LOW (ref 4.22–5.81)
RDW: 13.7 % (ref 11.5–15.5)
WBC: 10.2 10*3/uL (ref 4.0–10.5)
nRBC: 0 % (ref 0.0–0.2)

## 2022-06-09 LAB — BASIC METABOLIC PANEL
Anion gap: 6 (ref 5–15)
BUN: 25 mg/dL — ABNORMAL HIGH (ref 6–20)
CO2: 25 mmol/L (ref 22–32)
Calcium: 7.7 mg/dL — ABNORMAL LOW (ref 8.9–10.3)
Chloride: 101 mmol/L (ref 98–111)
Creatinine, Ser: 0.68 mg/dL (ref 0.61–1.24)
GFR, Estimated: >60 mL/min (ref >60)
Glucose, Bld: 90 mg/dL (ref 70–99)
Potassium: 3.6 mmol/L (ref 3.5–5.1)
Sodium: 132 mmol/L — ABNORMAL LOW (ref 135–145)

## 2022-06-09 LAB — PROCALCITONIN: Procalcitonin: 0.59 ng/mL

## 2022-06-09 MED ORDER — ENSURE ENLIVE PO LIQD
237.0000 mL | Freq: Three times a day (TID) | ORAL | Status: DC
  Administered 2022-06-09 – 2022-06-16 (×14): 237 mL via ORAL
  Filled 2022-06-09: qty 237

## 2022-06-09 MED ORDER — PROSOURCE PLUS PO LIQD
30.0000 mL | Freq: Three times a day (TID) | ORAL | Status: DC
Start: 2022-06-09 — End: 2022-06-15
  Administered 2022-06-09 – 2022-06-15 (×17): 30 mL via ORAL
  Filled 2022-06-09 (×17): qty 30

## 2022-06-09 MED ORDER — ADULT MULTIVITAMIN W/MINERALS CH
1.0000 | ORAL_TABLET | Freq: Every day | ORAL | Status: DC
  Administered 2022-06-09 – 2022-06-16 (×8): 1 via ORAL
  Filled 2022-06-09 (×8): qty 1

## 2022-06-09 NOTE — Hospital Course (Addendum)
41 y.o. 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  pneumonia in April and was hospitalized for almost 2 and half weeks and subsequently was admitted for 4 days again.   On last discharge from the hospital, he was prescribed 3 L of oxygen but he was unable to bring the tank during travel to [place].  He has history of significant weight loss since all this started.  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    Patient's recent admission was for cachexia and failure to thrive in [place], [place] from 05/22/2022 to 05/25/2022. recurrent aspiration pneumonia. 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  He had declined enteral nutrition including PEG tube placement. Patient was then admitted to the hospital for further evaluation and treatment.  During hospitalization, patient has been seen by infectious disease Speech therapy has seen the patient--patient adamantly refuses change in diet  Due to significant malnutrition, PEG tube placed  6/21 IR guided peg placed 6/22 Osmolite per tube initiated  Assessment and plan. Principal Problem:   Left upper lobe pneumonia Active Problems:   Opiate dependence, continuous (HCC)   Dysphagia   Tobacco dependence   Protein-calorie malnutrition, severe (HCC)   Recurrent left upper lobe aspiration pneumonia. 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   Sputum culture with rare gram-positive cocci in chains and rare budding yeast.  Unasyn-->Augmentin until 6/24, ending 10 days course  Rx  Hypoxic respiratory failure secondary to the above Patient desats to the 80s without oxygen and will need this at 3 to 4 L on discharge  Dysphagia  IR guided PEG tube placement. Osmolite started nutritionist overview -Rate currently at 50 TOC consulted to ensure that patient can get the PEG feeds  Oral thrush.  On fluconazole--stop 6/29  Opiate dependence Continue Suboxone as outpatient.  Has 1 month supply.   need outpatient follow-up and has been set up with internal medicine clinic for both outpatient follow-up and Suboxone Rx On amphetamine and THC.  Avoid opiates   Tobacco dependence Continues to smoke and vape.  Continue nicotine patch while in the hospital.  Patient was counseled against it  Severe protein calorie malnutrition Body mass index is 16.93 kg/m.  Present on admission.  Patient has significant malnutrition plan for PEG tube placement.  Patient was encouraged to eat and is on Ensure supplement.

## 2022-06-09 NOTE — Evaluation (Signed)
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    Colitis    Depression    Diverticulitis    Drug abuse (HCC)    Hepatitis C    MRSA (methicillin resistant staph aureus) culture positive    Past Surgical History:  Past Surgical History:  Procedure Laterality Date   COLONOSCOPY     I & D EXTREMITY  11/10/2011   Procedure: IRRIGATION AND DEBRIDEMENT EXTREMITY;  Surgeon: Kathryne Hitch;  Location: [facility] OR;  Service: Orthopedics;  Laterality: Right;  with volar compartment release   I & D EXTREMITY  12/24/2011   Procedure: IRRIGATION AND DEBRIDEMENT EXTREMITY;  Surgeon: Eldred Manges;  Location: [facility] OR;  Service: Orthopedics;  Laterality: Right;   HPI:  Patient is a 41 y.o. male with PMH: polysubstance abuse/opiate dependence on suboxone, active smoker, anxiety, depression, hepatitis C who presented to the hospital with SOB. Patient reported he was hospitalized for 2 1/2 weeks in April with PNA and then again the following month for 4 days. These hospitalizations were when he was living in [place]. Since most recent hospitalization, he is supposed to be on 3L oxygen but he was not able to bring his tank with him during travel so he had not had his oxygen for two days. He has lost 47 pounds since initial hospitalization in April. He has recurrent aspiration PNA and h/o ongoing dysphagia. 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 Patient declined enteral nutrition that was recommended by multiple providers. ENT visit on 02/2022 reported "effacement of his piriform  sinuses" and started him on antibiotics and Decadron to reduce inflammation of his throat and suspecting severe acid reflux.    Assessment / Plan / Recommendation  Clinical Impression  Patient presents with clinical s/s of dysphagia as per this bedside/clinical swallow evaluation. He has h/o dysphagia and he self-reports having had a modified barium swallow test in [place] and when asked the results, just stated that he was told he shoud get a feeding (PEG) tube. At beginning of evaluation patient adamantly refusing feeding tubes and SLP assured him that he was not going to be pressured into getting this. Patient required nursing assistance as he was having emergent need to have a bowel movement. When SLP returned to the room about 10 minutes later, patient then stating that he was thinking he might want to get a feeding tube. He was impulsive with his actions as well as speech, speaking rapidly and responding very rapidly, such that SLP frequently had to ask him to repeat himself. After taking some sips of thin liquids and s/p eating some sweet potato (SLP did not observe him eat this) he had mildly wet sounding voice and intermittent hoarse, non productive cough. When asked if he felt his swallow has changed any since ENT visit in March, he reported "its better". SLP recommending objective swallow study to visualize patient's current swallow function and for any potential swallow safety recommendations. Patient is in agreement with this and SLP will plan to do this beginning of next week (Monday or Tuesday). SLP Visit  Diagnosis: Dysphagia, unspecified (R13.10)    Aspiration Risk  Mild aspiration risk;Moderate aspiration risk    Diet Recommendation Regular;Thin liquid   Liquid Administration via: Cup;Straw Medication Administration: Whole meds with liquid Supervision: Patient able to self feed Compensations: Slow rate;Small sips/bites Postural Changes: Seated upright at 90 degrees;Remain upright  for at least 30 minutes after po intake    Other  Recommendations Oral Care Recommendations: Oral care BID    Recommendations for follow up therapy are one component of a multi-disciplinary discharge planning process, led by the attending physician.  Recommendations may be updated based on patient status, additional functional criteria and insurance authorization.  Follow up Recommendations No SLP follow up      Assistance Recommended at Discharge PRN  Functional Status Assessment Patient has had a recent decline in their functional status and demonstrates the ability to make significant improvements in function in a reasonable and predictable amount of time.  Frequency and Duration min 1 x/week  1 week       Prognosis Prognosis for Safe Diet Advancement: Fair Barriers to Reach Goals: Time post onset;Severity of deficits      Swallow Study   General Date of Onset: 06/08/22 HPI: Patient is a 41 y.o. male with PMH: polysubstance abuse/opiate dependence on suboxone, active smoker, anxiety, depression, hepatitis C who presented to the hospital with SOB. Patient reported he was hospitalized for 2 1/2 weeks in April with PNA and then again the following month for 4 days. These hospitalizations were when he was living in [place]. Since most recent hospitalization, he is supposed to be on 3L oxygen but he was not able to bring his tank with him during travel so he had not had his oxygen for two days. He has lost 47 pounds since initial hospitalization in April. He has recurrent aspiration PNA and h/o ongoing dysphagia. 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 Patient declined enteral nutrition that was recommended by multiple providers. ENT visit on 02/2022 reported "effacement of his piriform sinuses" and started him on antibiotics and Decadron to reduce inflammation of his throat and suspecting severe acid  reflux. Type of Study: Bedside Swallow Evaluation Previous Swallow Assessment: reportedly has been seen for dysphagia in [place] Diet Prior to this Study: Regular;Thin liquids Temperature Spikes Noted: No Respiratory Status: Room air History of Recent Intubation: No Behavior/Cognition: Alert;Cooperative;Pleasant mood;Impulsive;Distractible Oral Cavity Assessment: Within Functional Limits Oral Care Completed by SLP: No Oral Cavity - Dentition: Adequate natural dentition Vision: Functional for self-feeding Self-Feeding Abilities: Able to feed self Patient Positioning: Upright in bed Baseline Vocal Quality: Normal;Wet Volitional Cough: Strong Volitional Swallow: Able to elicit    Oral/Motor/Sensory Function Overall Oral Motor/Sensory Function: Within functional limits   Ice Chips     Thin Liquid Thin Liquid: Impaired Presentation: Self Fed;Cup Pharyngeal  Phase Impairments: Wet Vocal Quality    Nectar Thick     Honey Thick     Puree Puree: Not tested   Solid     Solid: Not tested      Angela Nevin, MA, CCC-SLP Speech Therapy

## 2022-06-09 NOTE — Progress Notes (Signed)
[facility]   Reason for visit: Follow up on pneumonia  Interval History: continued cough.  No positive cultures.  Day 3 total antibiotics  Physical Exam: Constitutional:  Vitals:   06/09/22 0527 06/09/22 0747  BP: (!) 153/90 (!) 134/97  Pulse: (!) 52 (!) 57  Resp: 15   Temp: 97.7 F (36.5 C) (!) 97.5 F (36.4 C)  SpO2: 100% 97%   patient appears in NAD Respiratory: Normal respiratory effort; decreased air entry   Review of Systems: Constitutional: positive for fevers and chills Gastrointestinal: negative for diarrhea  Lab Results  Component Value Date   WBC 10.2 06/09/2022   HGB 9.7 (L) 06/09/2022   HCT 30.7 (L) 06/09/2022   MCV 80.4 06/09/2022   PLT 453 (H) 06/09/2022    Lab Results  Component Value Date   CREATININE 0.68 06/09/2022   BUN 25 (H) 06/09/2022   NA 132 (L) 06/09/2022   K 3.6 06/09/2022   CL 101 06/09/2022   CO2 25 06/09/2022    Lab Results  Component Value Date   ALT 34 06/07/2022   AST 39 06/07/2022   ALKPHOS 116 06/07/2022     Microbiology: Recent Results (from the past 240 hour(s))  Resp Panel by RT-PCR (Flu A&B, Covid) Anterior Nasal Swab     Status: None   Collection Time: 06/08/22  2:40 AM   Specimen: Anterior Nasal Swab  Result Value Ref Range Status   SARS Coronavirus 2 by RT PCR NEGATIVE NEGATIVE Final    Comment: (NOTE) SARS-CoV-2 target nucleic acids are NOT DETECTED.  The SARS-CoV-2 RNA is generally detectable in upper respiratory specimens during the acute phase of infection. The lowest concentration of SARS-CoV-2 viral copies this assay can detect is 138 copies/mL. A negative result does not preclude SARS-Cov-2 infection and should not be used as the sole basis for treatment or other patient management decisions. A negative result may occur with  improper specimen collection/handling, submission of specimen other than nasopharyngeal swab, presence of viral mutation(s) within the areas  targeted by this assay, and inadequate number of viral copies(<138 copies/mL). A negative result must be combined with clinical observations, patient history, and epidemiological information. The expected result is Negative.  Fact Sheet for Patients:  BloggerCourse.com  Fact Sheet for Healthcare Providers:  SeriousBroker.it  This test is no t yet approved or cleared by the [place] FDA and  has been authorized for detection and/or diagnosis of SARS-CoV-2 by FDA under an Emergency Use Authorization (EUA). This EUA will remain  in effect (meaning this test can be used) for the duration of the COVID-19 declaration under Section 564(b)(1) of the Act, 21 U.S.C.section 360bbb-3(b)(1), unless the authorization is terminated  or revoked sooner.       Influenza A by PCR NEGATIVE NEGATIVE Final   Influenza B by PCR NEGATIVE NEGATIVE Final    Comment: (NOTE) The Xpert Xpress SARS-CoV-2/FLU/RSV plus assay is intended as an aid in the diagnosis of influenza from Nasopharyngeal swab specimens and should not be used as a sole basis for treatment. Nasal washings and aspirates are unacceptable for Xpert Xpress SARS-CoV-2/FLU/RSV testing.  Fact Sheet for Patients: BloggerCourse.com  Fact Sheet for Healthcare Providers: SeriousBroker.it  This test is not yet approved or cleared by the [place] FDA and has been authorized for detection and/or diagnosis of SARS-CoV-2 by FDA under an Emergency Use Authorization (EUA). This EUA will remain in effect (meaning this test can be used) for the duration of  the COVID-19 declaration under Section 564(b)(1) of the Act, 21 U.S.C. section 360bbb-3(b)(1), unless the authorization is terminated or revoked.  Performed at [facility], [address] [place], [place] [address]   Blood Culture (routine x 2)     Status: None (Preliminary result)    Collection Time: 06/08/22  2:50 AM   Specimen: BLOOD LEFT FOREARM  Result Value Ref Range Status   Specimen Description BLOOD LEFT FOREARM  Final   Special Requests   Final    BOTTLES DRAWN AEROBIC AND ANAEROBIC Blood Culture adequate volume   Culture   Final    NO GROWTH 1 DAY Performed at [facility], [address] [place], [place] [address]    Report Status PENDING  Incomplete  Blood Culture (routine x 2)     Status: None (Preliminary result)   Collection Time: 06/08/22  2:50 AM   Specimen: BLOOD RIGHT FOREARM  Result Value Ref Range Status   Specimen Description BLOOD RIGHT FOREARM  Final   Special Requests   Final    BOTTLES DRAWN AEROBIC AND ANAEROBIC Blood Culture adequate volume   Culture   Final    NO GROWTH 1 DAY Performed at [facility], [address], [place], [place] [address]    Report Status PENDING  Incomplete  MRSA Next Gen by PCR, Nasal     Status: None   Collection Time: 06/08/22  6:03 PM   Specimen: Nasal Mucosa; Nasal Swab  Result Value Ref Range Status   MRSA by PCR Next Gen NOT DETECTED NOT DETECTED Final    Comment: (NOTE) The GeneXpert MRSA Assay (FDA approved for NASAL specimens only), is one component of a comprehensive MRSA colonization surveillance program. It is not intended to diagnose MRSA infection nor to guide or monitor treatment for MRSA infections. Test performance is not FDA approved in patients less than 76 years old. Performed at [facility], [address], [place], [place] [address]     Impression/Plan:  1. Pneumonia - right upper lobe opacity and has had longstanding pulmonary findings in recent hospitalizations in [place].  MRSA screen negative and now off of vancomycin.  Previous legionella Ag negative so will stop azithromycin.  Continue cefepime and metronidazole Sputum cultures  2.  Dysphagia - to get a swallow study.   3.  History of positive AFB - repeat AFBs negative in [place]. Not c/w  infection.

## 2022-06-09 NOTE — Progress Notes (Signed)
PROGRESS NOTE    [name]  BPZ:[id number] DOB: 1981-10-18 DOA: 06/07/2022 PCP: Pcp, No    Brief Narrative:  [name] is a 41 y.o. male with medical history significant of polysubstance abuse, anxiety, depression, hepatitis C to hospital with cough sputum production and shortness of breath.  Patient reported that he did have pneumonia in April and was hospitalized for almost 2 and half weeks and subsequently was admitted for 4 days again.  This time he presented with left-sided chest pain severe cough greenish-gray sputum production.  On last discharge from the hospital he was prescribed 3 L of oxygen but he was unable to bring the tank during travel to [place].  He has history of significant weight loss since all this started.  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  Patient's recent admission was for cachexia and failure to thrive in [place] from 05/22/2022 to 05/25/2022.  He was admitted for recurrent aspiration pneumonia.  Echocardiogram showed preserved LV ejection fraction.  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  He had declined enteral nutrition including PEG tube placement.  His outpatient medications included as of of 6/16: Suboxone 16 mg qAM, 2 mg qPM, 4 mg qhs,Prozac 80 mg daily,Propranolol 20 mg BID,Abilify 5 mg daily.  Patient was then admitted to hospital for further evaluation and treatment.  Assessment and plan. Principal Problem:   Left upper lobe pneumonia Active Problems:   Opiate dependence, continuous (HCC)   Dysphagia   Tobacco dependence   Protein-calorie malnutrition, severe (HCC)   Recurrent aspiration pneumonia. 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  Has hoarse voice.  Continue antibiotics.  COVID and influenza was negative.  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  Pulmonary following.  Infectious disease has been consulted and recommended sputum culture.  Currently on cefepime azithromycin and metronidazole.  Pending Legionella urinary antigen.  PCR negative.  Initial procalcitonin was 0.5 on admission. Continue Mucinex, albuterol, and focus on symptomatic care including aspiration precautions.  Incentive spirometry.  On normal saline 75 mill per hour.  Dysphagia Patient has refused PEG tube placement in the past or enteral nutrition.  Follow speech therapy evaluation.  Oral thrush.  On fluconazole.  Opiate dependence Suboxone as outpatient.  Has 1 month supply.  Will need outpatient follow-up.  Patient continues to use amphetamine and THC.  We will try to avoid narcotics.  Continue Tylenol,  and Toradol for pain.  Continue Suboxone regimen.   Tobacco dependence Continues to smoke and vape.  Continue nicotine patch.  Severe protein calorie malnutrition Body mass index is 16.93 kg/m.  Present on admission.  Patient has significant muscle mass loss of subcutaneous fat tissue loss and weight loss.  Dietary has been consulted.    DVT prophylaxis: enoxaparin (LOVENOX) injection 40 mg Start: 06/08/22 0900   Code Status:     Code Status: Full Code  Disposition: Home Status is: Inpatient Remains inpatient appropriate because: IV antibiotics, further evaluation, pending clinical improvement,   Family Communication: Communicated with the patient at bedside  Consultants:  Infectious disease Pulmonary  Procedures:  None  Antimicrobials:  Diflucan metronidazole cefepime  Anti-infectives (From admission, onward)    Start     Dose/Rate Route Frequency Ordered Stop   06/09/22 1000  fluconazole (DIFLUCAN) tablet 100 mg  100 mg Oral Daily 06/08/22 1029 03/05/25 0959   06/08/22 1915  metroNIDAZOLE (FLAGYL) IVPB 500 mg        500 mg 100 mL/hr over 60 Minutes Intravenous Every 12 hours  06/08/22 1815     06/08/22 1900  ceFEPIme (MAXIPIME) 2 g in sodium chloride 0.9 % 100 mL IVPB        2 g 200 mL/hr over 30 Minutes Intravenous Every 8 hours 06/08/22 1813     06/08/22 1100  fluconazole (DIFLUCAN) tablet 200 mg        200 mg Oral  Once 06/08/22 1029 06/08/22 1208   06/08/22 0245  cefTRIAXone (ROCEPHIN) 2 g in sodium chloride 0.9 % 100 mL IVPB  Status:  Discontinued        2 g 200 mL/hr over 30 Minutes Intravenous Every 24 hours 06/08/22 0240 06/08/22 1801   06/08/22 0245  azithromycin (ZITHROMAX) 500 mg in sodium chloride 0.9 % 250 mL IVPB        500 mg 250 mL/hr over 60 Minutes Intravenous Every 24 hours 06/08/22 0240 06/13/22 0244        Subjective: Today, patient was seen and examined at bedside.  Complains of cough with mild shortness of breath and greenish productive sputum.  Denies chest pain fever chills or rigor.  Objective: Vitals:   06/09/22 0500 06/09/22 0527 06/09/22 0613 06/09/22 0747  BP:  (!) 153/90  (!) 134/97  Pulse:  (!) 52  (!) 57  Resp:  15    Temp:  97.7 F (36.5 C)  (!) 97.5 F (36.4 C)  TempSrc:    Oral  SpO2:  100%  97%  Weight: 52.2 kg  50.7 kg   Height:        Intake/Output Summary (Last 24 hours) at 06/09/2022 0905 Last data filed at 06/09/2022 0300 Gross per 24 hour  Intake 1678.4 ml  Output 400 ml  Net 1278.4 ml   Filed Weights   06/08/22 1544 06/09/22 0500 06/09/22 0613  Weight: 52 kg 52.2 kg 50.7 kg    Physical Examination: Body mass index is 16.51 kg/m.  General: Cachectic built, not in obvious distress, hoarse voice HENT:   No scleral pallor or icterus noted. Oral mucosa is moist.  Chest:  .  Diminished breath sounds bilaterally.  Coarse breath sounds noted CVS: S1 &S2 heard. No murmur.  Regular rate and rhythm. Abdomen: Soft, nontender, nondistended.  Bowel sounds are heard.   Extremities: No cyanosis, clubbing or edema.  Peripheral pulses are palpable.  Muscle mass of the extremities. Psych: Alert, awake and  oriented, normal mood CNS:  No cranial nerve deficits.  Power equal in all extremities. Skin: Warm and dry.  No rashes noted.  Data Reviewed:   CBC: Recent Labs  Lab 06/07/22 2203 06/08/22 0539 06/09/22 0146  WBC 16.2* 13.1* 10.2  NEUTROABS 14.7*  --   --   HGB 10.8* 10.4* 9.7*  HCT 32.2* 31.6* 30.7*  MCV 79.9* 80.4 80.4  PLT 476* 440* 453*    Basic Metabolic Panel: Recent Labs  Lab 06/07/22 2203 06/08/22 0539 06/09/22 0146  NA 134* 134* 132*  K 4.0 5.2* 3.6  CL 97* 98 101  CO2 28 27 25   GLUCOSE 82 77 90  BUN 31* 30* 25*  CREATININE 0.71 0.68 0.68  CALCIUM 8.6* 8.3* 7.7*    Liver Function Tests: Recent Labs  Lab 06/07/22 2203  AST 39  ALT 34  ALKPHOS 116  BILITOT 0.5  PROT 6.4*  ALBUMIN 2.4*     Radiology Studies: DG Chest 1 View  Result Date: 06/07/2022 CLINICAL DATA:  Pneumonia.  Shortness of breath.  Cough. EXAM: CHEST  1 VIEW COMPARISON:  08/01/2011 FINDINGS: Airspace disease in the left upper lobe compatible with pneumonia. No confluent opacity on the right. Heart is normal size. No effusions or acute bony abnormality. IMPRESSION: Left upper lobe pneumonia. Electronically Signed   By: Charlett Nose M.D.   On: 06/07/2022 22:17      LOS: 1 day    Joycelyn Das, MD [facility] Available via Epic secure chat 7am-7pm After these hours, please refer to coverage provider listed on [email or web address] 06/09/2022, 9:05 AM

## 2022-06-09 NOTE — Plan of Care (Signed)

## 2022-06-09 NOTE — Progress Notes (Signed)
Initial Nutrition Assessment  DOCUMENTATION CODES:   Underweight  INTERVENTION:   -Ensure Enlive po TID, each supplement provides 350 kcal and 20 grams of protein -30 ml Prosource Plus TID, each supplement provides 100 kcals and 15 grams protein -MVI with minerals daily  NUTRITION DIAGNOSIS:   Increased nutrient needs related to chronic illness (hepatitis C) as evidenced by estimated needs.  GOAL:   Patient will meet greater than or equal to 90% of their needs  MONITOR:   PO intake, Supplement acceptance  REASON FOR ASSESSMENT:   Malnutrition Screening Tool, Consult Assessment of nutrition requirement/status, Malnutrition Eval  ASSESSMENT:   Pt with medical history significant of polysubstance abuse, anxiety, depression, hepatitis C presents with cough, sputum production and shortness of breath.  Pt admitted with recurrent aspiration pneumonia.   Reviewed I/O's: +1.3 L x 24 hours and +3.9 L since admission  UOP: 400 ml x 24 hours  Pt unavailable at time of visit. Attempted to speak with pt via call to hospital room phone, however, unable to reach. RD unable to obtain further nutrition-related history or complete nutrition-focused physical exam at this time.    Per H&P, pt has dysphagia secondary to nerve damage from previous retropharyngeal abscess. Pt awaiting SLP evaluation. Pt has refused enteral nutrition and PEG tube in the past.   Pt currently on a regular diet. No meal completion data available to assess at this time.   Reviewed wt hx; pt with distant history of weight loss.   Given pt's underweight status and polysubstance abuse, highly suspect pt with malnutrition, however, unable to identify at this time.   Medications reviewed and include suboxone, colace and diflucan  Labs reviewed: Na: 132.   Diet Order:   Diet Order             Diet regular Room service appropriate? Yes; Fluid consistency: Thin  Diet effective now                    EDUCATION NEEDS:   No education needs have been identified at this time  Skin:  Skin Assessment: Reviewed RN Assessment  Last BM:  06/08/22  Height:   Ht Readings from Last 1 Encounters:  06/08/22 5\' 9"  (1.753 m)    Weight:   Wt Readings from Last 1 Encounters:  06/09/22 50.7 kg    Ideal Body Weight:  72.7 kg  BMI:  Body mass index is 16.51 kg/m.  Estimated Nutritional Needs:   Kcal:  2000-2200  Protein:  110-125 grams  Fluid:  > 2 L    06/11/22, RD, LDN, CDCES Registered Dietitian II Certified Diabetes Care and Education Specialist Please refer to [facility] for RD and/or RD on-call/weekend/after hours pager

## 2022-06-09 NOTE — Progress Notes (Signed)
TR[facility] floor coverage for both [facility] and [facility] (remote) on night of 06/08/22 into morning of 06/09/22:    I was notified by RN of the patient's request that his prn Suboxone be scheduled, with the patient being that he is on Suboxone moving 3 times daily on a scheduled basis as an outpatient.  I conveyed that I felt more comfortable maintaining his home dose of Suboxone along with home frequency, but on a prn as opposed to scheduled basis, with plan for reevaluation by day rounding hospitalist tomorrow (6/17).      Kurt Pigg, DO Hospitalist

## 2022-06-10 DIAGNOSIS — F1120 Opioid dependence, uncomplicated: Secondary | ICD-10-CM | POA: Diagnosis not present

## 2022-06-10 DIAGNOSIS — E43 Unspecified severe protein-calorie malnutrition: Secondary | ICD-10-CM | POA: Diagnosis not present

## 2022-06-10 DIAGNOSIS — J690 Pneumonitis due to inhalation of food and vomit: Secondary | ICD-10-CM | POA: Diagnosis not present

## 2022-06-10 DIAGNOSIS — R1310 Dysphagia, unspecified: Secondary | ICD-10-CM | POA: Diagnosis not present

## 2022-06-10 LAB — BASIC METABOLIC PANEL
Anion gap: 10 (ref 5–15)
BUN: 21 mg/dL — ABNORMAL HIGH (ref 6–20)
CO2: 22 mmol/L (ref 22–32)
Calcium: 8.2 mg/dL — ABNORMAL LOW (ref 8.9–10.3)
Chloride: 103 mmol/L (ref 98–111)
Creatinine, Ser: 0.63 mg/dL (ref 0.61–1.24)
GFR, Estimated: >60 mL/min (ref >60)
Glucose, Bld: 82 mg/dL (ref 70–99)
Potassium: 3.8 mmol/L (ref 3.5–5.1)
Sodium: 135 mmol/L (ref 135–145)

## 2022-06-10 LAB — CBC
HCT: 34.3 % — ABNORMAL LOW (ref 39.0–52.0)
Hemoglobin: 11.3 g/dL — ABNORMAL LOW (ref 13.0–17.0)
MCH: 26.2 pg (ref 26.0–34.0)
MCHC: 32.9 g/dL (ref 30.0–36.0)
MCV: 79.4 fL — ABNORMAL LOW (ref 80.0–100.0)
Platelets: 496 10*3/uL — ABNORMAL HIGH (ref 150–400)
RBC: 4.32 MIL/uL (ref 4.22–5.81)
RDW: 13.4 % (ref 11.5–15.5)
WBC: 11.1 10*3/uL — ABNORMAL HIGH (ref 4.0–10.5)
nRBC: 0 % (ref 0.0–0.2)

## 2022-06-10 LAB — EXPECTORATED SPUTUM ASSESSMENT W GRAM STAIN, RFLX TO RESP C

## 2022-06-10 LAB — MAGNESIUM: Magnesium: 1.8 mg/dL (ref 1.7–2.4)

## 2022-06-10 MED ORDER — LOPERAMIDE HCL 2 MG PO CAPS
2.0000 mg | ORAL_CAPSULE | ORAL | Status: DC | PRN
Start: 2022-06-10 — End: 2022-06-16
  Administered 2022-06-10 – 2022-06-13 (×2): 2 mg via ORAL
  Filled 2022-06-10 (×2): qty 1

## 2022-06-10 NOTE — Plan of Care (Signed)
  Problem: Activity: Goal: Ability to tolerate increased activity will improve Outcome: Progressing   Problem: Education: Goal: Knowledge of General Education information will improve Description: Including pain rating scale, medication(s)/side effects and non-pharmacologic comfort measures Outcome: Progressing   Problem: Pain Managment: Goal: General experience of comfort will improve Outcome: Progressing   Problem: Safety: Goal: Ability to remain free from injury will improve Outcome: Progressing   Problem: Skin Integrity: Goal: Risk for impaired skin integrity will decrease Outcome: Progressing   

## 2022-06-10 NOTE — Progress Notes (Signed)
PROGRESS NOTE    [name]  YNW:[id number] DOB: [birth year] DOA: 06/07/2022 PCP: Pcp, No    Brief Narrative:  [name] is a 41 y.o. male with medical history significant of polysubstance abuse, anxiety, depression, hepatitis C to hospital with cough sputum production and shortness of breath.  Patient reported that he did have pneumonia in April and was hospitalized for almost 2 and half weeks and subsequently was admitted for 4 days again.  This time he presented with left-sided chest pain severe cough greenish-gray sputum production.  On last discharge from the hospital he was prescribed 3 L of oxygen but he was unable to bring the tank during travel to [place].  He has history of significant weight loss since all this started.  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  Patient's recent admission was for cachexia and failure to thrive in [place] from 05/22/2022 to 05/25/2022.  He was admitted for recurrent aspiration pneumonia.  Echocardiogram showed preserved LV ejection fraction.  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  He had declined enteral nutrition including PEG tube placement. Patient was then admitted to hospital for further evaluation and treatment.  Assessment and plan. Principal Problem:   Left upper lobe pneumonia Active Problems:   Opiate dependence, continuous (HCC)   Dysphagia   Tobacco dependence   Protein-calorie malnutrition, severe (HCC)   Recurrent aspiration pneumonia. 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  Continue IV antibiotics.  COVID and influenza was negative.  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  Pulmonary and infectious disease on board.  Pending sputum culture.  Currently on cefepime, azithromycin and  metronidazole.  Streptococcus urinary antigen negative.  Pending Legionella urinary antigen.  PCR negative.  Initial procalcitonin was 0.5 on admission. Continue Mucinex, albuterol, and focus on symptomatic care including aspiration precautions.  Continue incentive spirometry.  On normal saline 75 mill per hour.  Sputum culture pending but communicated with the nursing staff to send the sample   Dysphagia Patient has refused PEG tube placement in the past or enteral nutrition.  Follow speech therapy evaluation.  Oral thrush.  On fluconazole.  Opiate dependence Continue Suboxone as outpatient.  Has 1 month supply.  Will need outpatient follow-up.  Patient continues to use amphetamine and THC.  We will try to avoid narcotics.  Continue Tylenol  and Toradol for pain.  Continue Suboxone regimen.   Tobacco dependence Continues to smoke and vape.  Continue nicotine patch.  Severe protein calorie malnutrition Body mass index is 16.93 kg/m.  Present on admission.  Patient has significant muscle mass loss of subcutaneous fat tissue loss and weight loss.  Dietary has been consulted.    DVT prophylaxis: enoxaparin (LOVENOX) injection 40 mg Start: 06/08/22 0900   Code Status:     Code Status: Full Code  Disposition: Home  Status is: Inpatient  Remains inpatient appropriate because: IV antibiotics, further evaluation, pending clinical improvement,   Family Communication:  Communicated with the patient at bedside  Consultants:  Infectious disease Pulmonary  Procedures:  None  Antimicrobials:  Diflucan,  Metronidazole,  cefepime  Anti-infectives (From admission, onward)    Start     Dose/Rate Route Frequency Ordered Stop   06/09/22 1000  fluconazole (DIFLUCAN) tablet 100 mg        100  mg Oral Daily 06/08/22 1029 03/05/25 0959   06/08/22 1915  metroNIDAZOLE (FLAGYL) IVPB 500 mg        500 mg 100 mL/hr over 60 Minutes Intravenous Every 12 hours 06/08/22 1815     06/08/22 1900   ceFEPIme (MAXIPIME) 2 g in sodium chloride 0.9 % 100 mL IVPB        2 g 200 mL/hr over 30 Minutes Intravenous Every 8 hours 06/08/22 1813     06/08/22 1100  fluconazole (DIFLUCAN) tablet 200 mg        200 mg Oral  Once 06/08/22 1029 06/08/22 1208   06/08/22 0245  cefTRIAXone (ROCEPHIN) 2 g in sodium chloride 0.9 % 100 mL IVPB  Status:  Discontinued        2 g 200 mL/hr over 30 Minutes Intravenous Every 24 hours 06/08/22 0240 06/08/22 1801   06/08/22 0245  azithromycin (ZITHROMAX) 500 mg in sodium chloride 0.9 % 250 mL IVPB  Status:  Discontinued        500 mg 250 mL/hr over 60 Minutes Intravenous Every 24 hours 06/08/22 0240 06/09/22 1525      Subjective: Today, patient was seen and examined at bedside.  Patient still has some cough with grayish sputum production.  Denies any chest pain.    Objective: Vitals:   06/09/22 2113 06/10/22 0300 06/10/22 0500 06/10/22 0937  BP: (!) 134/98 (!) 141/98  124/87  Pulse: 73 62  73  Resp: 18 20  18   Temp: 98.3 F (36.8 C) 98 F (36.7 C)  99 F (37.2 C)  TempSrc: Oral Oral    SpO2: 100% 98%  99%  Weight:   57.1 kg   Height:        Intake/Output Summary (Last 24 hours) at 06/10/2022 1034 Last data filed at 06/10/2022 0850 Gross per 24 hour  Intake 1440 ml  Output 2475 ml  Net -1035 ml   Filed Weights   06/09/22 0500 06/09/22 0613 06/10/22 0500  Weight: 52.2 kg 50.7 kg 57.1 kg    Physical Examination: Body mass index is 18.59 kg/m.   General: Cachectic built, not in obvious distress, hoarse voice HENT:   No scleral pallor or icterus noted. Oral mucosa is moist.  Chest:   Diminished breath sounds bilaterally.  Coarse breath sounds noted CVS: S1 &S2 heard. No murmur.  Regular rate and rhythm. Abdomen: Soft, nontender, nondistended.  Bowel sounds are heard.   Extremities: No cyanosis, clubbing or edema.  Peripheral pulses are palpable.  Wasting of the extremities. Psych: Alert, awake and oriented, normal mood CNS:  No cranial nerve  deficits.  Power equal in all extremities.   Skin: Warm and dry.  No rashes noted.  Data Reviewed:   CBC: Recent Labs  Lab 06/07/22 2203 06/08/22 0539 06/09/22 0146 06/10/22 0121  WBC 16.2* 13.1* 10.2 11.1*  NEUTROABS 14.7*  --   --   --   HGB 10.8* 10.4* 9.7* 11.3*  HCT 32.2* 31.6* 30.7* 34.3*  MCV 79.9* 80.4 80.4 79.4*  PLT 476* 440* 453* 496*    Basic Metabolic Panel: Recent Labs  Lab 06/07/22 2203 06/08/22 0539 06/09/22 0146 06/10/22 0121  NA 134* 134* 132* 135  K 4.0 5.2* 3.6 3.8  CL 97* 98 101 103  CO2 28 27 25 22   GLUCOSE 82 77 90 82  BUN 31* 30* 25* 21*  CREATININE 0.71 0.68 0.68 0.63  CALCIUM 8.6* 8.3* 7.7* 8.2*  MG  --   --   --  1.8    Liver Function Tests: Recent Labs  Lab 06/07/22 2203  AST 39  ALT 34  ALKPHOS 116  BILITOT 0.5  PROT 6.4*  ALBUMIN 2.4*     Radiology Studies: No results found.    LOS: 2 days    Joycelyn Das, MD [facility] Available via Epic secure chat 7am-7pm After these hours, please refer to coverage provider listed on [email or web address] 06/10/2022, 10:34 AM

## 2022-06-11 ENCOUNTER — Inpatient Hospital Stay (HOSPITAL_COMMUNITY): Payer: Self-pay

## 2022-06-11 DIAGNOSIS — F1120 Opioid dependence, uncomplicated: Secondary | ICD-10-CM

## 2022-06-11 DIAGNOSIS — R1310 Dysphagia, unspecified: Secondary | ICD-10-CM

## 2022-06-11 DIAGNOSIS — E43 Unspecified severe protein-calorie malnutrition: Secondary | ICD-10-CM

## 2022-06-11 DIAGNOSIS — F17200 Nicotine dependence, unspecified, uncomplicated: Secondary | ICD-10-CM

## 2022-06-11 LAB — CBC
HCT: 30.7 % — ABNORMAL LOW (ref 39.0–52.0)
Hemoglobin: 10.1 g/dL — ABNORMAL LOW (ref 13.0–17.0)
MCH: 26.1 pg (ref 26.0–34.0)
MCHC: 32.9 g/dL (ref 30.0–36.0)
MCV: 79.3 fL — ABNORMAL LOW (ref 80.0–100.0)
Platelets: 406 10*3/uL — ABNORMAL HIGH (ref 150–400)
RBC: 3.87 MIL/uL — ABNORMAL LOW (ref 4.22–5.81)
RDW: 13.3 % (ref 11.5–15.5)
WBC: 9.2 10*3/uL (ref 4.0–10.5)
nRBC: 0 % (ref 0.0–0.2)

## 2022-06-11 MED ORDER — SODIUM CHLORIDE 0.9 % IV SOLN
3.0000 g | Freq: Four times a day (QID) | INTRAVENOUS | Status: DC
  Administered 2022-06-11 – 2022-06-13 (×9): 3 g via INTRAVENOUS
  Filled 2022-06-11 (×9): qty 8

## 2022-06-11 NOTE — Progress Notes (Signed)
Upon entering room to give pt's his nightly meds, pt was noted to be anxious and quickly placed his back pack on floor. Pt was politely asked to go through his back pack and a electronic cigarette was found. Item was removed from his back pack, and charge nurse was notified. No other prohibited items noted. Pt was educated that it was not safe to have tobacco products while using a nicotine patch r/t adverse effects, and tobacco products were prohibited on campus . Pt was calm and cooperative. Nicotine pen was given to charge nurse.

## 2022-06-11 NOTE — Plan of Care (Signed)
  Problem: Activity: Goal: Ability to tolerate increased activity will improve Outcome: Progressing   Problem: Nutrition: Goal: Adequate nutrition will be maintained Outcome: Progressing   Problem: Coping: Goal: Level of anxiety will decrease Outcome: Progressing   Problem: Safety: Goal: Ability to remain free from injury will improve Outcome: Progressing

## 2022-06-11 NOTE — Progress Notes (Addendum)
PROGRESS NOTE    [name]  CWC:[id number] DOB: 01-17-1981 DOA: 06/07/2022 PCP: Pcp, No    Brief Narrative:  [name] is a 41 y.o. male with medical history significant of polysubstance abuse, anxiety, depression, hepatitis C to hospital with cough sputum production and shortness of breath.  Patient reported that he did have pneumonia in April and was hospitalized for almost 2 and half weeks and subsequently was admitted for 4 days again.  This time, patient presented with left-sided chest pain severe cough greenish-gray sputum production.  On last discharge from the hospital he was prescribed 3 L of oxygen but he was unable to bring the tank during travel to [place].  He has history of significant weight loss since all this started.  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  Patient's recent admission was for cachexia and failure to thrive in [place] from 05/22/2022 to 05/25/2022.  He was admitted for recurrent aspiration pneumonia.  Echocardiogram showed preserved LV ejection fraction.  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  He had declined enteral nutrition including PEG tube placement. Patient was then admitted to the hospital for further evaluation and treatment.  Assessment and plan. Principal Problem:   Left upper lobe pneumonia Active Problems:   Opiate dependence, continuous (HCC)   Dysphagia   Tobacco dependence   Protein-calorie malnutrition, severe (HCC)   Recurrent aspiration pneumonia. 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  Continue IV antibiotics.  COVID and influenza was negative.  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  Pulmonary and infectious disease on board.  Pending sputum culture.  Currently on cefepime, Unasyn.  Streptococcus urinary antigen negative.  Pending Legionella urinary antigen.  PCR negative.  Initial procalcitonin was 0.5 on admission. Continue Mucinex, albuterol, and focus on symptomatic care including aspiration precautions.  Continue incentive spirometry.   Sputum culture with rare budding yeast, gram-positive cocci in chains  Dysphagia Patient has refused PEG tube placement in the past or enteral nutrition.  Speech therapy has seen the patient today.  Patient is now thinking about PEG tube placement and is okay with this.  Communicated with the speech therapy.  We will consult IR for PEG tube placement.  Oral thrush.  On fluconazole.  We will continue.  Opiate dependence Continue Suboxone as outpatient.  Has 1 month supply.  Will need outpatient follow-up.  Patient continues to use amphetamine and THC.  We will try to avoid narcotics.  Continue Tylenol  and Toradol for pain.  Continue Suboxone regimen.   Tobacco dependence Continues to smoke and vape.  Continue nicotine patch.  Severe protein calorie malnutrition Body mass index is 16.93 kg/m.  Present on admission.  Patient has significant muscle mass loss of subcutaneous fat tissue loss and weight loss.  Dietary has been consulted.    DVT prophylaxis: enoxaparin (LOVENOX) injection 40 mg Start: 06/08/22 0900   Code Status:     Code Status: Full Code  Disposition: Home  Status is: Inpatient  Remains inpatient appropriate because: IV antibiotics, pending clinical improvement, ID follow-up,   Family Communication:  Communicated with the patient at bedside  Consultants:  Infectious disease Pulmonary  Procedures:  None  Antimicrobials:  Diflucan, Unasyn   Anti-infectives (From admission, onward)    Start     Dose/Rate Route  Frequency Ordered Stop   06/11/22 1200  Ampicillin-Sulbactam (UNASYN) 3 g in sodium chloride 0.9 % 100 mL IVPB        3 g 200 mL/hr over 30 Minutes Intravenous Every 6 hours 06/11/22 1034      06/09/22 1000  fluconazole (DIFLUCAN) tablet 100 mg        100 mg Oral Daily 06/08/22 1029 06/19/22 0959   06/08/22 1915  metroNIDAZOLE (FLAGYL) IVPB 500 mg  Status:  Discontinued        500 mg 100 mL/hr over 60 Minutes Intravenous Every 12 hours 06/08/22 1815 06/11/22 1034   06/08/22 1900  ceFEPIme (MAXIPIME) 2 g in sodium chloride 0.9 % 100 mL IVPB  Status:  Discontinued        2 g 200 mL/hr over 30 Minutes Intravenous Every 8 hours 06/08/22 1813 06/11/22 1034   06/08/22 1100  fluconazole (DIFLUCAN) tablet 200 mg        200 mg Oral  Once 06/08/22 1029 06/08/22 1208   06/08/22 0245  cefTRIAXone (ROCEPHIN) 2 g in sodium chloride 0.9 % 100 mL IVPB  Status:  Discontinued        2 g 200 mL/hr over 30 Minutes Intravenous Every 24 hours 06/08/22 0240 06/08/22 1801   06/08/22 0245  azithromycin (ZITHROMAX) 500 mg in sodium chloride 0.9 % 250 mL IVPB  Status:  Discontinued        500 mg 250 mL/hr over 60 Minutes Intravenous Every 24 hours 06/08/22 0240 06/09/22 1525      Subjective: Today, patient was seen and examined at bedside.  Has some cough with grayish sputum production.  Denies any chest pain, fevers, chills or rigor.    Objective: Vitals:   06/10/22 2015 06/11/22 0447 06/11/22 0500 06/11/22 0751  BP: (!) 131/98 123/80  115/87  Pulse: 70 60  70  Resp: 16 16  19   Temp: 98.8 F (37.1 C) 97.7 F (36.5 C)  98.3 F (36.8 C)  TempSrc:      SpO2: 100% 96%  100%  Weight:   50.6 kg   Height:        Intake/Output Summary (Last 24 hours) at 06/11/2022 1533 Last data filed at 06/11/2022 1522 Gross per 24 hour  Intake 700 ml  Output 2200 ml  Net -1500 ml   Filed Weights   06/09/22 0613 06/10/22 0500 06/11/22 0500  Weight: 50.7 kg 57.1 kg 50.6 kg    Physical Examination: Body mass index is 16.47 kg/m.   General: Cachectic built, not in obvious distress, hoarse voice HENT:   No scleral pallor or icterus noted. Oral mucosa is moist.  Chest:    Diminished breath sounds  bilaterally.  Coarse breath sounds noted. CVS: S1 &S2 heard. No murmur.  Regular rate and rhythm. Abdomen: Soft, nontender, nondistended.  Bowel sounds are heard.   Extremities: No cyanosis, clubbing or edema.  Peripheral pulses are palpable.  Wasting of the extremities Psych: Alert, awake and oriented, normal mood CNS:  No cranial nerve deficits.  Power equal in all extremities.   Skin: Warm and dry.  No rashes noted.  Data Reviewed:   CBC: Recent Labs  Lab 06/07/22 2203 06/08/22 0539 06/09/22 0146 06/10/22 0121 06/11/22 0047  WBC 16.2* 13.1* 10.2 11.1* 9.2  NEUTROABS 14.7*  --   --   --   --   HGB 10.8* 10.4* 9.7* 11.3* 10.1*  HCT 32.2* 31.6* 30.7* 34.3* 30.7*  MCV 79.9* 80.4 80.4 79.4* 79.3*  PLT 476*  440* 453* 496* 406*    Basic Metabolic Panel: Recent Labs  Lab 06/07/22 2203 06/08/22 0539 06/09/22 0146 06/10/22 0121  NA 134* 134* 132* 135  K 4.0 5.2* 3.6 3.8  CL 97* 98 101 103  CO2 28 27 25 22   GLUCOSE 82 77 90 82  BUN 31* 30* 25* 21*  CREATININE 0.71 0.68 0.68 0.63  CALCIUM 8.6* 8.3* 7.7* 8.2*  MG  --   --   --  1.8    Liver Function Tests: Recent Labs  Lab 06/07/22 2203  AST 39  ALT 34  ALKPHOS 116  BILITOT 0.5  PROT 6.4*  ALBUMIN 2.4*     Radiology Studies: DG Swallowing Func-Speech Pathology  Result Date: 06/11/2022 Table formatting from the original result was not included. 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 male who presented to the ED with cough, sputum production, and shortness of breath. Patient reported on admission that he did have pneumonia in April and was hospitalized for almost 2 and half weeks and subsequently was admitted for 4 days again. On last discharge from the hospital he was prescribed 3 L of oxygen but he was unable to bring the tank during travel [place], [employer].  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 Pt lost 47 pounds since initial hospitalization in April and per January, 2023 notes in Care Everywhere, he had lost 50lbs in the preceding 6 months. Pt has declined enteral nutrition from multiple providers including G-tube placement. Pt s/p I&D of neck abscess on 02/07/22 by ENT in 02/09/22. Laryngoscopy during ENT visit on 02/21/22: Bilaterally effaced pyriform sinuses without masses or lesions. Pt started on antibiotics and Decadron to reduce inflammation of his throat; END suspected that this may be affected by severe acid reflux. MBS 04/17/22 at [facility]: severe to profound oropharyngeal dysphagia with interlabial escape, posterior escape of greater than half of bolus, repetitive/disorganized tongue motion, diffused oral residue , delayed swallow initiation, escape of bolus to nasopharynx, reduced anterior excursion and laryngeal elevation, reduced laryngeal vestibule closure, diminished pharyngeal stripping, and  reduced tongue base retraction. Impairments led to a copious oropharyngeal residue and penetration and aspiration during and after the swallow of all consistencies. Laryngeal sensation was impaired and chin tuck, head tilt, supraglottic swallow, and effortful double swallows were ineffective. PMH: polysubstance abuse/opiate dependence on suboxone, active smoker, anxiety, depression, hepatitis C.  Subjective: cooperative, impulsive  Recommendations for follow up therapy are one component of a multi-disciplinary discharge planning process, led by the attending physician.  Recommendations may be updated based on patient status, additional functional criteria and insurance authorization. Assessment / Plan / Recommendation   06/11/2022  12:57 PM Clinical Impressions Clinical Impression Pt presents with pharyngeal dysphagia characterized by reduction in laryngeal elevation, anterior laryngeal movement, tongue base retraction, and pharyngeal constriction. He demonstratrated limited epiglottic inversion and laryngeal closure, minimal PES distention, posterior pharyngeal wall residue, vallecular residue, and pyriform sinus residue. Penetration (PAS 5) and aspiration (PAS 7,8) were noted with thin liquids, nectar thick liquids, and honey thick liquids. Laryngeal invasion and pharyngeal clearance were reduced, but not eliminated with prompted secondary swallows and use of a chin tuck posture. No functional benefit was noted with head lateralization. With training and visual feedback, a mendelsohn maneuver combined with effortful swallows was most effective in improving laryngeal closure, pharyngeal residue, and reducing aspiration risk with modified consistencies. Still, pt's risk of aspiration during and after deglutition is judged to be moderate-high at this time considering impaired laryngeal closure, and pharyngeal residue. Pt was educated regarding the nature and severity of his dysphagia. However, as he has reportedly  expressed to previous SLPs in [place], he stated, "I'm not going to stop eating; I'm going to keep on eating." Pt has verbalized understanding regarding the potential ramifications of continuing a p.o. diet and not observing compensatory strategies. A dysphagia 2 diet with nectar thick liquids poses reduced risk for dysphagia-related adverse events if swallowing precautions, especially use of the Mendelsohn maneuver, are strictly observed. Pt verbalized agreement with this plan and also expressed to SLP that, considering his continued weight loss, he would now be interested in long-term enteral nutrition (e.g., G-tube) if he can still continue to have a p.o. diet. SLP will follow for dysphagia treatment. SLP Visit Diagnosis Dysphagia, pharyngeal phase (R13.13) Impact on safety and function Moderate aspiration risk     06/11/2022  12:57 PM Treatment Recommendations Treatment Recommendations Therapy as outlined in treatment plan below     06/11/2022  12:57 PM Prognosis Prognosis for Safe Diet Advancement Fair Barriers to Reach Goals Time post onset;Severity of deficits   06/11/2022  12:57 PM Diet Recommendations SLP Diet Recommendations Dysphagia 2 (Fine chop) solids;Nectar thick liquid Liquid Administration via Cup;No straw Medication Administration Crushed with puree Compensations Slow rate;Small sips/bites;Effortful swallow Postural Changes Seated upright at 90 degrees;Remain semi-upright after after feeds/meals (Comment)     06/11/2022  12:57 PM Other Recommendations Oral Care Recommendations Oral care BID Follow Up Recommendations Outpatient SLP Functional Status Assessment Patient has not had a recent decline in their functional status   06/11/2022  12:57 PM Frequency and Duration  Speech Therapy Frequency (ACUTE ONLY) min 2x/week Treatment Duration 2 weeks     06/11/2022  12:57 PM Oral Phase [facility]    06/11/2022  12:57 PM Pharyngeal Phase Pharyngeal Phase Impaired Pharyngeal- Honey Teaspoon Reduced pharyngeal  peristalsis;Reduced epiglottic inversion;Reduced laryngeal elevation;Reduced anterior laryngeal mobility;Reduced airway/laryngeal closure;Reduced tongue base retraction;Trace aspiration;Pharyngeal residue - valleculae;Pharyngeal residue - pyriform;Pharyngeal residue - posterior pharnyx;Penetration/Apiration after swallow Pharyngeal Material enters airway, CONTACTS cords and not ejected out;Material enters airway, passes BELOW cords without attempt by patient to eject out (silent aspiration) Pharyngeal- Honey Cup Reduced pharyngeal peristalsis;Reduced epiglottic inversion;Reduced laryngeal elevation;Reduced anterior laryngeal mobility;Reduced airway/laryngeal closure;Reduced tongue base retraction;Trace aspiration;Pharyngeal residue - valleculae;Pharyngeal residue - pyriform;Pharyngeal residue - posterior pharnyx;Penetration/Apiration after swallow Pharyngeal Material enters airway, CONTACTS cords and not ejected out;Material enters airway, passes  BELOW cords without attempt by patient to eject out (silent aspiration) Pharyngeal- Nectar Cup Reduced pharyngeal peristalsis;Reduced epiglottic inversion;Reduced laryngeal elevation;Reduced anterior laryngeal mobility;Reduced airway/laryngeal closure;Reduced tongue base retraction;Trace aspiration;Pharyngeal residue - valleculae;Pharyngeal residue - pyriform;Pharyngeal residue - posterior pharnyx;Penetration/Apiration after swallow Pharyngeal Material enters airway, passes BELOW cords and not ejected out despite cough attempt by patient;Material enters airway, passes BELOW cords without attempt by patient to eject out (silent aspiration) Pharyngeal- Thin Cup Reduced pharyngeal peristalsis;Reduced epiglottic inversion;Reduced laryngeal elevation;Reduced anterior laryngeal mobility;Reduced airway/laryngeal closure;Reduced tongue base retraction;Pharyngeal residue - valleculae;Pharyngeal residue - pyriform;Pharyngeal residue - posterior pharnyx;Penetration/Apiration after  swallow;Moderate aspiration Pharyngeal Material enters airway, passes BELOW cords and not ejected out despite cough attempt by patient;Material enters airway, passes BELOW cords without attempt by patient to eject out (silent aspiration) Pharyngeal- Puree Reduced pharyngeal peristalsis;Reduced epiglottic inversion;Reduced laryngeal elevation;Reduced anterior laryngeal mobility;Reduced airway/laryngeal closure;Reduced tongue base retraction;Pharyngeal residue - valleculae;Pharyngeal residue - pyriform;Pharyngeal residue - posterior pharnyx Pharyngeal- Mechanical Soft Reduced pharyngeal peristalsis;Reduced epiglottic inversion;Reduced laryngeal elevation;Reduced anterior laryngeal mobility;Reduced airway/laryngeal closure;Reduced tongue base retraction;Pharyngeal residue - valleculae;Pharyngeal residue - pyriform;Pharyngeal residue - posterior pharnyx  Shanika I. Vear Clock, MS, CCC-SLP Acute Rehabilitation Services Office number [phone] Pager [phone] [name] 06/11/2022, 3:26 PM                        LOS: 3 days    Joycelyn Das, MD [facility] Available via Epic secure chat 7am-7pm After these hours, please refer to coverage provider listed on [email or web address] 06/11/2022, 3:33 PM

## 2022-06-11 NOTE — Progress Notes (Signed)
Modified Barium Swallow Progress Note  Patient Details  Name: [name] MRN: [id number] Date of Birth: 1981/03/26  Today's Date: 06/11/2022  Modified Barium Swallow completed.  Full report located under Chart Review in the Imaging Section.  Brief recommendations include the following:  Clinical Impression  Pt presents with pharyngeal dysphagia characterized by reduction in laryngeal elevation, anterior laryngeal movement, tongue base retraction, and pharyngeal constriction. He demonstratrated limited epiglottic inversion and laryngeal closure, minimal PES distention, posterior pharyngeal wall residue, vallecular residue, and pyriform sinus residue. Penetration (PAS 5) and aspiration (PAS 7,8) were noted with thin liquids, nectar thick liquids, and honey thick liquids. Laryngeal invasion and pharyngeal clearance were reduced, but not eliminated with prompted secondary swallows and use of a chin tuck posture. No functional benefit was noted with head lateralization. With training and visual feedback, a mendelsohn maneuver combined with effortful swallows was most effective in improving laryngeal closure, pharyngeal residue, and reducing aspiration risk with modified consistencies. Still, pt's risk of aspiration during and after deglutition is judged to be moderate-high at this time considering impaired laryngeal closure, and pharyngeal residue. Pt was educated regarding the nature and severity of his dysphagia. However, as he has reportedly expressed to previous SLPs in [place], he stated, "I'm not going to stop eating; I'm going to keep on eating." Pt has verbalized understanding regarding the potential ramifications of continuing a p.o. diet and not observing compensatory strategies. A dysphagia 2 diet with nectar thick liquids poses reduced risk for dysphagia-related adverse events if swallowing precautions, especially use of the Mendelsohn maneuver, are strictly observed. Pt verbalized agreement with  this plan and also expressed to SLP that, considering his continued weight loss, he would now be interested in long-term enteral nutrition (e.g., G-tube) if he can still continue to have a p.o. diet. SLP will follow for dysphagia treatment.   Swallow Evaluation Recommendations       SLP Diet Recommendations: Dysphagia 2 (Fine chop) solids;Nectar thick liquid   Liquid Administration via: Cup;No straw   Medication Administration: Crushed with puree   Supervision: Patient able to self feed;Intermittent supervision to cue for compensatory strategies   Compensations: Slow rate;Small sips/bites;Effortful swallow (mendelsohn)   Postural Changes: Seated upright at 90 degrees;Remain semi-upright after after feeds/meals (Comment)   Oral Care Recommendations: Oral care BID       Kurt Skalicky I. Vear Clock, MS, CCC-SLP Acute Rehabilitation Services Office number [phone] Pager [phone]  [name] 06/11/2022,3:10 PM

## 2022-06-11 NOTE — Progress Notes (Signed)
Pharmacy Antibiotic Note  [name] is a 41 y.o. male admitted on 06/07/2022.  Pharmacy has been consulted for Fluconazole for oral thrush.   Plan: Continue Fluconazole 100mg  po daily x 10 days Unasyn per MD Pharmacy will sign off Fluconazole consult as stable renal function and stop date in place.  Please re consult if needed.   Height: 5\' 9"  (175.3 cm) Weight: 50.6 kg (111 lb 8.8 oz) IBW/kg (Calculated) : 70.7  Temp (24hrs), Avg:98.2 F (36.8 C), Min:97.7 F (36.5 C), Max:98.8 F (37.1 C)  Recent Labs  Lab 06/07/22 2203 06/08/22 0250 06/08/22 0539 06/09/22 0146 06/10/22 0121 06/11/22 0047  WBC 16.2*  --  13.1* 10.2 11.1* 9.2  CREATININE 0.71  --  0.68 0.68 0.63  --   LATICACIDVEN  --  0.6  --   --   --   --     Estimated Creatinine Clearance: 86.1 mL/min (by C-G formula based on SCr of 0.63 mg/dL).    No Known Allergies  Antimicrobials this admission: Ceftriaxone x1 6/16  Azithromycin 6/16 >>6/17 Fluconazole 6/16 >> Cefepime 6/16 >> 6/19 Unasyn 6/19 >>  Dose adjustments this admission:   Microbiology results: 6/16 bcx: ngtd  6/16 respiratory: MRSA PCR:  Thank you for allowing pharmacy to be a part of this patient's care.  7/16, PharmD, BCPS, BCCCP Clinical Pharmacist Please refer to [facility] for [facility] Pharmacy numbers 06/11/2022 1:24 PM

## 2022-06-11 NOTE — Progress Notes (Signed)
Speech Language Pathology Treatment: Dysphagia  Patient Details Name: [name] MRN: [id number] DOB: [birth year] Today's Date: 06/11/2022 Time: [id number] SLP Time Calculation (min) (ACUTE ONLY): 15 min  Assessment / Plan / Recommendation Clinical Impression  Pt was seen for dysphagia treatment following reports from pt's RN that the pt was "very upset" about the diet change. Pt presented with a wet vocal quality which was temporarily reduced with prompted coughing. Despite education and verbalized agreement in radiology suite regarding results and recommendations including diet modifications, pt stated that he "needs regular food". Pt was re-educated regarding the results of the modified barium swallow study, the rationale for diet recommendations in reducing aspiration risk, and the need for swallowing precautions. Video recording of the study was used to facilitate education. He verbalized understanding and demonstrated understanding of all areas of education including his elevated risk for dysphagia-related adverse events. Following education, pt stated, "I understand- it's going in my lungs" and that he's been told that the long-term risk is death, but "I need my regular food and liquid, I'll do everything else". Pt verbalized understanding and agreement regarding the need to even more consistently use compensatory strategies if he will be on an unrestricted diet. Pt's diet will be advanced to regular texture solids and thin liquids with known aspiration risk per his request. SLP will continue to follow pt.     HPI HPI: Pt is a 41 y.o. male who presented to the ED with cough, sputum production, and shortness of breath. Patient reported on admission that he did have pneumonia in April and was hospitalized for almost 2 and half weeks and subsequently was admitted for 4 days again. On last discharge from the hospital he was prescribed 3 L of oxygen but he was unable to bring the tank during travel to  [employer], [place].  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 Pt lost 47 pounds since initial hospitalization in April and per January, 2023 notes in Care Everywhere, he had lost 50lbs in the preceding 6 months. Pt has declined enteral nutrition from multiple providers including G-tube placement. Pt s/p I&D of neck abscess on 02/07/22 by ENT in [place]. Laryngoscopy during ENT visit on 02/21/22: Bilaterally effaced pyriform sinuses without masses or lesions. Pt started on antibiotics and Decadron to reduce inflammation of his throat; END suspected that this may be affected by severe acid reflux. MBS 04/17/22 at [facility]: severe to profound oropharyngeal dysphagia with interlabial escape, posterior escape of greater than half of bolus, repetitive/disorganized tongue motion, diffused oral residue , delayed swallow initiation, escape of bolus to nasopharynx, reduced anterior excursion and laryngeal elevation, reduced laryngeal vestibule closure, diminished pharyngeal stripping, and reduced tongue base retraction. Impairments led to a copious oropharyngeal residue and penetration and aspiration during and after the swallow of all consistencies. Laryngeal sensation was impaired and chin tuck, head tilt, supraglottic swallow, and effortful double swallows were ineffective. PMH: polysubstance abuse/opiate dependence on suboxone, active smoker, anxiety, depression, hepatitis C.      SLP Plan         Recommendations for follow up therapy are one component of a multi-disciplinary discharge planning process, led by the attending physician.  Recommendations may be updated based on patient status, additional functional criteria and insurance authorization.    Recommendations  Diet recommendations:  (Pt has requested a regular texture diet and thin liquids with known risk)  Liquids  provided via: Cup;No straw Medication Administration: Crushed with puree Supervision: Full supervision/cueing for compensatory strategies Compensations: Slow rate;Small sips/bites;Effortful swallow (mendelsohn) Postural Changes and/or Swallow Maneuvers: Seated upright 90 degrees                Oral Care Recommendations: Oral care BID Follow Up Recommendations: Outpatient SLP Assistance recommended at discharge: PRN SLP Visit Diagnosis: Dysphagia, pharyngeal phase (R13.13)         Zela Sobieski I. Vear Clock, MS, CCC-SLP Acute Rehabilitation Services Office number [phone] Pager [phone]   [name]  06/11/2022, 4:26 PM

## 2022-06-11 NOTE — Plan of Care (Signed)
  Problem: Activity: Goal: Ability to tolerate increased activity will improve Outcome: Progressing   Problem: Clinical Measurements: Goal: Ability to maintain a body temperature in the normal range will improve Outcome: Progressing   Problem: Respiratory: Goal: Ability to maintain adequate ventilation will improve Outcome: Progressing Goal: Ability to maintain a clear airway will improve Outcome: Progressing   

## 2022-06-11 NOTE — Progress Notes (Signed)
[facility]   Reason for visit: Follow up on pneumonia   Interval History: feels about the same, WBC 9.2, afebrile.   Day 5 total antibiotics  Physical Exam: Constitutional:  Vitals:   06/11/22 0447 06/11/22 0751  BP: 123/80 115/87  Pulse: 60 70  Resp: 16 19  Temp: 97.7 F (36.5 C) 98.3 F (36.8 C)  SpO2: 96% 100%   patient appears in NAD Respiratory: Normal respiratory effort; + rales on left side, clear on right   Review of Systems: Constitutional: negative for fevers and chills  Lab Results  Component Value Date   WBC 9.2 06/11/2022   HGB 10.1 (L) 06/11/2022   HCT 30.7 (L) 06/11/2022   MCV 79.3 (L) 06/11/2022   PLT 406 (H) 06/11/2022    Lab Results  Component Value Date   CREATININE 0.63 06/10/2022   BUN 21 (H) 06/10/2022   NA 135 06/10/2022   K 3.8 06/10/2022   CL 103 06/10/2022   CO2 22 06/10/2022    Lab Results  Component Value Date   ALT 34 06/07/2022   AST 39 06/07/2022   ALKPHOS 116 06/07/2022     Microbiology: Recent Results (from the past 240 hour(s))  Resp Panel by RT-PCR (Flu A&B, Covid) Anterior Nasal Swab     Status: None   Collection Time: 06/08/22  2:40 AM   Specimen: Anterior Nasal Swab  Result Value Ref Range Status   SARS Coronavirus 2 by RT PCR NEGATIVE NEGATIVE Final    Comment: (NOTE) SARS-CoV-2 target nucleic acids are NOT DETECTED.  The SARS-CoV-2 RNA is generally detectable in upper respiratory specimens during the acute phase of infection. The lowest concentration of SARS-CoV-2 viral copies this assay can detect is 138 copies/mL. A negative result does not preclude SARS-Cov-2 infection and should not be used as the sole basis for treatment or other patient management decisions. A negative result may occur with  improper specimen collection/handling, submission of specimen other than nasopharyngeal swab, presence of viral mutation(s) within the areas targeted by this assay, and inadequate  number of viral copies(<138 copies/mL). A negative result must be combined with clinical observations, patient history, and epidemiological information. The expected result is Negative.  Fact Sheet for Patients:  BloggerCourse.com  Fact Sheet for Healthcare Providers:  SeriousBroker.it  This test is no t yet approved or cleared by the [place] FDA and  has been authorized for detection and/or diagnosis of SARS-CoV-2 by FDA under an Emergency Use Authorization (EUA). This EUA will remain  in effect (meaning this test can be used) for the duration of the COVID-19 declaration under Section 564(b)(1) of the Act, 21 U.S.C.section 360bbb-3(b)(1), unless the authorization is terminated  or revoked sooner.       Influenza A by PCR NEGATIVE NEGATIVE Final   Influenza B by PCR NEGATIVE NEGATIVE Final    Comment: (NOTE) The Xpert Xpress SARS-CoV-2/FLU/RSV plus assay is intended as an aid in the diagnosis of influenza from Nasopharyngeal swab specimens and should not be used as a sole basis for treatment. Nasal washings and aspirates are unacceptable for Xpert Xpress SARS-CoV-2/FLU/RSV testing.  Fact Sheet for Patients: BloggerCourse.com  Fact Sheet for Healthcare Providers: SeriousBroker.it  This test is not yet approved or cleared by the [place] FDA and has been authorized for detection and/or diagnosis of SARS-CoV-2 by FDA under an Emergency Use Authorization (EUA). This EUA will remain in effect (meaning this test can be used) for the duration of the  COVID-19 declaration under Section 564(b)(1) of the Act, 21 U.S.C. section 360bbb-3(b)(1), unless the authorization is terminated or revoked.  Performed at [facility], [address] [place], [place] [address]   Blood Culture (routine x 2)     Status: None (Preliminary result)   Collection Time: 06/08/22  2:50 AM    Specimen: BLOOD LEFT FOREARM  Result Value Ref Range Status   Specimen Description BLOOD LEFT FOREARM  Final   Special Requests   Final    BOTTLES DRAWN AEROBIC AND ANAEROBIC Blood Culture adequate volume   Culture   Final    NO GROWTH 3 DAYS Performed at [facility], [address] [place], [place] [address]    Report Status PENDING  Incomplete  Blood Culture (routine x 2)     Status: None (Preliminary result)   Collection Time: 06/08/22  2:50 AM   Specimen: BLOOD RIGHT FOREARM  Result Value Ref Range Status   Specimen Description BLOOD RIGHT FOREARM  Final   Special Requests   Final    BOTTLES DRAWN AEROBIC AND ANAEROBIC Blood Culture adequate volume   Culture   Final    NO GROWTH 3 DAYS Performed at [facility], [address], [place], [place] [address]    Report Status PENDING  Incomplete  Expectorated Sputum Assessment w Gram Stain, Rflx to Resp Cult     Status: None   Collection Time: 06/08/22  8:54 AM   Specimen: Expectorated Sputum  Result Value Ref Range Status   Specimen Description EXPECTORATED SPUTUM  Final   Special Requests NONE  Final   Sputum evaluation   Final    THIS SPECIMEN IS ACCEPTABLE FOR SPUTUM CULTURE Performed at [facility], [address] [place], [place] [address]    Report Status 06/10/2022 FINAL  Final  Culture, Respiratory w Gram Stain     Status: None (Preliminary result)   Collection Time: 06/08/22  8:54 AM  Result Value Ref Range Status   Specimen Description EXPECTORATED SPUTUM  Final   Special Requests NONE Reflexed from [id number]  Final   Gram Stain   Final    RARE WBC PRESENT,BOTH PMN AND MONONUCLEAR RARE GRAM POSITIVE COCCI IN CHAINS IN CLUSTERS RARE BUDDING YEAST SEEN    Culture   Final    CULTURE REINCUBATED FOR BETTER GROWTH Performed at [facility], [address], [place], [place] [address]    Report Status PENDING  Incomplete  MRSA Next Gen by PCR, Nasal     Status: None   Collection Time:  06/08/22  6:03 PM   Specimen: Nasal Mucosa; Nasal Swab  Result Value Ref Range Status   MRSA by PCR Next Gen NOT DETECTED NOT DETECTED Final    Comment: (NOTE) The GeneXpert MRSA Assay (FDA approved for NASAL specimens only), is one component of a comprehensive MRSA colonization surveillance program. It is not intended to diagnose MRSA infection nor to guide or monitor treatment for MRSA infections. Test performance is not FDA approved in patients less than 29 years old. Performed at [facility], [address] [place], [place] [address]     Impression/Plan:  1. Pneumonia - left upper lobe and clinically improving with WBC down to 9.2, breathing better and breathing room air.  Has been on cefepime and metronidazole. Will transition to ampicillin/sulbactam.    2.  Possible aspiration - swallow eval today.     3. Previous AFB positive culture -  repeat cultures reviewed in Care Everywhere from [facility] and smears and cultures negative.  No isolation precautions indicated.

## 2022-06-12 ENCOUNTER — Inpatient Hospital Stay (HOSPITAL_COMMUNITY): Payer: Self-pay

## 2022-06-12 LAB — LEGIONELLA PNEUMOPHILA SEROGP 1 UR AG: L. pneumophila Serogp 1 Ur Ag: NEGATIVE

## 2022-06-12 MED ORDER — CEFAZOLIN SODIUM-DEXTROSE 2-4 GM/100ML-% IV SOLN: 2.0000 g | INTRAVENOUS | Status: AC

## 2022-06-12 NOTE — Progress Notes (Signed)
[facility]   Reason for visit: follow up on pneumonia  Interval History: feels the same, remains afebrile.  Swallow eval noted.  Day 6 total antibiotics  Physical Exam: Constitutional:  Vitals:   06/12/22 0300 06/12/22 0730  BP: 116/80 (!) 136/115  Pulse: 70 70  Resp: 18   Temp: 97.6 F (36.4 C) 98.4 F (36.9 C)  SpO2: 100% 94%  He appears in NAD Respiratory: CTA B; normal respiratory efffort Skin: no rashes   Review of Systems: Constitutional: negative for fever or chills  Lab Results  Component Value Date   WBC 9.2 06/11/2022   HGB 10.1 (L) 06/11/2022   HCT 30.7 (L) 06/11/2022   MCV 79.3 (L) 06/11/2022   PLT 406 (H) 06/11/2022    Lab Results  Component Value Date   CREATININE 0.63 06/10/2022   BUN 21 (H) 06/10/2022   NA 135 06/10/2022   K 3.8 06/10/2022   CL 103 06/10/2022   CO2 22 06/10/2022    Lab Results  Component Value Date   ALT 34 06/07/2022   AST 39 06/07/2022   ALKPHOS 116 06/07/2022     Microbiology: Recent Results (from the past 240 hour(s))  Resp Panel by RT-PCR (Flu A&B, Covid) Anterior Nasal Swab     Status: None   Collection Time: 06/08/22  2:40 AM   Specimen: Anterior Nasal Swab  Result Value Ref Range Status   SARS Coronavirus 2 by RT PCR NEGATIVE NEGATIVE Final    Comment: (NOTE) SARS-CoV-2 target nucleic acids are NOT DETECTED.  The SARS-CoV-2 RNA is generally detectable in upper respiratory specimens during the acute phase of infection. The lowest concentration of SARS-CoV-2 viral copies this assay can detect is 138 copies/mL. A negative result does not preclude SARS-Cov-2 infection and should not be used as the sole basis for treatment or other patient management decisions. A negative result may occur with  improper specimen collection/handling, submission of specimen other than nasopharyngeal swab, presence of viral mutation(s) within the areas targeted by this assay, and inadequate number of  viral copies(<138 copies/mL). A negative result must be combined with clinical observations, patient history, and epidemiological information. The expected result is Negative.  Fact Sheet for Patients:  BloggerCourse.com  Fact Sheet for Healthcare Providers:  SeriousBroker.it  This test is no t yet approved or cleared by the [place] FDA and  has been authorized for detection and/or diagnosis of SARS-CoV-2 by FDA under an Emergency Use Authorization (EUA). This EUA will remain  in effect (meaning this test can be used) for the duration of the COVID-19 declaration under Section 564(b)(1) of the Act, 21 U.S.C.section 360bbb-3(b)(1), unless the authorization is terminated  or revoked sooner.       Influenza A by PCR NEGATIVE NEGATIVE Final   Influenza B by PCR NEGATIVE NEGATIVE Final    Comment: (NOTE) The Xpert Xpress SARS-CoV-2/FLU/RSV plus assay is intended as an aid in the diagnosis of influenza from Nasopharyngeal swab specimens and should not be used as a sole basis for treatment. Nasal washings and aspirates are unacceptable for Xpert Xpress SARS-CoV-2/FLU/RSV testing.  Fact Sheet for Patients: BloggerCourse.com  Fact Sheet for Healthcare Providers: SeriousBroker.it  This test is not yet approved or cleared by the [place] FDA and has been authorized for detection and/or diagnosis of SARS-CoV-2 by FDA under an Emergency Use Authorization (EUA). This EUA will remain in effect (meaning this test can be used) for the duration of the COVID-19 declaration under  Section 564(b)(1) of the Act, 21 U.S.C. section 360bbb-3(b)(1), unless the authorization is terminated or revoked.  Performed at [facility], [address], [place], [place] [address]   Blood Culture (routine x 2)     Status: None (Preliminary result)   Collection Time: 06/08/22  2:50 AM    Specimen: BLOOD LEFT FOREARM  Result Value Ref Range Status   Specimen Description BLOOD LEFT FOREARM  Final   Special Requests   Final    BOTTLES DRAWN AEROBIC AND ANAEROBIC Blood Culture adequate volume   Culture   Final    NO GROWTH 4 DAYS Performed at [facility], [address] [place], [place] [address]    Report Status PENDING  Incomplete  Blood Culture (routine x 2)     Status: None (Preliminary result)   Collection Time: 06/08/22  2:50 AM   Specimen: BLOOD RIGHT FOREARM  Result Value Ref Range Status   Specimen Description BLOOD RIGHT FOREARM  Final   Special Requests   Final    BOTTLES DRAWN AEROBIC AND ANAEROBIC Blood Culture adequate volume   Culture   Final    NO GROWTH 4 DAYS Performed at [facility], [address], [place], [place] [address]    Report Status PENDING  Incomplete  Expectorated Sputum Assessment w Gram Stain, Rflx to Resp Cult     Status: None   Collection Time: 06/08/22  8:54 AM   Specimen: Expectorated Sputum  Result Value Ref Range Status   Specimen Description EXPECTORATED SPUTUM  Final   Special Requests NONE  Final   Sputum evaluation   Final    THIS SPECIMEN IS ACCEPTABLE FOR SPUTUM CULTURE Performed at [facility], [address] [place], [place] [address]    Report Status 06/10/2022 FINAL  Final  Culture, Respiratory w Gram Stain     Status: None (Preliminary result)   Collection Time: 06/08/22  8:54 AM  Result Value Ref Range Status   Specimen Description EXPECTORATED SPUTUM  Final   Special Requests NONE Reflexed from [id number]  Final   Gram Stain   Final    RARE WBC PRESENT,BOTH PMN AND MONONUCLEAR RARE GRAM POSITIVE COCCI IN CHAINS IN CLUSTERS RARE BUDDING YEAST SEEN    Culture   Final    CULTURE REINCUBATED FOR BETTER GROWTH Performed at [facility], [address], [place], [place] [address]    Report Status PENDING  Incomplete  MRSA Next Gen by PCR, Nasal     Status: None   Collection Time:  06/08/22  6:03 PM   Specimen: Nasal Mucosa; Nasal Swab  Result Value Ref Range Status   MRSA by PCR Next Gen NOT DETECTED NOT DETECTED Final    Comment: (NOTE) The GeneXpert MRSA Assay (FDA approved for NASAL specimens only), is one component of a comprehensive MRSA colonization surveillance program. It is not intended to diagnose MRSA infection nor to guide or monitor treatment for MRSA infections. Test performance is not FDA approved in patients less than 4 years old. Performed at [facility], [address] [place], [place] [address]     Impression/Plan:  1. Pneumonia - left upper lobe opacity previously noted.  CT scan abdomen also with lower right lobe opacity.  I suspect related to ongoing aspiration as per findings of the swallow study.   He is on amp/sulbactam and will continue while inpatient.  Can treat for 5 more days and can use amoxicillin/clavulanate  at discharge if he leaves soon.   2.  Aspiration - noted on swallow eval.  He will continue to be at risk of aspirating with a regular diet which he prefers.    3.  Hypoxia - remains on oxygen.  May be related to ongoing aspiration.  Continue to wean per primary team.

## 2022-06-12 NOTE — Consult Note (Signed)
Chief Complaint: Patient was seen in consultation today for g-tube placement at the request of Dr. Birdena Jubilee  Supervising Physician: Mir, Mauri Reading  Patient Status: [facility] - In-pt  History of Present Illness:  [name] is a 41 y.o. male with medical history significant of polysubstance abuse, anxiety, depression, hepatitis C presented to the hospital with cough, sputum production and shortness of breath.  Patient reported that he did have pneumonia in April and was hospitalized for almost 2 and half weeks and subsequently was admitted for 4 days again.   He has history of significant weight loss since all this started.  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  Patient's recent admission was for cachexia and failure to thrive in [place], [place] from 05/22/2022 to 05/25/2022.   He was admitted here for recurrent aspiration pneumonia.  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 IR consulted regarding placement of percutaneous gastrostomy tube.  Subjective:  Patient sitting upright in chair, eating.   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    Colitis    Depression    Diverticulitis    Drug abuse (HCC)    Hepatitis C    MRSA (methicillin resistant staph aureus) culture positive     Past Surgical History:  Procedure Laterality Date   COLONOSCOPY     I & D EXTREMITY  11/10/2011   Procedure: IRRIGATION AND DEBRIDEMENT EXTREMITY;  Surgeon: Kathryne Hitch;  [facility]: [facility];  Service: Orthopedics;  Laterality: Right;  with volar compartment release   I & D EXTREMITY  12/24/2011   Procedure: IRRIGATION AND DEBRIDEMENT EXTREMITY;  Surgeon: Eldred Manges;  Location: [facility] OR;  Service: Orthopedics;  Laterality: Right;    Allergies: Patient has no known  allergies.  Medications: Prior to Admission medications   Medication Sig Start Date End Date Taking? Authorizing Provider  ARIPiprazole (ABILIFY) 10 MG tablet Take 10 mg by mouth daily.   Yes [provider]  FLUoxetine (PROZAC) 40 MG capsule Take 80 mg by mouth daily.   Yes [provider]  hydrOXYzine (ATARAX) 25 MG tablet Take 25 mg by mouth 3 (three) times daily as needed for anxiety.   Yes [provider]     History reviewed. No pertinent family history.  Social History   Socioeconomic History   Marital status: Legally Separated    Spouse name: Not on file   Number of children: Not on file   Years of education: Not on file   Highest education level: Not on file  Occupational History   Occupation: plumbing  Tobacco Use   Smoking status: Every Day    Packs/day: 1.00    Years: 18.00    Total pack years: 18.00    Types: Cigarettes, E-cigarettes   Smokeless tobacco: Never   Tobacco comments:    Requests nicotine patch  Substance and Sexual Activity   Alcohol use: No   Drug use: Yes    Frequency: 1.0 times per week    Types: Marijuana, Methamphetamines, Heroin    Comment: heroin use remotely (last 10 years ago), amphetamines within the month, uses marijuana daily   Sexual activity: Not Currently  Other Topics Concern   Not on file  Social History Narrative   Not on file   Social Determinants  of Health   Financial Resource Strain: Not on file  Food Insecurity: Not on file  Transportation Needs: Not on file  Physical Activity: Not on file  Stress: Not on file  Social Connections: Not on file    Review of Systems: A 12 point ROS discussed and pertinent positives are indicated in the HPI above.  All other systems are negative.  Review of Systems  Constitutional:  Negative for chills and fever.  HENT:  Positive for trouble swallowing and voice change.   Respiratory:  Positive for shortness of breath.   Cardiovascular: Negative.    Gastrointestinal:  Negative for nausea and vomiting.  Allergic/Immunologic: Negative.   Neurological: Negative.   Hematological: Negative.   Psychiatric/Behavioral:  Negative for behavioral problems and confusion.     Vital Signs: BP (!) 136/115   Pulse 70   Temp 98.4 F (36.9 C) (Oral)   Resp 18   Ht  (1.753 m)   Wt 116 lb 2.9 oz (52.7 kg)   SpO2 94%   BMI 17.16 kg/m     Physical Exam Vitals reviewed.  Constitutional:      General: He is not in acute distress.    Appearance: He is cachectic. He is ill-appearing.     Interventions: Nasal cannula in place.  HENT:     Head: Normocephalic and atraumatic.  Eyes:     Extraocular Movements: Extraocular movements intact.     Conjunctiva/sclera: Conjunctivae normal.  Cardiovascular:     Rate and Rhythm: Normal rate.     Pulses: Normal pulses.     Heart sounds: Normal heart sounds.  Pulmonary:     Effort: Pulmonary effort is normal.     Comments: Diminished coarse breath sounds Abdominal:     General: Abdomen is flat.     Palpations: Abdomen is soft.  Skin:    General: Skin is dry.     Coloration: Skin is pale.  Neurological:     General: No focal deficit present.     Mental Status: He is alert and oriented to person, place, and time.  Psychiatric:        Mood and Affect: Mood normal.        Behavior: Behavior normal.     Imaging: CT ABDOMEN WO CONTRAST  Result Date: 06/12/2022 CLINICAL DATA:  Anatomy for possible G tube placement EXAM: CT ABDOMEN WITHOUT CONTRAST TECHNIQUE: Multidetector CT imaging of the abdomen was performed following the standard protocol without IV contrast. RADIATION DOSE REDUCTION: This exam was performed according to the departmental dose-optimization program which includes automated exposure control, adjustment of the mA and/or kV according to patient size and/or use of iterative reconstruction technique. COMPARISON:  February 26, 2012 FINDINGS: Lower chest: There is diffuse opacity with  air bronchograms seen at the right lung base. There are some patchy opacities with reticulonodular interstitial changes seen at the left lung base. No pleural effusion. Hepatobiliary: No focal liver abnormality is seen. Mild to moderate hepatomegaly with the liver measuring 19 cm in the craniocaudad dimension. No gallstones, gallbladder wall thickening, or biliary dilatation. Pancreas: The body and tail of the pancreas has a normal appearance. Evaluation of the pancreas is limited in view of severe loss of the subcutaneous and mesenteric fat and without IV contrast. Spleen: Normal in size without focal abnormality. Adrenals/Urinary Tract: Adrenal glands are not well visualized. Kidneys appear normal, without renal calculi, focal lesion, or hydronephrosis. Stomach/Bowel: Stomach is within normal limits. Appendix is not identified. No evidence of bowel  wall thickening, distention, or inflammatory changes. The transverse colon is located inferior to the stomach. Vascular/Lymphatic: No significant vascular findings are present. No enlarged abdominal or pelvic lymph nodes. Other: Severe loss of the subcutaneous and mesenteric fat. Musculoskeletal: No acute or significant osseous findings. IMPRESSION: There are findings of cachexia seen with severe loss of the subcutaneous and mesenteric fat. Mild hepatomegaly. Body of the stomach is located inferior to the costal margin and transverse colon is located inferior to the stomach. Patient may be a candidate for percutaneous gastrostomy tube insertion. There is diffuse opacity with air bronchograms seen in the right lung base of likely pneumonia. Electronically Signed   By: Marjo Bicker M.D.   On: 06/12/2022 09:13   DG Swallowing Func-Speech Pathology  Result Date: 06/11/2022 Table formatting from the original result was not included. 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   Colitis   Depression   Diverticulitis   Drug abuse (HCC)   Hepatitis C   MRSA (methicillin resistant staph aureus) culture positive  Past Surgical History: Past Surgical History: Procedure Laterality Date  COLONOSCOPY    I & D EXTREMITY  11/10/2011  Procedure: IRRIGATION AND DEBRIDEMENT EXTREMITY;  Surgeon: Kathryne Hitch;  Location: [facility];  Service: Orthopedics;  Laterality: Right;  with volar compartment release  I & D EXTREMITY  12/24/2011  Procedure: IRRIGATION AND DEBRIDEMENT EXTREMITY;  Surgeon: Eldred Manges;  [facility];  Service: Orthopedics;  Laterality: Right; HPI: Pt is a 41 y.o. male who presented to the ED with cough, sputum production, and shortness of breath. Patient reported on admission that he did have pneumonia in April and was hospitalized for almost 2 and half weeks and subsequently was admitted for 4 days again. On last discharge from the hospital he was prescribed 3 L of oxygen but he was unable to bring the tank during travel to [employer], [place].  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 Pt lost 47 pounds since initial hospitalization in April and per January, 2023 notes in Care Everywhere, he had lost 50lbs in the preceding 6 months. Pt has declined enteral nutrition from multiple providers including G-tube placement. Pt s/p I&D of neck abscess on 02/07/22 by ENT in [place]. Laryngoscopy during ENT visit on 02/21/22: Bilaterally effaced pyriform sinuses without masses or lesions. Pt started on antibiotics and Decadron to reduce inflammation of his throat; END suspected that this may  be affected by severe acid reflux. MBS 04/17/22 at [facility]: severe to profound oropharyngeal dysphagia with interlabial escape, posterior escape of greater than half of bolus, repetitive/disorganized tongue motion, diffused oral residue , delayed swallow initiation, escape of bolus to nasopharynx, reduced anterior excursion and laryngeal elevation, reduced laryngeal vestibule closure, diminished pharyngeal stripping, and reduced tongue base retraction. Impairments led to a copious oropharyngeal residue and penetration and aspiration during and after the swallow of all consistencies. Laryngeal sensation was impaired and chin tuck, head tilt, supraglottic swallow, and effortful double swallows were ineffective. PMH: polysubstance abuse/opiate dependence on suboxone, active smoker, anxiety,  depression, hepatitis C.  Subjective: cooperative, impulsive  Recommendations for follow up therapy are one component of a multi-disciplinary discharge planning process, led by the attending physician.  Recommendations may be updated based on patient status, additional functional criteria and insurance authorization. Assessment / Plan / Recommendation   06/11/2022  12:57 PM Clinical Impressions Clinical Impression Pt presents with pharyngeal dysphagia characterized by reduction in laryngeal elevation, anterior laryngeal movement, tongue base retraction, and pharyngeal constriction. He demonstratrated limited epiglottic inversion and laryngeal closure, minimal PES distention, posterior pharyngeal wall residue, vallecular residue, and pyriform sinus residue. Penetration (PAS 5) and aspiration (PAS 7,8) were noted with thin liquids, nectar thick liquids, and honey thick liquids. Laryngeal invasion and pharyngeal clearance were reduced, but not eliminated with prompted secondary swallows and use of a chin tuck posture. No functional benefit was noted with head lateralization. With training and visual feedback, a  mendelsohn maneuver combined with effortful swallows was most effective in improving laryngeal closure, pharyngeal residue, and reducing aspiration risk with modified consistencies. Still, pt's risk of aspiration during and after deglutition is judged to be moderate-high at this time considering impaired laryngeal closure, and pharyngeal residue. Pt was educated regarding the nature and severity of his dysphagia. However, as he has reportedly expressed to previous SLPs in [place], he stated, "I'm not going to stop eating; I'm going to keep on eating." Pt has verbalized understanding regarding the potential ramifications of continuing a p.o. diet and not observing compensatory strategies. A dysphagia 2 diet with nectar thick liquids poses reduced risk for dysphagia-related adverse events if swallowing precautions, especially use of the Mendelsohn maneuver, are strictly observed. Pt verbalized agreement with this plan and also expressed to SLP that, considering his continued weight loss, he would now be interested in long-term enteral nutrition (e.g., G-tube) if he can still continue to have a p.o. diet. SLP will follow for dysphagia treatment. SLP Visit Diagnosis Dysphagia, pharyngeal phase (R13.13) Impact on safety and function Moderate aspiration risk     06/11/2022  12:57 PM Treatment Recommendations Treatment Recommendations Therapy as outlined in treatment plan below     06/11/2022  12:57 PM Prognosis Prognosis for Safe Diet Advancement Fair Barriers to Reach Goals Time post onset;Severity of deficits   06/11/2022  12:57 PM Diet Recommendations SLP Diet Recommendations Dysphagia 2 (Fine chop) solids;Nectar thick liquid Liquid Administration via Cup;No straw Medication Administration Crushed with puree Compensations Slow rate;Small sips/bites;Effortful swallow Postural Changes Seated upright at 90 degrees;Remain semi-upright after after feeds/meals (Comment)     06/11/2022  12:57 PM Other Recommendations Oral Care  Recommendations Oral care BID Follow Up Recommendations Outpatient SLP Functional Status Assessment Patient has not had a recent decline in their functional status   06/11/2022  12:57 PM Frequency and Duration  Speech Therapy Frequency (ACUTE ONLY) min 2x/week Treatment Duration 2 weeks     06/11/2022  12:57 PM Oral Phase Oral Phase [facility]    06/11/2022  12:57 PM Pharyngeal Phase Pharyngeal Phase Impaired Pharyngeal- Honey Teaspoon Reduced pharyngeal peristalsis;Reduced epiglottic inversion;Reduced laryngeal elevation;Reduced anterior laryngeal mobility;Reduced airway/laryngeal closure;Reduced tongue base retraction;Trace aspiration;Pharyngeal residue - valleculae;Pharyngeal residue - pyriform;Pharyngeal residue - posterior pharnyx;Penetration/Apiration after swallow Pharyngeal Material enters airway, CONTACTS cords and not ejected out;Material enters airway, passes BELOW cords without attempt by patient to eject out (silent aspiration) Pharyngeal- Honey Cup Reduced pharyngeal peristalsis;Reduced epiglottic inversion;Reduced laryngeal elevation;Reduced anterior laryngeal mobility;Reduced airway/laryngeal closure;Reduced tongue base retraction;Trace aspiration;Pharyngeal residue - valleculae;Pharyngeal residue - pyriform;Pharyngeal residue - posterior pharnyx;Penetration/Apiration after swallow Pharyngeal Material enters airway, CONTACTS  cords and not ejected out;Material enters airway, passes BELOW cords without attempt by patient to eject out (silent aspiration) Pharyngeal- Nectar Cup Reduced pharyngeal peristalsis;Reduced epiglottic inversion;Reduced laryngeal elevation;Reduced anterior laryngeal mobility;Reduced airway/laryngeal closure;Reduced tongue base retraction;Trace aspiration;Pharyngeal residue - valleculae;Pharyngeal residue - pyriform;Pharyngeal residue - posterior pharnyx;Penetration/Apiration after swallow Pharyngeal Material enters airway, passes BELOW cords and not ejected out despite cough attempt by  patient;Material enters airway, passes BELOW cords without attempt by patient to eject out (silent aspiration) Pharyngeal- Thin Cup Reduced pharyngeal peristalsis;Reduced epiglottic inversion;Reduced laryngeal elevation;Reduced anterior laryngeal mobility;Reduced airway/laryngeal closure;Reduced tongue base retraction;Pharyngeal residue - valleculae;Pharyngeal residue - pyriform;Pharyngeal residue - posterior pharnyx;Penetration/Apiration after swallow;Moderate aspiration Pharyngeal Material enters airway, passes BELOW cords and not ejected out despite cough attempt by patient;Material enters airway, passes BELOW cords without attempt by patient to eject out (silent aspiration) Pharyngeal- Puree Reduced pharyngeal peristalsis;Reduced epiglottic inversion;Reduced laryngeal elevation;Reduced anterior laryngeal mobility;Reduced airway/laryngeal closure;Reduced tongue base retraction;Pharyngeal residue - valleculae;Pharyngeal residue - pyriform;Pharyngeal residue - posterior pharnyx Pharyngeal- Mechanical Soft Reduced pharyngeal peristalsis;Reduced epiglottic inversion;Reduced laryngeal elevation;Reduced anterior laryngeal mobility;Reduced airway/laryngeal closure;Reduced tongue base retraction;Pharyngeal residue - valleculae;Pharyngeal residue - pyriform;Pharyngeal residue - posterior pharnyx  Shanika I. Vear ClockPhillips, MS, CCC-SLP Acute Rehabilitation Services Office number [phone] Pager [phone] Scheryl MartenShanika I Phillips 06/11/2022, 3:26 PM                     DG Chest 1 View  Result Date: 06/07/2022 CLINICAL DATA:  Pneumonia.  Shortness of breath.  Cough. EXAM: CHEST  1 VIEW COMPARISON:  08/01/2011 FINDINGS: Airspace disease in the left upper lobe compatible with pneumonia. No confluent opacity on the right. Heart is normal size. No effusions or acute bony abnormality. IMPRESSION: Left upper lobe pneumonia. Electronically Signed   By: Charlett NoseKevin  Dover M.D.   On: 06/07/2022 22:17    Labs:  CBC: Recent Labs     06/08/22 0539 06/09/22 0146 06/10/22 0121 06/11/22 0047  WBC 13.1* 10.2 11.1* 9.2  HGB 10.4* 9.7* 11.3* 10.1*  HCT 31.6* 30.7* 34.3* 30.7*  PLT 440* 453* 496* 406*    BMP: Recent Labs    06/07/22 2203 06/08/22 0539 06/09/22 0146 06/10/22 0121  NA 134* 134* 132* 135  K 4.0 5.2* 3.6 3.8  CL 97* 98 101 103  CO2 28 27 25 22   GLUCOSE 82 77 90 82  BUN 31* 30* 25* 21*  CALCIUM 8.6* 8.3* 7.7* 8.2*  CREATININE 0.71 0.68 0.68 0.63  GFRNONAA >60 >60 >60 >60    LIVER FUNCTION TESTS: Recent Labs    06/07/22 2203  BILITOT 0.5  AST 39  ALT 34  ALKPHOS 116  PROT 6.4*  ALBUMIN 2.4*    Assessment and Plan:  Malnutrition and underweight --associated with calorie deficit --aspirating with eating/drinking, recurrent aspiration pneumonia,  --history of retrophyarngeal abscess approximately 1 mo ago --INR ordered for am,  NPO at midnight --tentatively plan on percutaneous G-tube 06/13/22 with Dr. Grace IsaacWatts  Risks and benefits image guided gastrostomy tube placement was discussed with the patient including, but not limited to the need for a barium enema during the procedure, bleeding, infection, peritonitis and/or damage to adjacent structures.  All of the patient's questions were answered, patient is agreeable to proceed.  Consent signed and in chart.   Thank you for this interesting consult.  I greatly enjoyed meeting [facility]ier and look forward to participating in their care.  A copy of this report was sent to the requesting provider on this date.  Electronically Signed: Sheliah PlaneHayley Caroline Matters, PA  06/12/2022, 2:08 PM   I spent a total of 40 Minutes in face to face in clinical consultation, greater than 50% of which was counseling/coordinating care for g-tube placement planning

## 2022-06-12 NOTE — TOC Initial Note (Signed)
Transition of Care [facility]) - Initial/Assessment Note    Patient Details  Name: [name] MRN: [id number] Date of Birth: 1981/01/03  [facility] CM/SW Contact:    Epifanio Lesches, RN Phone Number: 06/12/2022, 3:22 PM  Clinical Narrative:     Admitted with recurrent aspiration PNA.            Pt with out of state Medicaid, [place]. States has been in [place] x one week and plans to reside in [place]. States lives with friends. States before moving to [place] was active with [facility] (main#) [phone], ( nurse) [phone]. Pt states will need help with locating Suboxone clinic prior to d/c. NCM called IM Clinic. Clinic scheduled appointment for 06/25/2022 @ 2:45 pm for hospital f/u visit with MD and evaluation for suboxone clinic.  States oxygen dependent however traveled to [facility] without oxygen.  Referral made with Financial Counselor for [facility] Medicaid screening....  Plan: PEG tube placement, 6/21 per IR  TOC team following for needs.  Expected Discharge Plan: Home/Self Care Barriers to Discharge: Continued Medical Work up   Patient Goals and CMS Choice        Expected Discharge Plan and Services Expected Discharge Plan: Home/Self Care   Discharge Planning Services: CM Consult   Living arrangements for the past 2 months: Single Family Home                                      Prior Living Arrangements/Services Living arrangements for the past 2 months: Single Family Home Lives with:: Friends Patient language and need for interpreter reviewed:: Yes Do you feel safe going back to the place where you live?: Yes      Need for Family Participation in Patient Care: Yes (Comment) Care giver support system in place?: No (comment)   Criminal Activity/Legal Involvement Pertinent to Current Situation/Hospitalization: No - Comment as needed  Activities of Daily Living Home Assistive Devices/Equipment: Oxygen ADL Screening (condition  at time of admission) Patient's cognitive ability adequate to safely complete daily activities?: Yes Is the patient deaf or have difficulty hearing?: No Does the patient have difficulty seeing, even when wearing glasses/contacts?: No Does the patient have difficulty concentrating, remembering, or making decisions?: No Patient able to express need for assistance with ADLs?: Yes Does the patient have difficulty dressing or bathing?: No Independently performs ADLs?: Yes (appropriate for developmental age) Does the patient have difficulty walking or climbing stairs?: No Weakness of Legs: None Weakness of Arms/Hands: None  Permission Sought/Granted   Permission granted to share information with : Yes, Verbal Permission Granted  Share Information with NAME: Albin Felling( mom) [phone]           Emotional Assessment Appearance:: Appears stated age Attitude/Demeanor/Rapport: Gracious Affect (typically observed): Accepting Orientation: : Oriented to Self, Oriented to Place, Oriented to  Time, Oriented to Situation Alcohol / Substance Use: Illicit Drugs (methampetamines) Psych Involvement: No (comment)  Admission diagnosis:  Left upper lobe pneumonia [J18.9] Acute respiratory failure with hypoxia (HCC) [J96.01] Community acquired pneumonia of left upper lobe of lung [J18.9] Patient Active Problem List   Diagnosis Date Noted   Left upper lobe pneumonia 06/08/2022   Opiate dependence, continuous (HCC) 06/08/2022   Dysphagia 06/08/2022   Tobacco dependence 06/08/2022   Protein-calorie malnutrition, severe (HCC) 06/08/2022   Cellulitis of leg without foot, right 03/10/2012   Abscess of right forearm 12/24/2011  Class: Hospitalized for   Abscess of forearm, right 11/10/2011   Hepatitis C    Diverticulitis    Colitis    Drug abuse (HCC)    PCP:  Pcp, No Pharmacy:   [id number] - [place], [place] - [address] [place] [address] Phone:  [phone] Fax: [phone]  [id number] - [place], [place] - [address] [place] [address] Phone: [phone] Fax: [phone]     Social Determinants of Health (SDOH) Interventions    Readmission Risk Interventions     No data to display

## 2022-06-12 NOTE — Progress Notes (Signed)
PROGRESS NOTE    Kurt NewportSean Grant  ZOX:[id number] DOB: 13-Mar-1981 DOA: 06/07/2022 PCP: Pcp, No    Brief Narrative:  Kurt NewportSean Grant is a 41 y.o. male with medical history significant of polysubstance abuse, anxiety, depression, hepatitis C presented to the hospital with cough, sputum production and shortness of breath.  Patient reported that he did have pneumonia in April and was hospitalized for almost 2 and half weeks and subsequently was admitted for 4 days again.  This time, patient presented with left-sided chest pain, severe cough, greenish-gray sputum production.  On last discharge from the hospital, he was prescribed 3 L of oxygen but he was unable to bring the tank during travel [place].  He has history of significant weight loss since all this started.  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  Patient's recent admission was for cachexia and failure to thrive in [place] from 05/22/2022 to 05/25/2022.  He was admitted for recurrent aspiration pneumonia.  2 D Echocardiogram showed preserved LV ejection fraction.  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  He had declined enteral nutrition including PEG tube placement. Patient was then admitted to the hospital for further evaluation and treatment.  During hospitalization, patient has been seen by infectious disease.  Speech therapy has seen the patient and patient has risk for aspiration.  Due to significant malnutrition, PEG tube has been considered at this time.  Currently pending IR guided PEG tube placement.  Still on IV Zosyn for possible aspiration pneumonia.  Assessment and plan. Principal Problem:   Left upper lobe pneumonia Active Problems:   Opiate dependence, continuous (HCC)   Dysphagia   Tobacco dependence   Protein-calorie malnutrition, severe (HCC)   Recurrent left upper lobe aspiration  pneumonia. 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  Continue IV antibiotics.  COVID and influenza  negative.  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  Pulmonary and infectious disease on board.  Sputum culture with rare gram-positive cocci in chains and rare budding yeast.  Currently on Unasyn. Streptococcus urinary antigen negative.  Pending Legionella urinary antigen.  MRSA PCR negative.  Initial procalcitonin was 0.5 on admission. Continue Mucinex, albuterol, aspiration precautions, incentive spirometry.  Afebrile with no leukocytosis.  Dysphagia Patient has refused PEG tube placement in the past or enteral nutrition.  Speech therapy has seen the patient in hospitalization and at this time patient wishes to undergo IR guided PEG tube placement.  Oral thrush.  On fluconazole.  We will continue.  Opiate dependence Continue Suboxone as outpatient.  Has 1 month supply.  Will need outpatient follow-up.  Patient continues to use amphetamine and THC.  We will try to avoid narcotics.  Continue Tylenol  and Toradol for pain.  Continue Suboxone regimen.   Tobacco dependence Continues to smoke and vape.  Continue nicotine patch while in the hospital.  Patient was counseled against it  Severe protein calorie malnutrition Body mass index is 16.93 kg/m.  Present on admission.  Patient has significant muscle mass loss of subcutaneous fat tissue loss and weight loss.  Dietary has on board and plan for PEG tube placement.  Patient was encouraged to eat and is on Ensure supplement.    DVT prophylaxis: enoxaparin (LOVENOX) injection 40 mg Start: 06/08/22 0900   Code Status:  Code Status: Full Code  Disposition: Home  Status is: Inpatient  Remains inpatient appropriate because: IV antibiotics, PEG tube placement pending   Family Communication:  Communicated with the patient at  bedside  Consultants:  Infectious disease Pulmonary  Procedures:  None yet  Antimicrobials:  Diflucan, Unasyn   Anti-infectives (From admission, onward)    Start     Dose/Rate Route Frequency Ordered Stop   06/11/22 1200  Ampicillin-Sulbactam (UNASYN) 3 g in sodium chloride 0.9 % 100 mL IVPB        3 g 200 mL/hr over 30 Minutes Intravenous Every 6 hours 06/11/22 1034 06/17/22 2359   06/09/22 1000  fluconazole (DIFLUCAN) tablet 100 mg        100 mg Oral Daily 06/08/22 1029 06/19/22 0959   06/08/22 1915  metroNIDAZOLE (FLAGYL) IVPB 500 mg  Status:  Discontinued        500 mg 100 mL/hr over 60 Minutes Intravenous Every 12 hours 06/08/22 1815 06/11/22 1034   06/08/22 1900  ceFEPIme (MAXIPIME) 2 g in sodium chloride 0.9 % 100 mL IVPB  Status:  Discontinued        2 g 200 mL/hr over 30 Minutes Intravenous Every 8 hours 06/08/22 1813 06/11/22 1034   06/08/22 1100  fluconazole (DIFLUCAN) tablet 200 mg        200 mg Oral  Once 06/08/22 1029 06/08/22 1208   06/08/22 0245  cefTRIAXone (ROCEPHIN) 2 g in sodium chloride 0.9 % 100 mL IVPB  Status:  Discontinued        2 g 200 mL/hr over 30 Minutes Intravenous Every 24 hours 06/08/22 0240 06/08/22 1801   06/08/22 0245  azithromycin (ZITHROMAX) 500 mg in sodium chloride 0.9 % 250 mL IVPB  Status:  Discontinued        500 mg 250 mL/hr over 60 Minutes Intravenous Every 24 hours 06/08/22 0240 06/09/22 1525      Subjective: Today, patient was seen and examined at bedside.  Still continues to complain cough with brownish-grayish sputum production.  Denies any chest pain, nausea, vomiting.  Objective: Vitals:   06/11/22 2016 06/12/22 0212 06/12/22 0300 06/12/22 0730  BP: 132/86  116/80 (!) 136/115  Pulse: 73  70 70  Resp: 20  18   Temp: 97.9 F (36.6 C)  97.6 F (36.4 C) 98.4 F (36.9 C)  TempSrc:    Oral  SpO2: 95%  100% 94%  Weight:  52.7 kg    Height:        Intake/Output Summary (Last 24 hours) at 06/12/2022 1328 Last data  filed at 06/11/2022 1840 Gross per 24 hour  Intake 760 ml  Output 750 ml  Net 10 ml   Filed Weights   06/10/22 0500 06/11/22 0500 06/12/22 0212  Weight: 57.1 kg 50.6 kg 52.7 kg    Physical Examination: Body mass index is 17.16 kg/m.   General: Cachectic built, not in obvious distress, hoarse voice HENT:   No scleral pallor or icterus noted. Oral mucosa is moist.  Chest:    Diminished breath sounds bilaterally.  Coarse breath sounds noted CVS: S1 &S2 heard. No murmur.  Regular rate and rhythm. Abdomen: Soft, nontender, nondistended.  Bowel sounds are heard.   Extremities: No cyanosis, clubbing or edema.  Peripheral pulses are palpable.  Wasting of the extremities Psych: Alert, awake and oriented, normal mood CNS:  No cranial nerve deficits.  Power equal in all extremities.   Skin: Warm and dry.  No rashes noted.   Data  Reviewed:   CBC: Recent Labs  Lab 06/07/22 2203 06/08/22 0539 06/09/22 0146 06/10/22 0121 06/11/22 0047  WBC 16.2* 13.1* 10.2 11.1* 9.2  NEUTROABS 14.7*  --   --   --   --   HGB 10.8* 10.4* 9.7* 11.3* 10.1*  HCT 32.2* 31.6* 30.7* 34.3* 30.7*  MCV 79.9* 80.4 80.4 79.4* 79.3*  PLT 476* 440* 453* 496* 406*    Basic Metabolic Panel: Recent Labs  Lab 06/07/22 2203 06/08/22 0539 06/09/22 0146 06/10/22 0121  NA 134* 134* 132* 135  K 4.0 5.2* 3.6 3.8  CL 97* 98 101 103  CO2 GLUCOSE 82 77 90 82  BUN 31* 30* 25* 21*  CREATININE 0.71 0.68 0.68 0.63  CALCIUM 8.6* 8.3* 7.7* 8.2*  MG  --   --   --  1.8    Liver Function Tests: Recent Labs  Lab 06/07/22 2203  AST 39  ALT 34  ALKPHOS 116  BILITOT 0.5  PROT 6.4*  ALBUMIN 2.4*     Radiology Studies: CT ABDOMEN WO CONTRAST  Result Date: 06/12/2022 CLINICAL DATA:  Anatomy for possible G tube placement EXAM: CT ABDOMEN WITHOUT CONTRAST TECHNIQUE: Multidetector CT imaging of the abdomen was performed following the standard protocol without IV contrast. RADIATION DOSE REDUCTION: This  exam was performed according to the departmental dose-optimization program which includes automated exposure control, adjustment of the mA and/or kV according to patient size and/or use of iterative reconstruction technique. COMPARISON:  February 26, 2012 FINDINGS: Lower chest: There is diffuse opacity with air bronchograms seen at the right lung base. There are some patchy opacities with reticulonodular interstitial changes seen at the left lung base. No pleural effusion. Hepatobiliary: No focal liver abnormality is seen. Mild to moderate hepatomegaly with the liver measuring 19 cm in the craniocaudad dimension. No gallstones, gallbladder wall thickening, or biliary dilatation. Pancreas: The body and tail of the pancreas has a normal appearance. Evaluation of the pancreas is limited in view of severe loss of the subcutaneous and mesenteric fat and without IV contrast. Spleen: Normal in size without focal abnormality. Adrenals/Urinary Tract: Adrenal glands are not well visualized. Kidneys appear normal, without renal calculi, focal lesion, or hydronephrosis. Stomach/Bowel: Stomach is within normal limits. Appendix is not identified. No evidence of bowel wall thickening, distention, or inflammatory changes. The transverse colon is located inferior to the stomach. Vascular/Lymphatic: No significant vascular findings are present. No enlarged abdominal or pelvic lymph nodes. Other: Severe loss of the subcutaneous and mesenteric fat. Musculoskeletal: No acute or significant osseous findings. IMPRESSION: There are findings of cachexia seen with severe loss of the subcutaneous and mesenteric fat. Mild hepatomegaly. Body of the stomach is located inferior to the costal margin and transverse colon is located inferior to the stomach. Patient may be a candidate for percutaneous gastrostomy tube insertion. There is diffuse opacity with air bronchograms seen in the right lung base of likely pneumonia. Electronically Signed   By:  Marjo Bicker M.D.   On: 06/12/2022 09:13   DG Swallowing Func-Speech Pathology  Result Date: 06/11/2022 Table formatting from the original result was not included. 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   Colitis   Depression   Diverticulitis   Drug abuse (HCC)   Hepatitis C   MRSA (methicillin resistant staph aureus) culture positive  Past Surgical History: Past Surgical History: Procedure Laterality Date  COLONOSCOPY    I & D EXTREMITY  11/10/2011  Procedure: IRRIGATION AND DEBRIDEMENT EXTREMITY;  Surgeon: Kathryne Hitch;  Location: [facility] OR;  Service: Orthopedics;  Laterality: Right;  with volar compartment release  I & D EXTREMITY  12/24/2011  Procedure: IRRIGATION AND DEBRIDEMENT EXTREMITY;  Surgeon: Eldred Manges;  Location: [facility] OR;  Service: Orthopedics;  Laterality: Right; HPI: Pt is a 41 y.o. male who presented to the ED with cough, sputum production, and shortness of breath. Patient reported on admission that he did have pneumonia in April and was hospitalized for almost 2 and half weeks and subsequently was admitted for 4 days again. On last discharge from the hospital he was prescribed 3 L of oxygen but he was unable to bring the tank during travel to [place] [employer], [place].  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 Pt lost 47 pounds since initial hospitalization in April and per January, 2023 notes in Care Everywhere, he had lost 50lbs in the preceding 6 months. Pt has declined enteral nutrition from  multiple providers including G-tube placement. Pt s/p I&D of neck abscess on 02/07/22 by ENT in [place]. Laryngoscopy during ENT visit on 02/21/22: Bilaterally effaced pyriform sinuses without masses or lesions. Pt started on antibiotics and Decadron to reduce inflammation of his throat; END suspected that this may be affected by severe acid reflux. MBS 04/17/22 at [facility]: severe to profound oropharyngeal dysphagia with interlabial escape, posterior escape of greater than half of bolus, repetitive/disorganized tongue motion, diffused oral residue , delayed swallow initiation, escape of bolus to nasopharynx, reduced anterior excursion and laryngeal elevation, reduced laryngeal vestibule closure, diminished pharyngeal stripping, and reduced tongue base retraction. Impairments led to a copious oropharyngeal residue and penetration and aspiration during and after the swallow of all consistencies. Laryngeal sensation was impaired and chin tuck, head tilt, supraglottic swallow, and effortful double swallows were ineffective. PMH: polysubstance abuse/opiate dependence on suboxone, active smoker, anxiety, depression, hepatitis C.  Subjective: cooperative, impulsive  Recommendations for follow up therapy are one component of a multi-disciplinary discharge planning process, led by the attending physician.  Recommendations may be updated based on patient status, additional functional criteria and insurance authorization. Assessment / Plan / Recommendation   06/11/2022  12:57 PM Clinical Impressions Clinical Impression Pt presents with pharyngeal dysphagia characterized by reduction in laryngeal elevation, anterior laryngeal movement, tongue base retraction, and pharyngeal constriction. He demonstratrated limited epiglottic inversion and laryngeal closure, minimal PES distention, posterior pharyngeal wall residue, vallecular residue, and pyriform sinus residue. Penetration (PAS 5) and aspiration (PAS 7,8) were  noted with thin liquids, nectar thick liquids, and honey thick liquids. Laryngeal invasion and pharyngeal clearance were reduced, but not eliminated with prompted secondary swallows and use of a chin tuck posture. No functional benefit was noted with head lateralization. With training and visual feedback, a mendelsohn maneuver combined with effortful swallows was most effective in improving laryngeal closure, pharyngeal residue, and reducing aspiration risk with modified consistencies. Still, pt's risk of aspiration during and after deglutition is judged to be moderate-high at this time considering impaired laryngeal closure, and pharyngeal residue. Pt was educated regarding the nature  and severity of his dysphagia. However, as he has reportedly expressed to previous SLPs in [place], he stated, "I'm not going to stop eating; I'm going to keep on eating." Pt has verbalized understanding regarding the potential ramifications of continuing a p.o. diet and not observing compensatory strategies. A dysphagia 2 diet with nectar thick liquids poses reduced risk for dysphagia-related adverse events if swallowing precautions, especially use of the Mendelsohn maneuver, are strictly observed. Pt verbalized agreement with this plan and also expressed to SLP that, considering his continued weight loss, he would now be interested in long-term enteral nutrition (e.g., G-tube) if he can still continue to have a p.o. diet. SLP will follow for dysphagia treatment. SLP Visit Diagnosis Dysphagia, pharyngeal phase (R13.13) Impact on safety and function Moderate aspiration risk     06/11/2022  12:57 PM Treatment Recommendations Treatment Recommendations Therapy as outlined in treatment plan below     06/11/2022  12:57 PM Prognosis Prognosis for Safe Diet Advancement Fair Barriers to Reach Goals Time post onset;Severity of deficits   06/11/2022  12:57 PM Diet Recommendations SLP Diet Recommendations Dysphagia 2 (Fine chop) solids;Nectar thick  liquid Liquid Administration via Cup;No straw Medication Administration Crushed with puree Compensations Slow rate;Small sips/bites;Effortful swallow Postural Changes Seated upright at 90 degrees;Remain semi-upright after after feeds/meals (Comment)     06/11/2022  12:57 PM Other Recommendations Oral Care Recommendations Oral care BID Follow Up Recommendations Outpatient SLP Functional Status Assessment Patient has not had a recent decline in their functional status   06/11/2022  12:57 PM Frequency and Duration  Speech Therapy Frequency (ACUTE ONLY) min 2x/week Treatment Duration 2 weeks     06/11/2022  12:57 PM Oral Phase Oral Phase [facility]    06/11/2022  12:57 PM Pharyngeal Phase Pharyngeal Phase Impaired Pharyngeal- Honey Teaspoon Reduced pharyngeal peristalsis;Reduced epiglottic inversion;Reduced laryngeal elevation;Reduced anterior laryngeal mobility;Reduced airway/laryngeal closure;Reduced tongue base retraction;Trace aspiration;Pharyngeal residue - valleculae;Pharyngeal residue - pyriform;Pharyngeal residue - posterior pharnyx;Penetration/Apiration after swallow Pharyngeal Material enters airway, CONTACTS cords and not ejected out;Material enters airway, passes BELOW cords without attempt by patient to eject out (silent aspiration) Pharyngeal- Honey Cup Reduced pharyngeal peristalsis;Reduced epiglottic inversion;Reduced laryngeal elevation;Reduced anterior laryngeal mobility;Reduced airway/laryngeal closure;Reduced tongue base retraction;Trace aspiration;Pharyngeal residue - valleculae;Pharyngeal residue - pyriform;Pharyngeal residue - posterior pharnyx;Penetration/Apiration after swallow Pharyngeal Material enters airway, CONTACTS cords and not ejected out;Material enters airway, passes BELOW cords without attempt by patient to eject out (silent aspiration) Pharyngeal- Nectar Cup Reduced pharyngeal peristalsis;Reduced epiglottic inversion;Reduced laryngeal elevation;Reduced anterior laryngeal mobility;Reduced  airway/laryngeal closure;Reduced tongue base retraction;Trace aspiration;Pharyngeal residue - valleculae;Pharyngeal residue - pyriform;Pharyngeal residue - posterior pharnyx;Penetration/Apiration after swallow Pharyngeal Material enters airway, passes BELOW cords and not ejected out despite cough attempt by patient;Material enters airway, passes BELOW cords without attempt by patient to eject out (silent aspiration) Pharyngeal- Thin Cup Reduced pharyngeal peristalsis;Reduced epiglottic inversion;Reduced laryngeal elevation;Reduced anterior laryngeal mobility;Reduced airway/laryngeal closure;Reduced tongue base retraction;Pharyngeal residue - valleculae;Pharyngeal residue - pyriform;Pharyngeal residue - posterior pharnyx;Penetration/Apiration after swallow;Moderate aspiration Pharyngeal Material enters airway, passes BELOW cords and not ejected out despite cough attempt by patient;Material enters airway, passes BELOW cords without attempt by patient to eject out (silent aspiration) Pharyngeal- Puree Reduced pharyngeal peristalsis;Reduced epiglottic inversion;Reduced laryngeal elevation;Reduced anterior laryngeal mobility;Reduced airway/laryngeal closure;Reduced tongue base retraction;Pharyngeal residue - valleculae;Pharyngeal residue - pyriform;Pharyngeal residue - posterior pharnyx Pharyngeal- Mechanical Soft Reduced pharyngeal peristalsis;Reduced epiglottic inversion;Reduced laryngeal elevation;Reduced anterior laryngeal mobility;Reduced airway/laryngeal closure;Reduced tongue base retraction;Pharyngeal residue - valleculae;Pharyngeal residue - pyriform;Pharyngeal residue - posterior pharnyx  Shanika I. Vear Clock, MS, CCC-SLP Acute Rehabilitation  Services Office number [phone] Pager [phone] [name] 06/11/2022, 3:26 PM                        LOS: 4 days    Joycelyn Das, MD [facility] Available via Epic secure chat 7am-7pm After these hours, please refer to coverage provider  listed on [email or web address] 06/12/2022, 1:28 PM

## 2022-06-12 NOTE — Progress Notes (Signed)
   06/12/22 1640  Clinical Encounter Type  Visited With Patient not available  Visit Type Initial  Referral From Nurse  Consult/Referral To Chaplain   Chaplain responded to a spiritual consult for prayer and needs assessment. Patient was not available to meet with me at the time I arrived.   Valerie Roys [facility]  [phone]

## 2022-06-13 ENCOUNTER — Inpatient Hospital Stay (HOSPITAL_COMMUNITY): Payer: Self-pay

## 2022-06-13 HISTORY — PX: IR GASTROSTOMY TUBE MOD SED: IMG625

## 2022-06-13 LAB — GLUCOSE, CAPILLARY
Glucose-Capillary: 90 mg/dL (ref 70–99)
Glucose-Capillary: 81 mg/dL (ref 70–99)
Glucose-Capillary: 134 mg/dL — ABNORMAL HIGH (ref 70–99)
Glucose-Capillary: 148 mg/dL — ABNORMAL HIGH (ref 70–99)

## 2022-06-13 LAB — CULTURE, BLOOD (ROUTINE X 2)
Culture: NO GROWTH
Culture: NO GROWTH
Special Requests: ADEQUATE
Special Requests: ADEQUATE

## 2022-06-13 LAB — PROTIME-INR
INR: 1.2 (ref 0.8–1.2)
Prothrombin Time: 14.7 seconds (ref 11.4–15.2)

## 2022-06-13 MED ORDER — FENTANYL CITRATE (PF) 100 MCG/2ML IJ SOLN
INTRAMUSCULAR | Status: AC
  Filled 2022-06-13: qty 4

## 2022-06-13 MED ORDER — MIDAZOLAM HCL 2 MG/2ML IJ SOLN
INTRAMUSCULAR | Status: AC
  Filled 2022-06-13: qty 4

## 2022-06-13 MED ORDER — LIDOCAINE HCL 1 % IJ SOLN
INTRAMUSCULAR | Status: AC
  Filled 2022-06-13: qty 20

## 2022-06-13 MED ORDER — IOHEXOL 300 MG/ML  SOLN
100.0000 mL | Freq: Once | INTRAMUSCULAR | Status: AC | PRN
  Administered 2022-06-13: 20 mL

## 2022-06-13 MED ORDER — CEFAZOLIN SODIUM-DEXTROSE 2-4 GM/100ML-% IV SOLN
INTRAVENOUS | Status: AC
  Administered 2022-06-13: 2 g via INTRAVENOUS
  Filled 2022-06-13: qty 100

## 2022-06-13 MED ORDER — GLUCAGON HCL RDNA (DIAGNOSTIC) 1 MG IJ SOLR
INTRAMUSCULAR | Status: AC
  Filled 2022-06-13: qty 1

## 2022-06-13 MED ORDER — OSMOLITE 1.2 CAL PO LIQD
1000.0000 mL | ORAL | Status: DC
  Administered 2022-06-13: 1000 mL
  Filled 2022-06-13 (×2): qty 1000

## 2022-06-13 MED ORDER — HYDROXYZINE HCL 25 MG PO TABS
25.0000 mg | ORAL_TABLET | Freq: Once | ORAL | Status: AC
  Administered 2022-06-13: 25 mg via ORAL
  Filled 2022-06-13: qty 1

## 2022-06-13 MED ORDER — MIDAZOLAM HCL 2 MG/2ML IJ SOLN
INTRAMUSCULAR | Status: AC | PRN
  Administered 2022-06-13: 1 mg via INTRAVENOUS
  Administered 2022-06-13: .5 mg via INTRAVENOUS
  Administered 2022-06-13: 1 mg via INTRAVENOUS

## 2022-06-13 MED ORDER — FENTANYL CITRATE (PF) 100 MCG/2ML IJ SOLN
INTRAMUSCULAR | Status: AC | PRN
  Administered 2022-06-13: 50 ug via INTRAVENOUS

## 2022-06-13 MED ORDER — AMOXICILLIN-POT CLAVULANATE 875-125 MG PO TABS
1.0000 | ORAL_TABLET | Freq: Two times a day (BID) | ORAL | Status: DC
  Administered 2022-06-13 – 2022-06-15 (×5): 1 via ORAL
  Filled 2022-06-13 (×5): qty 1

## 2022-06-13 NOTE — Progress Notes (Signed)
SLP Cancellation Note  Patient Details Name: [name] MRN: [id number] DOB: [birth year]   Cancelled treatment:       Reason Eval/Treat Not Completed: Patient not medically ready (Pt NPO for G-tube placement. SLP will follow up.)  Naftali Carchi I. Vear Clock, MS, CCC-SLP Acute Rehabilitation Services Office number [phone] Pager [phone]  [name] 06/13/2022, 8:09 AM

## 2022-06-13 NOTE — Progress Notes (Signed)
   06/13/22 1630  Assess: MEWS Score  Temp 98.3 F (36.8 C)  BP (!) 128/92  MAP (mmHg) 103  Pulse Rate 75  Resp (!) 26  Level of Consciousness Alert  SpO2 94 %  O2 Device Nasal Cannula  O2 Flow Rate (L/min) 3 L/min  Assess: MEWS Score  MEWS Temp 0  MEWS Systolic 0  MEWS Pulse 0  MEWS RR 2  MEWS LOC 0  MEWS Score 2  MEWS Score Color Yellow  Assess: if the MEWS score is Yellow or Red  Were vital signs taken at a resting state? Yes  Focused Assessment No change from prior assessment  Does the patient meet 2 or more of the SIRS criteria? No  MEWS guidelines implemented *See Row Information* No, other (Comment) (RR is baseline- 20-28)  Treat  MEWS Interventions Other (Comment) (pt's baseline)  Pain Scale 0-10  Pain Score 3  Pain Type Surgical pain  Pain Location Abdomen  Pain Intervention(s) Refused  Complains of Coughing  Take Vital Signs  Increase Vital Sign Frequency   (n/a; pt's RR 20-28 baseline)  Escalate  MEWS: Escalate Yellow: discuss with charge nurse/RN and consider discussing with provider and RRT  Notify: Charge Nurse/RN  Name of Charge Nurse/RN Notified Sam  Date Charge Nurse/RN Notified 06/13/22  Time Charge Nurse/RN Notified 1748  Notify: Provider  Provider Name/Title Samtani  Date Provider Notified 06/13/22  Time Provider Notified 1200 (was notified earlier about the RR)  Method of Notification Page  Notification Reason Requested by patient/family (pt is anxious; wanted to see MD right away)  Provider response See new orders  Date of Provider Response 06/13/22  Time of Provider Response 1210  Notify: Rapid Response  Name of Rapid Response RN Notified n/a (pt's baseline RR 20-28)  Date Rapid Response Notified  (n/a)  Time Rapid Response Notified  (n/a)  Document  Patient Outcome Other (Comment) (pt is stable.)  Progress note created (see row info) Yes  Assess: SIRS CRITERIA  SIRS Temperature  0  SIRS Pulse 0  SIRS Respirations  1  SIRS  WBC 0  SIRS Score Sum  1

## 2022-06-13 NOTE — Procedures (Signed)
Pre procedure Dx: Dysphagia Post Procedure Dx: Same  Successful fluoroscopic guided insertion of gastrostomy tube.   The gastrostomy tube may be used immediately for medications.   Tube feeds may be initiated in 24 hours as per the primary team.    EBL: Trace  Complications: None immediate  Jay Cline Draheim, MD Pager #: [phone]     

## 2022-06-13 NOTE — Progress Notes (Signed)
PROGRESS NOTE    [name]  YJE:[id number] DOB: 06-17-1981 DOA: 06/07/2022 PCP: Pcp, No    Brief Narrative:  41 y.o. 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  pneumonia in April and was hospitalized for almost 2 and half weeks and subsequently was admitted for 4 days again.   On last discharge from the hospital, he was prescribed 3 L of oxygen but he was unable to bring the tank during travel to [place].  He has history of significant weight loss since all this started.  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    Patient's recent admission was for cachexia and failure to thrive in [place], [place] from 05/22/2022 to 05/25/2022. recurrent aspiration pneumonia. 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  He had declined enteral nutrition including PEG tube placement. Patient was then admitted to the hospital for further evaluation and treatment.  During hospitalization, patient has been seen by infectious disease Speech therapy has seen the patient--patient adamantly refuses change in diet    Due to significant malnutrition, PEG tube has been considered at this time.   6/21 IR guided peg placed  Assessment and plan. Principal Problem:   Left upper lobe pneumonia Active Problems:   Opiate dependence, continuous (HCC)   Dysphagia   Tobacco dependence   Protein-calorie malnutrition, severe (HCC)   Recurrent left upper lobe aspiration pneumonia. 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   Sputum culture with rare  gram-positive cocci in chains and rare budding yeast.  Unasyn-->Augmentin until 6/24, ending 10 days course Rx  Dysphagia  IR guided PEG tube placement. Unclear if will qualify for feeds TOC consulted Can start feed tomorrow  Oral thrush.  On fluconazole--stop 6/29  Opiate dependence Continue Suboxone as outpatient.  Has 1 month supply.   need outpatient follow-up. On amphetamine and THC.  Avoid opiates   Tobacco dependence Continues to smoke and vape.  Continue nicotine patch while in the hospital.  Patient was counseled against it  Severe protein calorie malnutrition Body mass index is 16.93 kg/m.  Present on admission.  Patient has significant muscle mass loss of subcutaneous fat tissue loss and weight loss.  Dietary has on board and plan for PEG tube placement.  Patient was encouraged to eat and is on Ensure supplement.    DVT prophylaxis: enoxaparin (LOVENOX) injection 40 mg Start: 06/08/22 0900   Code Status:     Code Status: Full Code  Disposition: Home  Status is: Inpatient  Remains inpatient appropriate because: IV antibiotics, PEG tube placement pending   Family Communication:  Communicated with the patient at bedside  Consultants:  Infectious disease Pulmonary  Procedures:  None yet  Antimicrobials:  Diflucan, Unasyn   Anti-infectives (From admission, onward)    Start     Dose/Rate Route Frequency Ordered Stop   06/13/22 1515  amoxicillin-clavulanate (AUGMENTIN) 875-125 MG per tablet 1 tablet        1 tablet Oral Every 12 hours 06/13/22 1417     06/13/22 0600  ceFAZolin (ANCEF) IVPB 2g/100 mL premix        2 g 200  mL/hr over 30 Minutes Intravenous To Radiology 06/12/22 1615 06/13/22 1130   06/11/22 1200  Ampicillin-Sulbactam (UNASYN) 3 g in sodium chloride 0.9 % 100 mL IVPB  Status:  Discontinued        3 g 200 mL/hr over 30 Minutes Intravenous Every 6 hours 06/11/22 1034 06/13/22 1417   06/09/22 1000  fluconazole (DIFLUCAN) tablet 100 mg         100 mg Oral Daily 06/08/22 1029 06/19/22 0959   06/08/22 1915  metroNIDAZOLE (FLAGYL) IVPB 500 mg  Status:  Discontinued        500 mg 100 mL/hr over 60 Minutes Intravenous Every 12 hours 06/08/22 1815 06/11/22 1034   06/08/22 1900  ceFEPIme (MAXIPIME) 2 g in sodium chloride 0.9 % 100 mL IVPB  Status:  Discontinued        2 g 200 mL/hr over 30 Minutes Intravenous Every 8 hours 06/08/22 1813 06/11/22 1034   06/08/22 1100  fluconazole (DIFLUCAN) tablet 200 mg        200 mg Oral  Once 06/08/22 1029 06/08/22 1208   06/08/22 0245  cefTRIAXone (ROCEPHIN) 2 g in sodium chloride 0.9 % 100 mL IVPB  Status:  Discontinued        2 g 200 mL/hr over 30 Minutes Intravenous Every 24 hours 06/08/22 0240 06/08/22 1801   06/08/22 0245  azithromycin (ZITHROMAX) 500 mg in sodium chloride 0.9 % 250 mL IVPB  Status:  Discontinued        500 mg 250 mL/hr over 60 Minutes Intravenous Every 24 hours 06/08/22 0240 06/09/22 1525      Subjective:  Adamant about diet No cp fever Wet sounding voice however Nursing concerned earlier about him dropping sats  Objective: Vitals:   06/13/22 1125 06/13/22 1130 06/13/22 1145 06/13/22 1200  BP: 125/89 138/88 128/80 (!) 136/100  Pulse: 78 78 78 70  Resp: (!) 26 (!) 24 (!) 24   Temp:    98 F (36.7 C)  TempSrc:    Oral  SpO2: 100% 96% 98% (!) 89%  Weight:      Height:        Intake/Output Summary (Last 24 hours) at 06/13/2022 1418 Last data filed at 06/13/2022 0640 Gross per 24 hour  Intake 2428.54 ml  Output 1625 ml  Net 803.54 ml    Filed Weights   06/10/22 0500 06/11/22 0500 06/12/22 0212  Weight: 57.1 kg 50.6 kg 52.7 kg    Physical Examination: Body mass index is 17.16 kg/m.   Awake coherent animated  No distress Now on 2 l oxygen Cta b no rales no rhonchi no wheeze Abd soft  No le edema   Data Reviewed:   CBC: Recent Labs  Lab 06/07/22 2203 06/08/22 0539 06/09/22 0146 06/10/22 0121 06/11/22 0047  WBC 16.2* 13.1* 10.2 11.1* 9.2   NEUTROABS 14.7*  --   --   --   --   HGB 10.8* 10.4* 9.7* 11.3* 10.1*  HCT 32.2* 31.6* 30.7* 34.3* 30.7*  MCV 79.9* 80.4 80.4 79.4* 79.3*  PLT 476* 440* 453* 496* 406*     Basic Metabolic Panel: Recent Labs  Lab 06/07/22 2203 06/08/22 0539 06/09/22 0146 06/10/22 0121  NA 134* 134* 132* 135  K 4.0 5.2* 3.6 3.8  CL 97* 98 101 103  CO2 28 27 25 22   GLUCOSE 82 77 90 82  BUN 31* 30* 25* 21*  CREATININE 0.71 0.68 0.68 0.63  CALCIUM 8.6* 8.3* 7.7* 8.2*  MG  --   --   --  1.8     Liver Function Tests: Recent Labs  Lab 06/07/22 2203  AST 39  ALT 34  ALKPHOS 116  BILITOT 0.5  PROT 6.4*  ALBUMIN 2.4*      Radiology Studies: CT ABDOMEN WO CONTRAST  Result Date: 06/12/2022 CLINICAL DATA:  Anatomy for possible G tube placement EXAM: CT ABDOMEN WITHOUT CONTRAST TECHNIQUE: Multidetector CT imaging of the abdomen was performed following the standard protocol without IV contrast. RADIATION DOSE REDUCTION: This exam was performed according to the departmental dose-optimization program which includes automated exposure control, adjustment of the mA and/or kV according to patient size and/or use of iterative reconstruction technique. COMPARISON:  February 26, 2012 FINDINGS: Lower chest: There is diffuse opacity with air bronchograms seen at the right lung base. There are some patchy opacities with reticulonodular interstitial changes seen at the left lung base. No pleural effusion. Hepatobiliary: No focal liver abnormality is seen. Mild to moderate hepatomegaly with the liver measuring 19 cm in the craniocaudad dimension. No gallstones, gallbladder wall thickening, or biliary dilatation. Pancreas: The body and tail of the pancreas has a normal appearance. Evaluation of the pancreas is limited in view of severe loss of the subcutaneous and mesenteric fat and without IV contrast. Spleen: Normal in size without focal abnormality. Adrenals/Urinary Tract: Adrenal glands are not well visualized.  Kidneys appear normal, without renal calculi, focal lesion, or hydronephrosis. Stomach/Bowel: Stomach is within normal limits. Appendix is not identified. No evidence of bowel wall thickening, distention, or inflammatory changes. The transverse colon is located inferior to the stomach. Vascular/Lymphatic: No significant vascular findings are present. No enlarged abdominal or pelvic lymph nodes. Other: Severe loss of the subcutaneous and mesenteric fat. Musculoskeletal: No acute or significant osseous findings. IMPRESSION: There are findings of cachexia seen with severe loss of the subcutaneous and mesenteric fat. Mild hepatomegaly. Body of the stomach is located inferior to the costal margin and transverse colon is located inferior to the stomach. Patient may be a candidate for percutaneous gastrostomy tube insertion. There is diffuse opacity with air bronchograms seen in the right lung base of likely pneumonia. Electronically Signed   By: Marjo Bicker M.D.   On: 06/12/2022 09:13      LOS: 5 days    Rhetta Mura, MD [facility] Available via Epic secure chat 7am-7pm After these hours, please refer to coverage provider listed on [email or web address] 06/13/2022, 2:18 PM

## 2022-06-13 NOTE — Progress Notes (Signed)
[facility]   Reason for visit: follow up on pneumonia  Interval History: no acute changes. Remains on oxygen.  No fever Day 7 total antibiotics  Physical Exam: Constitutional:  Vitals:   06/13/22 0335 06/13/22 0742  BP: (!) 140/96 (!) 141/97  Pulse: 65 (!) 59  Resp: 19 18  Temp: 98 F (36.7 C) 97.7 F (36.5 C)  SpO2: 93% 93%  He is in nad Respiratory: normal respiratory effort Skin: no rashes   Review of Systems: Constitutional: negative for fever or chills  Lab Results  Component Value Date   WBC 9.2 06/11/2022   HGB 10.1 (L) 06/11/2022   HCT 30.7 (L) 06/11/2022   MCV 79.3 (L) 06/11/2022   PLT 406 (H) 06/11/2022    Lab Results  Component Value Date   CREATININE 0.63 06/10/2022   BUN 21 (H) 06/10/2022   NA 135 06/10/2022   K 3.8 06/10/2022   CL 103 06/10/2022   CO2 22 06/10/2022    Lab Results  Component Value Date   ALT 34 06/07/2022   AST 39 06/07/2022   ALKPHOS 116 06/07/2022     Microbiology: Recent Results (from the past 240 hour(s))  Resp Panel by RT-PCR (Flu A&B, Covid) Anterior Nasal Swab     Status: None   Collection Time: 06/08/22  2:40 AM   Specimen: Anterior Nasal Swab  Result Value Ref Range Status   SARS Coronavirus 2 by RT PCR NEGATIVE NEGATIVE Final    Comment: (NOTE) SARS-CoV-2 target nucleic acids are NOT DETECTED.  The SARS-CoV-2 RNA is generally detectable in upper respiratory specimens during the acute phase of infection. The lowest concentration of SARS-CoV-2 viral copies this assay can detect is 138 copies/mL. A negative result does not preclude SARS-Cov-2 infection and should not be used as the sole basis for treatment or other patient management decisions. A negative result may occur with  improper specimen collection/handling, submission of specimen other than nasopharyngeal swab, presence of viral mutation(s) within the areas targeted by this assay, and inadequate number of  viral copies(<138 copies/mL). A negative result must be combined with clinical observations, patient history, and epidemiological information. The expected result is Negative.  Fact Sheet for Patients:  BloggerCourse.com  Fact Sheet for Healthcare Providers:  SeriousBroker.it  This test is no t yet approved or cleared by the [place] FDA and  has been authorized for detection and/or diagnosis of SARS-CoV-2 by FDA under an Emergency Use Authorization (EUA). This EUA will remain  in effect (meaning this test can be used) for the duration of the COVID-19 declaration under Section 564(b)(1) of the Act, 21 U.S.C.section 360bbb-3(b)(1), unless the authorization is terminated  or revoked sooner.       Influenza A by PCR NEGATIVE NEGATIVE Final   Influenza B by PCR NEGATIVE NEGATIVE Final    Comment: (NOTE) The Xpert Xpress SARS-CoV-2/FLU/RSV plus assay is intended as an aid in the diagnosis of influenza from Nasopharyngeal swab specimens and should not be used as a sole basis for treatment. Nasal washings and aspirates are unacceptable for Xpert Xpress SARS-CoV-2/FLU/RSV testing.  Fact Sheet for Patients: BloggerCourse.com  Fact Sheet for Healthcare Providers: SeriousBroker.it  This test is not yet approved or cleared by the [place] FDA and has been authorized for detection and/or diagnosis of SARS-CoV-2 by FDA under an Emergency Use Authorization (EUA). This EUA will remain in effect (meaning this test can be used) for the duration of the COVID-19 declaration under Section  564(b)(1) of the Act, 21 U.S.C. section 360bbb-3(b)(1), unless the authorization is terminated or revoked.  Performed at [facility], [address] [place], [place] [address]   Blood Culture (routine x 2)     Status: None   Collection Time: 06/08/22  2:50 AM   Specimen: BLOOD LEFT FOREARM   Result Value Ref Range Status   Specimen Description BLOOD LEFT FOREARM  Final   Special Requests   Final    BOTTLES DRAWN AEROBIC AND ANAEROBIC Blood Culture adequate volume   Culture   Final    NO GROWTH 5 DAYS Performed at [facility], [address] [place], [place] [address]    Report Status 06/13/2022 FINAL  Final  Blood Culture (routine x 2)     Status: None   Collection Time: 06/08/22  2:50 AM   Specimen: BLOOD RIGHT FOREARM  Result Value Ref Range Status   Specimen Description BLOOD RIGHT FOREARM  Final   Special Requests   Final    BOTTLES DRAWN AEROBIC AND ANAEROBIC Blood Culture adequate volume   Culture   Final    NO GROWTH 5 DAYS Performed at [facility], [address], [place], [place] [address]    Report Status 06/13/2022 FINAL  Final  Expectorated Sputum Assessment w Gram Stain, Rflx to Resp Cult     Status: None   Collection Time: 06/08/22  8:54 AM   Specimen: Expectorated Sputum  Result Value Ref Range Status   Specimen Description EXPECTORATED SPUTUM  Final   Special Requests NONE  Final   Sputum evaluation   Final    THIS SPECIMEN IS ACCEPTABLE FOR SPUTUM CULTURE Performed at [facility], [address] [place], [place] [address]    Report Status 06/10/2022 FINAL  Final  Culture, Respiratory w Gram Stain     Status: None (Preliminary result)   Collection Time: 06/08/22  8:54 AM  Result Value Ref Range Status   Specimen Description EXPECTORATED SPUTUM  Final   Special Requests NONE Reflexed from [id number]  Final   Gram Stain   Final    RARE WBC PRESENT,BOTH PMN AND MONONUCLEAR RARE GRAM POSITIVE COCCI IN CHAINS IN CLUSTERS RARE BUDDING YEAST SEEN    Culture   Final    CULTURE REINCUBATED FOR BETTER GROWTH FEW GRAM NEGATIVE RODS SUSCEPTIBILITIES TO FOLLOW Performed at [facility], [address], [place], [place] [address]    Report Status PENDING  Incomplete  MRSA Next Gen by PCR, Nasal     Status: None   Collection  Time: 06/08/22  6:03 PM   Specimen: Nasal Mucosa; Nasal Swab  Result Value Ref Range Status   MRSA by PCR Next Gen NOT DETECTED NOT DETECTED Final    Comment: (NOTE) The GeneXpert MRSA Assay (FDA approved for NASAL specimens only), is one component of a comprehensive MRSA colonization surveillance program. It is not intended to diagnose MRSA infection nor to guide or monitor treatment for MRSA infections. Test performance is not FDA approved in patients less than 35 years old. Performed at [facility], [address], [place], [place] [address]     Impression/Plan:  1.pneumonia - resolving with a right upper lobe opacity and can continue with antibiotics for 4 more days with amp/sulbactam or amoxicillin/clavulanate.    2.  Aspiration - he continues to have issue with aspiration and PEG tube placement planned for today for this and nutrition support.   I  will sign off, call with questions

## 2022-06-13 NOTE — Progress Notes (Signed)
Pt is back from IR. Abd dressing is clean, dry, intact. VS WDL. Pt is alert and oriented X4. Per pt he is "starving." Per MD it's ok to give some icecream and drinks.

## 2022-06-14 LAB — BASIC METABOLIC PANEL
Anion gap: 6 (ref 5–15)
BUN: 7 mg/dL (ref 6–20)
CO2: 31 mmol/L (ref 22–32)
Calcium: 8.7 mg/dL — ABNORMAL LOW (ref 8.9–10.3)
Chloride: 97 mmol/L — ABNORMAL LOW (ref 98–111)
Creatinine, Ser: 0.46 mg/dL — ABNORMAL LOW (ref 0.61–1.24)
GFR, Estimated: >60 mL/min (ref >60)
Glucose, Bld: 112 mg/dL — ABNORMAL HIGH (ref 70–99)
Potassium: 3.8 mmol/L (ref 3.5–5.1)
Sodium: 134 mmol/L — ABNORMAL LOW (ref 135–145)

## 2022-06-14 LAB — GLUCOSE, CAPILLARY
Glucose-Capillary: 103 mg/dL — ABNORMAL HIGH (ref 70–99)
Glucose-Capillary: 105 mg/dL — ABNORMAL HIGH (ref 70–99)
Glucose-Capillary: 107 mg/dL — ABNORMAL HIGH (ref 70–99)
Glucose-Capillary: 117 mg/dL — ABNORMAL HIGH (ref 70–99)
Glucose-Capillary: 120 mg/dL — ABNORMAL HIGH (ref 70–99)
Glucose-Capillary: 131 mg/dL — ABNORMAL HIGH (ref 70–99)
Glucose-Capillary: 88 mg/dL (ref 70–99)

## 2022-06-14 LAB — CBC
HCT: 33.5 % — ABNORMAL LOW (ref 39.0–52.0)
Hemoglobin: 10.9 g/dL — ABNORMAL LOW (ref 13.0–17.0)
MCH: 25.9 pg — ABNORMAL LOW (ref 26.0–34.0)
MCHC: 32.5 g/dL (ref 30.0–36.0)
MCV: 79.6 fL — ABNORMAL LOW (ref 80.0–100.0)
Platelets: 392 10*3/uL (ref 150–400)
RBC: 4.21 MIL/uL — ABNORMAL LOW (ref 4.22–5.81)
RDW: 13.4 % (ref 11.5–15.5)
WBC: 11.6 10*3/uL — ABNORMAL HIGH (ref 4.0–10.5)
nRBC: 0 % (ref 0.0–0.2)

## 2022-06-14 LAB — CULTURE, RESPIRATORY W GRAM STAIN

## 2022-06-14 MED ORDER — OSMOLITE 1.5 CAL PO LIQD
1000.0000 mL | ORAL | Status: DC
  Administered 2022-06-14: 1000 mL

## 2022-06-14 NOTE — Progress Notes (Signed)
Speech Language Pathology Treatment: Dysphagia  Patient Details Name: [name] MRN: [id number] DOB: 1981/12/07 Today's Date: 06/14/2022 Time: 4332-9518 SLP Time Calculation (min) (ACUTE ONLY): 9 min  Assessment / Plan / Recommendation Clinical Impression  Pt seen for ongoing dysphagia management.  Pt was eating AM meal in room largely unsupervised.  Pt exhibited intermittent-infrequent cough with PO intake, while SLP remained outside of room preparing educational hand out.  Pt with wet vocal quality in conversation.  He initially requested to postpone therapy to allow him to eat, but was agreeable to therapy with very minimal encouragement.  Pt was able to verbalize effortful swallow strategy when questioned about this week's MBS.  Pt demonstrated use of Mendelssohn and effortful swallow in isolation and with PO intake (mixed consistency cereal with milk). Pt states he has noted more coughing with liquids than with solids.  Introduced swallowing exercises to pt.  Pt demonstrated excellent effort and execution of CTAR, Masako, Mendelssohn, and Effortful swallow.  Shaker head lift and hold were explained verbally and in writing.  Masako was most difficult for pt with completion of 2 repetitions before endorsing fatigue.  Recommended to pt that he complete 10 reps of all exercises, 3x daily (or until fatigue, working up to these numbers with 2-3 reps of 60s Shaker hold.  Pt verbalized understanding and was given written copy of exercise instructions and recommendations.  Pt would likely benefit from continued ST visits to ensure completion of exercise program and for diet toleration management with known risk of aspiration.  Recommend continuing regular texture diet with thin liquid per pt request with known risk of aspiration using aspiration precautions and compensatory strategies as listed below.  Given pt's recall of and good demonstration of precautions, recommend intermittent supervision with PO  intake for safety and to ensure use of compensatory strategies.    HPI HPI: Pt is a 41 y.o. male who presented to the ED with cough, sputum production, and shortness of breath. Patient reported on admission that he did have pneumonia in April and was hospitalized for almost 2 and half weeks and subsequently was admitted for 4 days again. On last discharge from the hospital he was prescribed 3 L of oxygen but he was unable to bring the tank during travel to [place] [employer], [place].  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 Pt lost 47 pounds since initial hospitalization in April and per January, 2023 notes in Care Everywhere, he had lost 50lbs in the preceding 6 months. Pt has declined enteral nutrition from multiple providers including G-tube placement. Pt s/p I&D of neck abscess on 02/07/22 by ENT in [place]. Laryngoscopy during ENT visit on 02/21/22: Bilaterally effaced pyriform sinuses without masses or lesions. Pt started on antibiotics and Decadron to reduce inflammation of his throat; END suspected that this may be affected by severe acid reflux. MBS 04/17/22 at [facility]: severe to profound oropharyngeal dysphagia with interlabial escape, posterior escape of greater than half of bolus, repetitive/disorganized tongue motion, diffused oral residue , delayed swallow initiation, escape of bolus to nasopharynx, reduced anterior excursion and laryngeal elevation, reduced laryngeal vestibule closure, diminished pharyngeal stripping, and reduced tongue base retraction. Impairments led to a copious oropharyngeal residue and penetration and aspiration during and after the swallow of all consistencies. Laryngeal sensation was impaired and chin tuck, head tilt, supraglottic swallow, and effortful double swallows were ineffective. PMH: polysubstance  abuse/opiate dependence on suboxone,  active smoker, anxiety, depression, hepatitis C.      SLP Plan  Continue with current plan of care      Recommendations for follow up therapy are one component of a multi-disciplinary discharge planning process, led by the attending physician.  Recommendations may be updated based on patient status, additional functional criteria and insurance authorization.    Recommendations  Diet recommendations: Regular;Thin liquid (With known risk of aspiration)  Liquids provided via: Cup;No straw  Medication Administration: Crushed with puree  Supervision: Intermittent supervision to cue for compensatory strategies  Compensations:  Slow rate; Small sips/bites; Effortful swallow; Mendelssohn maneuver (squeeze and hold up Adam's apple with swallow)  Postural Changes and/or Swallow Maneuvers: Seated upright 90 degrees                Oral Care Recommendations: Oral care BID Follow Up Recommendations: Outpatient SLP Assistance recommended at discharge: Frequent or constant Supervision/Assistance SLP Visit Diagnosis: Dysphagia, pharyngeal phase (R13.13) Plan: Continue with current plan of care           Kerrie Pleasure, MA, CCC-SLP Acute Rehabilitation Services Office: [phone]; Pager (6/22): [phone] 06/14/2022, 10:25 AM

## 2022-06-14 NOTE — Progress Notes (Signed)
PROGRESS NOTE    [name]  AFB:[id number] DOB: [birth year] DOA: 06/07/2022 PCP: Pcp, No    Brief Narrative:  41 y.o. 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  pneumonia in April and was hospitalized for almost 2 and half weeks and subsequently was admitted for 4 days again.   On last discharge from the hospital, he was prescribed 3 L of oxygen but he was unable to bring the tank during travel to [place].  He has history of significant weight loss since all this started.  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    Patient's recent admission was for cachexia and failure to thrive in [place], [place] from 05/22/2022 to 05/25/2022. recurrent aspiration pneumonia. 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  He had declined enteral nutrition including PEG tube placement. Patient was then admitted to the hospital for further evaluation and treatment.  During hospitalization, patient has been seen by infectious disease Speech therapy has seen the patient--patient adamantly refuses change in diet  Due to significant malnutrition, PEG tube placed  6/21 IR guided peg placed 6/22 Osmolite per tube initiated  Assessment and plan. Principal Problem:   Left upper lobe pneumonia Active Problems:   Opiate dependence, continuous (HCC)   Dysphagia   Tobacco dependence   Protein-calorie malnutrition, severe (HCC)   Recurrent left upper lobe aspiration pneumonia. 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   Sputum culture with  rare gram-positive cocci in chains and rare budding yeast.  Unasyn-->Augmentin until 6/24, ending 10 days course Rx  Dysphagia  IR guided PEG tube placement. Osmolite started nutritionist overview -Rate currently at 50 TOC consulted to ensure that patient can get the PEG feeds  Oral thrush.  On fluconazole--stop 6/29  Opiate dependence Continue Suboxone as outpatient.  Has 1 month supply.   need outpatient follow-up and has been set up with internal medicine clinic for both outpatient follow-up and Suboxone Rx On amphetamine and THC.  Avoid opiates   Tobacco dependence Continues to smoke and vape.  Continue nicotine patch while in the hospital.  Patient was counseled against it  Severe protein calorie malnutrition Body mass index is 16.93 kg/m.  Present on admission.  Patient has significant malnutrition plan for PEG tube placement.  Patient was encouraged to eat and is on Ensure supplement.    DVT prophylaxis: enoxaparin (LOVENOX) injection 40 mg Start: 06/08/22 0900   Code Status:     Code Status: Full Code  Disposition: Home  Status is: Inpatient  Remains inpatient appropriate because: IV antibiotics, PEG tube placement pending   Family Communication:  Communicated with the patient at bedside  Consultants:  Infectious disease Pulmonary  Procedures:  None yet  Antimicrobials:  Diflucan, Unasyn   Anti-infectives (From admission, onward)    Start     Dose/Rate Route Frequency Ordered Stop   06/13/22 1515  amoxicillin-clavulanate (AUGMENTIN) 875-125 MG per tablet 1 tablet        1 tablet Oral Every 12 hours 06/13/22 1417 06/17/22 2359   06/13/22 0600  ceFAZolin (ANCEF) IVPB 2g/100 mL premix  2 g 200 mL/hr over 30 Minutes Intravenous To Radiology 06/12/22 1615 06/13/22 1130   06/11/22 1200  Ampicillin-Sulbactam (UNASYN) 3 g in sodium chloride 0.9 % 100 mL IVPB  Status:  Discontinued        3 g 200 mL/hr over 30 Minutes Intravenous Every 6 hours 06/11/22  1034 06/13/22 1417   06/09/22 1000  fluconazole (DIFLUCAN) tablet 100 mg        100 mg Oral Daily 06/08/22 1029 06/19/22 0959   06/08/22 1915  metroNIDAZOLE (FLAGYL) IVPB 500 mg  Status:  Discontinued        500 mg 100 mL/hr over 60 Minutes Intravenous Every 12 hours 06/08/22 1815 06/11/22 1034   06/08/22 1900  ceFEPIme (MAXIPIME) 2 g in sodium chloride 0.9 % 100 mL IVPB  Status:  Discontinued        2 g 200 mL/hr over 30 Minutes Intravenous Every 8 hours 06/08/22 1813 06/11/22 1034   06/08/22 1100  fluconazole (DIFLUCAN) tablet 200 mg        200 mg Oral  Once 06/08/22 1029 06/08/22 1208   06/08/22 0245  cefTRIAXone (ROCEPHIN) 2 g in sodium chloride 0.9 % 100 mL IVPB  Status:  Discontinued        2 g 200 mL/hr over 30 Minutes Intravenous Every 24 hours 06/08/22 0240 06/08/22 1801   06/08/22 0245  azithromycin (ZITHROMAX) 500 mg in sodium chloride 0.9 % 250 mL IVPB  Status:  Discontinued        500 mg 250 mL/hr over 60 Minutes Intravenous Every 24 hours 06/08/22 0240 06/09/22 1525      Subjective:  Still has a cough-eating 7-PEG tubes feeds in process Note desaturations overnight into the 80s when he was sleeping and was bumped up to higher flow now is back on 3 L  Objective: Vitals:   06/13/22 1946 06/14/22 0008 06/14/22 0447 06/14/22 0807  BP: (!) 146/100 (!) 140/98 (!) 139/98 (!) 145/83  Pulse: 64 74 76 (!) 58  Resp: 17 20 20 18   Temp: 98.1 F (36.7 C) 98 F (36.7 C) 97.9 F (36.6 C) 98 F (36.7 C)  TempSrc:  Oral  Oral  SpO2: 90% 95% 96% 97%  Weight:      Height:        Intake/Output Summary (Last 24 hours) at 06/14/2022 1411 Last data filed at 06/14/2022 1225 Gross per 24 hour  Intake 892.91 ml  Output 2175 ml  Net -1282.09 ml    Filed Weights   06/10/22 0500 06/11/22 0500 06/12/22 0212  Weight: 57.1 kg 50.6 kg 52.7 kg    Physical Examination: Body mass index is 17.16 kg/m.   Awake coherent animated multiple tattoos On 3 L of nasal oxygen Cta b no rales  no rhonchi no wheeze Abd soft  No le edema   Data Reviewed:   CBC: Recent Labs  Lab 06/07/22 2203 06/08/22 0539 06/09/22 0146 06/10/22 0121 06/11/22 0047 06/14/22 0940  WBC 16.2* 13.1* 10.2 11.1* 9.2 11.6*  NEUTROABS 14.7*  --   --   --   --   --   HGB 10.8* 10.4* 9.7* 11.3* 10.1* 10.9*  HCT 32.2* 31.6* 30.7* 34.3* 30.7* 33.5*  MCV 79.9* 80.4 80.4 79.4* 79.3* 79.6*  PLT 476* 440* 453* 496* 406* 392     Basic Metabolic Panel: Recent Labs  Lab 06/07/22 2203 06/08/22 0539 06/09/22 0146 06/10/22 0121 06/14/22 0940  NA 134* 134* 132* 135 134*  K 4.0 5.2* 3.6 3.8 3.8  CL 97* 98 101 103 97*  CO2 28 27 25 22 31   GLUCOSE 82 77 90 82 112*  BUN 31* 30* 25* 21* 7  CREATININE 0.71 0.68 0.68 0.63 0.46*  CALCIUM 8.6* 8.3* 7.7* 8.2* 8.7*  MG  --   --   --  1.8  --      Liver Function Tests: Recent Labs  Lab 06/07/22 2203  AST 39  ALT 34  ALKPHOS 116  BILITOT 0.5  PROT 6.4*  ALBUMIN 2.4*      Radiology Studies: IR GASTROSTOMY TUBE MOD SED  Result Date: 06/13/2022 INDICATION: Recurrent aspiration pneumonia. Failure to thrive. Please perform percutaneous gastrostomy tube placement for infection source control purposes. EXAM: PULL TROUGH GASTROSTOMY TUBE PLACEMENT COMPARISON:  None Available. MEDICATIONS: Ancef 2 gm IV; Antibiotics were administered within 1 hour of the procedure. Glucagon 1 mg IV CONTRAST:  15 mL of Omnipaque 300 administered into the gastric lumen. ANESTHESIA/SEDATION: Moderate (conscious) sedation was employed during this procedure as administered by the Interventional Radiology RN. A total of Versed 2.5 mg and Fentanyl 50 mcg was administered intravenously. Moderate Sedation Time: 12 minutes. The patient's level of consciousness and vital signs were monitored continuously by radiology nursing throughout the procedure under my direct supervision. FLUOROSCOPY TIME:  minutes  seconds ( mGy) COMPLICATIONS: None immediate. PROCEDURE: Informed written consent  was obtained from the patient following explanation of the procedure, risks, benefits and alternatives. A time out was performed prior to the initiation of the procedure. Ultrasound scanning was performed to demarcate the edge of the left lobe of the liver. Maximal barrier sterile technique utilized including caps, mask, sterile gowns, sterile gloves, large sterile drape, hand hygiene and Betadine prep. The left upper quadrant was sterilely prepped and draped. An oral gastric catheter was inserted into the stomach under fluoroscopy. The existing nasogastric feeding tube was removed. The left costal margin and air opacified transverse colon were identified and avoided. Air was injected into the stomach for insufflation and visualization under fluoroscopy. Under sterile conditions a 17 gauge trocar needle was utilized to access the stomach percutaneously beneath the left subcostal margin after the overlying soft tissues were anesthetized with 1% Lidocaine with epinephrine. Needle position was confirmed within the stomach with aspiration of air and injection of small amount of contrast. A single T tack was deployed for gastropexy. Over an Amplatz guide wire, a 9-French sheath was inserted into the stomach. A snare device was utilized to capture the oral gastric catheter. The snare device was pulled retrograde from the stomach up the esophagus and out the oropharynx. The 20-French pull-through gastrostomy was connected to the snare device and pulled antegrade through the oropharynx down the esophagus into the stomach and then through the percutaneous tract external to the patient. The gastrostomy was assembled externally. Contrast injection confirms appropriate positioning within the stomach. Several spot radiographic images were obtained in various obliquities for documentation. Dressings were applied. The patient tolerated procedure well without immediate post procedural complication. FINDINGS: After successful  fluoroscopic guided placement, the gastrostomy tube is appropriately positioned with internal disc positioned against the inner ventral wall of the gastric lumen. IMPRESSION: Successful fluoroscopic insertion of a 20-French pull-through gastrostomy tube. The gastrostomy may be used immediately for medication administration and in 24 hrs for the initiation of feeds. Electronically Signed   By: 06/15/2022 M.D.   On: 06/13/2022 15:32      LOS: 6 days    06/15/2022, MD [facility] Available via Epic secure  chat 7am-7pm After these hours, please refer to coverage provider listed on [email or web address] 06/14/2022, 2:11 PM

## 2022-06-14 NOTE — Progress Notes (Addendum)
Nutrition Follow-up / Consult  DOCUMENTATION CODES:   Underweight, Severe malnutrition in context of chronic illness  INTERVENTION:   Tube feeding via G-tube: Change to Osmolite 1.5 at 50 ml/h (1200 ml per day)  Provides 1800 kcal (90% of estimated needs), 75 gm protein (~70% of estimated needs, 914 ml free water daily.  Given hx/increased risk of aspiration, recommend continuous feedings via G-tube. If oral intake remains good, could transition to nocturnal feedings of Osmolite 1.5 at 50 ml/h x 12 hours per night.  Continue Regular diet.  Continue Ensure Enlive po TID, each supplement provides 350 kcal and 20 grams of protein.  Continue Prosource Plus 30 ml PO TID, each packet provides 100 kcal and 15 gm protein.  NUTRITION DIAGNOSIS:   Severe Malnutrition related to chronic illness (dysphagia) as evidenced by severe muscle depletion, severe fat depletion.  Ongoing   GOAL:   Patient will meet greater than or equal to 90% of their needs  Progressing  MONITOR:   TF tolerance, PO intake, Supplement acceptance  REASON FOR ASSESSMENT:   Malnutrition Screening Tool, Consult Assessment of nutrition requirement/status, Malnutrition Eval  ASSESSMENT:   Pt with medical history significant of polysubstance abuse, anxiety, depression, hepatitis C presents with cough, sputum production and shortness of breath.  S/P G-tube placement 6/21. Received MD Consult for TF initiation and management. TF was initiated early this AM: currently receiving Osmolite 1.2 at goal rate of 50 ml/h.   Patient with dysphagia and aspiration from nerve damage from retropharyngeal abscess. SLP following for appropriate diet consistencies. Patient has declined a dysphagia diet with thickened liquids. He is on a regular diet with known risk of aspiration. SLP working with patient on swallowing exercises.   Meal intakes documented at 80-100%. Patient reports good intake of meals and supplements. He  drinks Ensure Plus High Protein TID and Prosource Plus TID. He is also tolerating tube feeding without difficulty. No signs/symptoms of intolerance reported.   Labs reviewed.  CBG: [phone]  Medications reviewed and include Colace, MVI with minerals.    NUTRITION - FOCUSED PHYSICAL EXAM:  Flowsheet Row Most Recent Value  Orbital Region Severe depletion  Upper Arm Region Severe depletion  Thoracic and Lumbar Region Severe depletion  Buccal Region Severe depletion  Temple Region Severe depletion  Clavicle Bone Region Severe depletion  Clavicle and Acromion Bone Region Severe depletion  Scapular Bone Region Severe depletion  Dorsal Hand Severe depletion  Patellar Region Severe depletion  Anterior Thigh Region Severe depletion  Posterior Calf Region Severe depletion  Edema (RD Assessment) None  Hair Reviewed  Eyes Reviewed  Mouth Reviewed  Skin Reviewed  Nails Reviewed       Diet Order:   Diet Order             Diet regular Room service appropriate? Yes; Fluid consistency: Thin  Diet effective now                   EDUCATION NEEDS:   No education needs have been identified at this time  Skin:  Skin Assessment: Reviewed RN Assessment  Last BM:  6/21  Height:   Ht Readings from Last 1 Encounters:  06/08/22 5\' 9"  (1.753 m)    Weight:   Wt Readings from Last 1 Encounters:  06/12/22 52.7 kg    Ideal Body Weight:  72.7 kg  BMI:  Body mass index is 17.16 kg/m.  Estimated Nutritional Needs:   Kcal:  2000-2200  Protein:  110-125 grams  Fluid:  >  2 L    Gabriel Rainwater RD, LDN, CNSC Please refer to Amion for contact information.

## 2022-06-14 NOTE — Progress Notes (Signed)
Mobility Specialist Progress Note   06/14/22 1626  Mobility  Activity Ambulated with assistance in room  Level of Assistance Minimal assist, patient does 75% or more  Assistive Device  (HHA)  Distance Ambulated (ft) 32 ft  Activity Response Tolerated fair  $Mobility charge 1 Mobility   Pre Mobility: 95% SpO2 on 6LO2 During Mobility: 92% SpO2 on 8LO2 Post Mobility: 94% SpO2 on 6LO2  Received EOB on 6LO2 complaining of pain at NG site but agreeable to mobility. When ambulating in room pt quickly requesting to take a seated break d/t SOB. Pt sitting at 85% SpO2 on 6LO2 for ~28mins, bumped up to 8LO2 per advisement of RN and pt remaining >90% SpO2 w/ ambulation in the room. Returned back to EOB w/o fault and placed back on wall O2 at 6L, call bell placed in reach.  Frederico Hamman Mobility Specialist Phone Number [phone]

## 2022-06-14 NOTE — Plan of Care (Signed)
  Problem: Activity: Goal: Risk for activity intolerance will decrease Outcome: Progressing   Problem: Coping: Goal: Level of anxiety will decrease Outcome: Progressing   Problem: Elimination: Goal: Will not experience complications related to bowel motility Outcome: Progressing   Problem: Pain Managment: Goal: General experience of comfort will improve Outcome: Progressing   

## 2022-06-15 ENCOUNTER — Inpatient Hospital Stay (HOSPITAL_COMMUNITY): Payer: Self-pay

## 2022-06-15 ENCOUNTER — Other Ambulatory Visit (HOSPITAL_COMMUNITY): Payer: Self-pay

## 2022-06-15 LAB — GLUCOSE, CAPILLARY
Glucose-Capillary: 123 mg/dL — ABNORMAL HIGH (ref 70–99)
Glucose-Capillary: 102 mg/dL — ABNORMAL HIGH (ref 70–99)
Glucose-Capillary: 105 mg/dL — ABNORMAL HIGH (ref 70–99)
Glucose-Capillary: 88 mg/dL (ref 70–99)
Glucose-Capillary: 91 mg/dL (ref 70–99)

## 2022-06-15 MED ORDER — SODIUM CHLORIDE 0.9 % IV SOLN
3.0000 g | Freq: Four times a day (QID) | INTRAVENOUS | Status: DC
  Administered 2022-06-15 – 2022-06-19 (×14): 3 g via INTRAVENOUS
  Filled 2022-06-15 (×15): qty 8

## 2022-06-15 MED ORDER — PROPRANOLOL HCL 20 MG PO TABS
20.0000 mg | ORAL_TABLET | Freq: Two times a day (BID) | ORAL | 2 refills | Status: AC
  Filled 2022-06-15: qty 60, 30d supply, fill #0

## 2022-06-15 MED ORDER — AMOXICILLIN-POT CLAVULANATE 875-125 MG PO TABS
1.0000 | ORAL_TABLET | Freq: Two times a day (BID) | ORAL | 0 refills | Status: AC
  Filled 2022-06-15: qty 2, 1d supply, fill #0

## 2022-06-15 MED ORDER — NICOTINE 21 MG/24HR TD PT24
21.0000 mg | MEDICATED_PATCH | Freq: Every day | TRANSDERMAL | 0 refills | Status: AC
  Filled 2022-06-15: qty 28, 28d supply, fill #0

## 2022-06-15 MED ORDER — FREE WATER
60.0000 mL | Freq: Three times a day (TID) | Status: DC
  Administered 2022-06-15 – 2022-06-16 (×5): 60 mL

## 2022-06-15 MED ORDER — FLUCONAZOLE 100 MG PO TABS
100.0000 mg | ORAL_TABLET | Freq: Every day | ORAL | 0 refills | Status: AC
  Filled 2022-06-15: qty 5, 5d supply, fill #0

## 2022-06-15 MED ORDER — OSMOLITE 1.5 CAL PO LIQD
237.0000 mL | Freq: Three times a day (TID) | ORAL | 0 refills | Status: AC
  Filled 2022-06-15: qty 237, 1d supply, fill #0

## 2022-06-15 MED ORDER — FREE WATER: 200.0000 mL | Freq: Three times a day (TID) | Status: AC

## 2022-06-15 MED ORDER — GUAIFENESIN ER 600 MG PO TB12
600.0000 mg | ORAL_TABLET | Freq: Two times a day (BID) | ORAL | 0 refills | Status: AC | PRN
  Filled 2022-06-15: qty 30, 15d supply, fill #0

## 2022-06-15 MED ORDER — OSMOLITE 1.5 CAL PO LIQD
237.0000 mL | Freq: Three times a day (TID) | ORAL | Status: DC
Start: 2022-06-15 — End: 2022-06-16
  Administered 2022-06-15 – 2022-06-16 (×3): 237 mL
  Filled 2022-06-15 (×2): qty 237

## 2022-06-15 NOTE — TOC CM/SW Note (Signed)
MATCH letter entered. Isidoro Donning RN3 CCM, Heart Failure TOC CM [phone]

## 2022-06-15 NOTE — Discharge Summary (Addendum)
Physician Discharge Summary  [name] BJY:[id number] DOB: July 12, 1981 DOA: 06/07/2022  PCP: Pcp, No  Admit date: 06/07/2022 Discharge date: 06/19/2022  Time spent: 45 min (see addendum below)  Recommendations for Outpatient Follow-up:  Will complete Augmentin 6/24 and fluconazole on 6/29 We will follow-up with internal medicine clinic probably in the next 2 to 3 weeks and will need Suboxone continuation there-recommend Chem-12 CBC in the outpatient setting and monitoring of labs as is on feeds in addition to PEG feeds Suggest chest x-ray in about 1 to 2 months to denote clearing of infiltrates Recommend guideline based care based on his age-May also require hepatitis B etc. in the outpatient setting  Discharge Diagnoses:  MAIN problem for hospitalization   Severe aspiration pneumonia Severe malnutrition BMI less than 20 Oropharyngeal thrush on admission   Please see below for itemized issues addressed in HOpsital- refer to other progress notes for clarity if needed  Discharge Condition: Fair  Diet recommendation: PEG feeds in addition to regular diet (patient has been counseled extensively against oral diet and has been told very high risk for dysphagia)   Filed Weights   06/10/22 0500 06/11/22 0500 06/12/22 0212  Weight: 57.1 kg 50.6 kg 52.7 kg    History of present illness:  41 y.o. 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  pneumonia in April and was hospitalized for almost 2 and half weeks and subsequently was admitted for 4 days again.    On last discharge from the hospital, he was prescribed 3 L of oxygen but he was unable to bring the tank during travel to [place].  He has history of significant weight loss since all this  started.  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    atient's recent admission was for cachexia and failure to thrive in [place], [place] from 05/22/2022 to 05/25/2022. recurrent aspiration pneumonia. 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   At that time declined enteral nutrition including PEG tube placement. Patient was then admitted to the hospital for further evaluation and treatment.   During hospitalization, patient has been seen by infectious disease Speech therapy has seen the patient--patient adamantly refuses change in diet   Due to significant malnutrition, PEG tube placed   6/21 IR guided peg placed 6/22 Osmolite per tube initiated  Hospital Course:  Recurrent left upper lobe aspiration pneumonia. 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   Sputum culture with rare gram-positive cocci in chains and rare budding yeast.  Unasyn-->Augmentin until 6/24, ending 10 days course Rx   Dysphagia  IR guided PEG tube placement. Osmolite started nutritionist overview -Rate currently at 50 TOC consulted to ensure that patient can get the PEG feeds   Oral thrush.  On fluconazole--stop 6/29   Opiate dependence Continue Suboxone as outpatient.  Has 1 month supply.   need outpatient follow-up and has been set up with internal medicine clinic for both outpatient follow-up and Suboxone Rx On amphetamine and THC.  Avoid opiates   Tobacco dependence Continues to smoke and vape.  Continue nicotine patch while in the hospital.  Patient was counseled against  it   Severe protein calorie malnutrition Body mass index is 16.93 kg/m.  Present on admission.  Patient has significant malnutrition plan for PEG tube placement.  Patient was encouraged to eat and is on  Ensure supplement.      Discharge Exam: Vitals:   06/15/22 0405 06/15/22 0737  BP: (!) 129/91 (!) 165/109  Pulse: 77 81  Resp: 18   Temp: 98.5 F (36.9 C) 98 F (36.7 C)  SpO2: 90% 90%    Subj on day of d/c   Sitting up having a chair breath in no distress still coughing some  General Exam on discharge  EOMI NCAT no focal deficit no icterus no pallor Quite emaciated Chest clear no added sound Multiple tattoos upper arms and back Neurologically intact PEG tube in place infusing  Discharge Instructions   Discharge Instructions     Diet - low sodium heart healthy   Complete by: As directed    Discharge instructions   Complete by: As directed    Please continue oxygen 3 to 6 L 3 L at rest 6 L with motion and take your time moving around You have been recommended to be on a specific dysphagia diet-if you choose to eat regular food make sure that you use compensator strategies in addition to also ensuring that you are sitting upright at all times as there is a risk for you aspirating I would recommend that you follow-up with the internal medicine clinic for further medical care you will need labs in about 2 to 3 weeks They will also refill your Suboxone as well as other medications that you might need We will try to arrange bolus feeds for you-in between the feeds you may need 200 cc of fluid/water going forward in the tube to ensure that you do not become dehydrated Please comply with medication recommendations try to stay away from narcotics and other meds and Best of luck   Increase activity slowly   Complete by: As directed       Allergies as of 06/20/2022   No Known Allergies      Medication List     STOP taking these medications    hydrOXYzine 25 MG tablet Commonly known as: ATARAX       TAKE these medications    amoxicillin-clavulanate 875-125 MG tablet Commonly known as: AUGMENTIN Take 1 tablet by mouth every 12 (twelve) hours.   ARIPiprazole 10  MG tablet Commonly known as: ABILIFY Take 10 mg by mouth daily. What changed: Another medication with the same name was added. Make sure you understand how and when to take each.   ARIPiprazole 5 MG tablet Commonly known as: ABILIFY Take 1 tablet (5 mg total) by mouth daily. Start taking on: June 21, 2022 What changed: You were already taking a medication with the same name, and this prescription was added. Make sure you understand how and when to take each.   feeding supplement (OSMOLITE 1.5 CAL) Liqd Place 237 mLs into feeding tube 3 (three) times daily.   feeding supplement (OSMOLITE 1.2 CAL) Liqd Place 237 mLs into feeding tube 4 (four) times daily.   feeding supplement (PROSource TF) liquid Place 45 mLs into feeding tube 2 (two) times daily.   fluconazole 100 MG tablet Commonly known as: DIFLUCAN Take 1 tablet (100 mg total) by mouth daily for 5 days.   FLUoxetine 40 MG capsule Commonly known as: PROZAC Take 80 mg by mouth daily. What changed: Another medication with the same name  was added. Make sure you understand how and when to take each.   FLUoxetine 40 MG capsule Commonly known as: PROZAC Take 2 capsules (80 mg total) by mouth daily. Start taking on: June 21, 2022 What changed: You were already taking a medication with the same name, and this prescription was added. Make sure you understand how and when to take each.   free water Soln Place 200 mLs into feeding tube every 8 (eight) hours.   loperamide HCl 1 MG/7.5ML suspension Commonly known as: IMODIUM Place 15 mLs (2 mg total) into feeding tube as needed for diarrhea or loose stools.   nicotine 21 mg/24hr patch Commonly known as: NICODERM CQ - dosed in mg/24 hours Place 1 patch (21 mg total) onto the skin daily.   propranolol 20 MG tablet Commonly known as: INDERAL Take 1 tablet (20 mg total) by mouth 2 (two) times daily.   SM Mucus Relief 600 MG 12 hr tablet Generic drug: guaiFENesin Take 1 tablet  (600 mg total) by mouth 2 (two) times daily as needed for cough.               Durable Medical Equipment  (From admission, onward)           Start     Ordered   06/20/22 1141  For home use only DME Tube feeding  Once        06/20/22 1141   06/15/22 1157  DME Oxygen  Once       Question Answer Comment  Length of Need Lifetime   Mode or (Route) Nasal cannula   Liters per Minute 6   Frequency Continuous (stationary and portable oxygen unit needed)   Oxygen delivery system Gas      06/15/22 1157   06/15/22 1132  For home use only DME Tube feeding  Once       Comments:   Change to bolus/gravity tube feeding via G-tube: Osmolite 1.5 237 ml 1 carton TID (10am, 2pm, 6pm)   Provides 1065 kcal (50% of estimated needs), 45 gm protein (45% of estimated needs, 543 ml free water daily.   Duration : 30 Days Free water flushes 30 ml before and after each gravity feeding.   Continue Regular diet with known risk of aspiration.   Continue Ensure Enlive po TID, each supplement provides 350 kcal and 20 grams of protein.   D/C Prosource Plus.   06/15/22 1157           No Known Allergies  Follow-up Information     [facility] Follow up on 06/25/2022.   Why: You have a scheduled appointment on  06/25/2022 at 2:45 pm for hospital follow up visit with evaluation for suboxone clinic. Contact information: [address] [place] [address] [phone]                 The results of significant diagnostics from this hospitalization (including imaging, microbiology, ancillary and laboratory) are listed below for reference.    Significant Diagnostic Studies: IR GASTROSTOMY TUBE MOD SED  Result Date: 06/13/2022 INDICATION: Recurrent aspiration pneumonia. Failure to thrive. Please perform percutaneous gastrostomy tube placement for infection source control purposes. EXAM: PULL TROUGH GASTROSTOMY TUBE PLACEMENT COMPARISON:  None  Available. MEDICATIONS: Ancef 2 gm IV; Antibiotics were administered within 1 hour of the procedure. Glucagon 1 mg IV CONTRAST:  15 mL of Omnipaque 300 administered into the gastric lumen. ANESTHESIA/SEDATION: Moderate (conscious) sedation was employed during this procedure as  administered by the Interventional Radiology RN. A total of Versed 2.5 mg and Fentanyl 50 mcg was administered intravenously. Moderate Sedation Time: 12 minutes. The patient's level of consciousness and vital signs were monitored continuously by radiology nursing throughout the procedure under my direct supervision. FLUOROSCOPY TIME:  minutes  seconds ( mGy) COMPLICATIONS: None immediate. PROCEDURE: Informed written consent was obtained from the patient following explanation of the procedure, risks, benefits and alternatives. A time out was performed prior to the initiation of the procedure. Ultrasound scanning was performed to demarcate the edge of the left lobe of the liver. Maximal barrier sterile technique utilized including caps, mask, sterile gowns, sterile gloves, large sterile drape, hand hygiene and Betadine prep. The left upper quadrant was sterilely prepped and draped. An oral gastric catheter was inserted into the stomach under fluoroscopy. The existing nasogastric feeding tube was removed. The left costal margin and air opacified transverse colon were identified and avoided. Air was injected into the stomach for insufflation and visualization under fluoroscopy. Under sterile conditions a 17 gauge trocar needle was utilized to access the stomach percutaneously beneath the left subcostal margin after the overlying soft tissues were anesthetized with 1% Lidocaine with epinephrine. Needle position was confirmed within the stomach with aspiration of air and injection of small amount of contrast. A single T tack was deployed for gastropexy. Over an Amplatz guide wire, a 9-French sheath was inserted into the stomach. A snare device was  utilized to capture the oral gastric catheter. The snare device was pulled retrograde from the stomach up the esophagus and out the oropharynx. The 20-French pull-through gastrostomy was connected to the snare device and pulled antegrade through the oropharynx down the esophagus into the stomach and then through the percutaneous tract external to the patient. The gastrostomy was assembled externally. Contrast injection confirms appropriate positioning within the stomach. Several spot radiographic images were obtained in various obliquities for documentation. Dressings were applied. The patient tolerated procedure well without immediate post procedural complication. FINDINGS: After successful fluoroscopic guided placement, the gastrostomy tube is appropriately positioned with internal disc positioned against the inner ventral wall of the gastric lumen. IMPRESSION: Successful fluoroscopic insertion of a 20-French pull-through gastrostomy tube. The gastrostomy may be used immediately for medication administration and in 24 hrs for the initiation of feeds. Electronically Signed   By: Simonne Come M.D.   On: 06/13/2022 15:32   CT ABDOMEN WO CONTRAST  Result Date: 06/12/2022 CLINICAL DATA:  Anatomy for possible G tube placement EXAM: CT ABDOMEN WITHOUT CONTRAST TECHNIQUE: Multidetector CT imaging of the abdomen was performed following the standard protocol without IV contrast. RADIATION DOSE REDUCTION: This exam was performed according to the departmental dose-optimization program which includes automated exposure control, adjustment of the mA and/or kV according to patient size and/or use of iterative reconstruction technique. COMPARISON:  February 26, 2012 FINDINGS: Lower chest: There is diffuse opacity with air bronchograms seen at the right lung base. There are some patchy opacities with reticulonodular interstitial changes seen at the left lung base. No pleural effusion. Hepatobiliary: No focal liver abnormality is  seen. Mild to moderate hepatomegaly with the liver measuring 19 cm in the craniocaudad dimension. No gallstones, gallbladder wall thickening, or biliary dilatation. Pancreas: The body and tail of the pancreas has a normal appearance. Evaluation of the pancreas is limited in view of severe loss of the subcutaneous and mesenteric fat and without IV contrast. Spleen: Normal in size without focal abnormality. Adrenals/Urinary Tract: Adrenal glands are not well visualized. Kidneys  appear normal, without renal calculi, focal lesion, or hydronephrosis. Stomach/Bowel: Stomach is within normal limits. Appendix is not identified. No evidence of bowel wall thickening, distention, or inflammatory changes. The transverse colon is located inferior to the stomach. Vascular/Lymphatic: No significant vascular findings are present. No enlarged abdominal or pelvic lymph nodes. Other: Severe loss of the subcutaneous and mesenteric fat. Musculoskeletal: No acute or significant osseous findings. IMPRESSION: There are findings of cachexia seen with severe loss of the subcutaneous and mesenteric fat. Mild hepatomegaly. Body of the stomach is located inferior to the costal margin and transverse colon is located inferior to the stomach. Patient may be a candidate for percutaneous gastrostomy tube insertion. There is diffuse opacity with air bronchograms seen in the right lung base of likely pneumonia. Electronically Signed   By: Marjo Bicker M.D.   On: 06/12/2022 09:13   DG Swallowing Func-Speech Pathology  Result Date: 06/11/2022 Table formatting from the original result was not included. 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   Colitis   Depression   Diverticulitis   Drug abuse (HCC)   Hepatitis C   MRSA (methicillin resistant staph aureus) culture positive  Past Surgical History: Past Surgical History: Procedure Laterality Date  COLONOSCOPY    I & D EXTREMITY  11/10/2011  Procedure: IRRIGATION AND DEBRIDEMENT EXTREMITY;  Surgeon: Kathryne Hitch;  Location: [facility] OR;  Service: Orthopedics;  Laterality: Right;  with volar compartment release  I & D EXTREMITY  12/24/2011  Procedure: IRRIGATION AND DEBRIDEMENT EXTREMITY;  Surgeon: Eldred Manges;  Location: [facility] OR;  Service: Orthopedics;  Laterality: Right; HPI: Pt is a 41 y.o. male who presented to the ED with cough, sputum production, and shortness of breath. Patient reported on admission that he did have pneumonia in April and was hospitalized for almost 2 and half weeks and subsequently was admitted for 4 days again. On last discharge from the hospital he was prescribed 3 L of oxygen but he was unable to bring the tank during travel to [employer], [place].  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 Pt lost 47 pounds since initial hospitalization in April and per January, 2023 notes in Care Everywhere, he had lost 50lbs in the preceding 6 months. Pt has declined enteral nutrition from multiple providers including G-tube placement. Pt s/p I&D of neck abscess on 02/07/22 by ENT in [place]. Laryngoscopy during ENT visit on 02/21/22: Bilaterally effaced pyriform sinuses without masses or lesions. Pt started on antibiotics and Decadron to reduce inflammation of his throat; END suspected that this may be affected by severe acid reflux. MBS 04/17/22 at [facility]: severe to profound oropharyngeal dysphagia with interlabial escape, posterior escape of greater than half of bolus,  repetitive/disorganized tongue motion, diffused oral residue , delayed swallow initiation, escape of bolus to nasopharynx, reduced anterior excursion and laryngeal elevation, reduced laryngeal vestibule closure, diminished pharyngeal stripping, and reduced tongue base retraction. Impairments led to a copious oropharyngeal residue and penetration and aspiration during and after the swallow of all consistencies. Laryngeal sensation was  impaired and chin tuck, head tilt, supraglottic swallow, and effortful double swallows were ineffective. PMH: polysubstance abuse/opiate dependence on suboxone, active smoker, anxiety, depression, hepatitis C.  Subjective: cooperative, impulsive  Recommendations for follow up therapy are one component of a multi-disciplinary discharge planning process, led by the attending physician.  Recommendations may be updated based on patient status, additional functional criteria and insurance authorization. Assessment / Plan / Recommendation   06/11/2022  12:57 PM Clinical Impressions Clinical Impression Pt presents with pharyngeal dysphagia characterized by reduction in laryngeal elevation, anterior laryngeal movement, tongue base retraction, and pharyngeal constriction. He demonstratrated limited epiglottic inversion and laryngeal closure, minimal PES distention, posterior pharyngeal wall residue, vallecular residue, and pyriform sinus residue. Penetration (PAS 5) and aspiration (PAS 7,8) were noted with thin liquids, nectar thick liquids, and honey thick liquids. Laryngeal invasion and pharyngeal clearance were reduced, but not eliminated with prompted secondary swallows and use of a chin tuck posture. No functional benefit was noted with head lateralization. With training and visual feedback, a mendelsohn maneuver combined with effortful swallows was most effective in improving laryngeal closure, pharyngeal residue, and reducing aspiration risk with modified consistencies. Still, pt's risk of  aspiration during and after deglutition is judged to be moderate-high at this time considering impaired laryngeal closure, and pharyngeal residue. Pt was educated regarding the nature and severity of his dysphagia. However, as he has reportedly expressed to previous SLPs in [place], he stated, "I'm not going to stop eating; I'm going to keep on eating." Pt has verbalized understanding regarding the potential ramifications of continuing a p.o. diet and not observing compensatory strategies. A dysphagia 2 diet with nectar thick liquids poses reduced risk for dysphagia-related adverse events if swallowing precautions, especially use of the Mendelsohn maneuver, are strictly observed. Pt verbalized agreement with this plan and also expressed to SLP that, considering his continued weight loss, he would now be interested in long-term enteral nutrition (e.g., G-tube) if he can still continue to have a p.o. diet. SLP will follow for dysphagia treatment. SLP Visit Diagnosis Dysphagia, pharyngeal phase (R13.13) Impact on safety and function Moderate aspiration risk     06/11/2022  12:57 PM Treatment Recommendations Treatment Recommendations Therapy as outlined in treatment plan below     06/11/2022  12:57 PM Prognosis Prognosis for Safe Diet Advancement Fair Barriers to Reach Goals Time post onset;Severity of deficits   06/11/2022  12:57 PM Diet Recommendations SLP Diet Recommendations Dysphagia 2 (Fine chop) solids;Nectar thick liquid Liquid Administration via Cup;No straw Medication Administration Crushed with puree Compensations Slow rate;Small sips/bites;Effortful swallow Postural Changes Seated upright at 90 degrees;Remain semi-upright after after feeds/meals (Comment)     06/11/2022  12:57 PM Other Recommendations Oral Care Recommendations Oral care BID Follow Up Recommendations Outpatient SLP Functional Status Assessment Patient has not had a recent decline in their functional status   06/11/2022  12:57 PM Frequency and  Duration  Speech Therapy Frequency (ACUTE ONLY) min 2x/week Treatment Duration 2 weeks     06/11/2022  12:57 PM Oral Phase Oral Phase [facility]    06/11/2022  12:57 PM Pharyngeal Phase Pharyngeal Phase Impaired Pharyngeal- Honey Teaspoon Reduced pharyngeal peristalsis;Reduced epiglottic inversion;Reduced laryngeal elevation;Reduced anterior laryngeal mobility;Reduced airway/laryngeal closure;Reduced tongue base retraction;Trace aspiration;Pharyngeal residue - valleculae;Pharyngeal residue - pyriform;Pharyngeal residue - posterior pharnyx;Penetration/Apiration after swallow Pharyngeal Material enters airway, CONTACTS cords and not ejected out;Material enters airway, passes BELOW cords without attempt by patient to eject out (silent aspiration) Pharyngeal- Honey Cup Reduced pharyngeal peristalsis;Reduced epiglottic inversion;Reduced laryngeal elevation;Reduced anterior laryngeal mobility;Reduced  airway/laryngeal closure;Reduced tongue base retraction;Trace aspiration;Pharyngeal residue - valleculae;Pharyngeal residue - pyriform;Pharyngeal residue - posterior pharnyx;Penetration/Apiration after swallow Pharyngeal Material enters airway, CONTACTS cords and not ejected out;Material enters airway, passes BELOW cords without attempt by patient to eject out (silent aspiration) Pharyngeal- Nectar Cup Reduced pharyngeal peristalsis;Reduced epiglottic inversion;Reduced laryngeal elevation;Reduced anterior laryngeal mobility;Reduced airway/laryngeal closure;Reduced tongue base retraction;Trace aspiration;Pharyngeal residue - valleculae;Pharyngeal residue - pyriform;Pharyngeal residue - posterior pharnyx;Penetration/Apiration after swallow Pharyngeal Material enters airway, passes BELOW cords and not ejected out despite cough attempt by patient;Material enters airway, passes BELOW cords without attempt by patient to eject out (silent aspiration) Pharyngeal- Thin Cup Reduced pharyngeal peristalsis;Reduced epiglottic inversion;Reduced  laryngeal elevation;Reduced anterior laryngeal mobility;Reduced airway/laryngeal closure;Reduced tongue base retraction;Pharyngeal residue - valleculae;Pharyngeal residue - pyriform;Pharyngeal residue - posterior pharnyx;Penetration/Apiration after swallow;Moderate aspiration Pharyngeal Material enters airway, passes BELOW cords and not ejected out despite cough attempt by patient;Material enters airway, passes BELOW cords without attempt by patient to eject out (silent aspiration) Pharyngeal- Puree Reduced pharyngeal peristalsis;Reduced epiglottic inversion;Reduced laryngeal elevation;Reduced anterior laryngeal mobility;Reduced airway/laryngeal closure;Reduced tongue base retraction;Pharyngeal residue - valleculae;Pharyngeal residue - pyriform;Pharyngeal residue - posterior pharnyx Pharyngeal- Mechanical Soft Reduced pharyngeal peristalsis;Reduced epiglottic inversion;Reduced laryngeal elevation;Reduced anterior laryngeal mobility;Reduced airway/laryngeal closure;Reduced tongue base retraction;Pharyngeal residue - valleculae;Pharyngeal residue - pyriform;Pharyngeal residue - posterior pharnyx  Shanika I. Vear ClockPhillips, MS, CCC-SLP Acute Rehabilitation Services Office number [phone] Pager [phone] Scheryl MartenShanika I Phillips 06/11/2022, 3:26 PM                     DG Chest 1 View  Result Date: 06/07/2022 CLINICAL DATA:  Pneumonia.  Shortness of breath.  Cough. EXAM: CHEST  1 VIEW COMPARISON:  08/01/2011 FINDINGS: Airspace disease in the left upper lobe compatible with pneumonia. No confluent opacity on the right. Heart is normal size. No effusions or acute bony abnormality. IMPRESSION: Left upper lobe pneumonia. Electronically Signed   By: Charlett NoseKevin  Dover M.D.   On: 06/07/2022 22:17    Microbiology: Recent Results (from the past 240 hour(s))  Resp Panel by RT-PCR (Flu A&B, Covid) Anterior Nasal Swab     Status: None   Collection Time: 06/08/22  2:40 AM   Specimen: Anterior Nasal Swab  Result Value Ref Range Status    SARS Coronavirus 2 by RT PCR NEGATIVE NEGATIVE Final    Comment: (NOTE) SARS-CoV-2 target nucleic acids are NOT DETECTED.  The SARS-CoV-2 RNA is generally detectable in upper respiratory specimens during the acute phase of infection. The lowest concentration of SARS-CoV-2 viral copies this assay can detect is 138 copies/mL. A negative result does not preclude SARS-Cov-2 infection and should not be used as the sole basis for treatment or other patient management decisions. A negative result may occur with  improper specimen collection/handling, submission of specimen other than nasopharyngeal swab, presence of viral mutation(s) within the areas targeted by this assay, and inadequate number of viral copies(<138 copies/mL). A negative result must be combined with clinical observations, patient history, and epidemiological information. The expected result is Negative.  Fact Sheet for Patients:  BloggerCourse.comhttps://www.fda.gov/media/152166/download  Fact Sheet for Healthcare Providers:  SeriousBroker.ithttps://www.fda.gov/media/152162/download  This test is no t yet approved or cleared by the [place] FDA and  has been authorized for detection and/or diagnosis of SARS-CoV-2 by FDA under an Emergency Use Authorization (EUA). This EUA will remain  in effect (meaning this test can be used) for the duration of the COVID-19 declaration under Section 564(b)(1) of the Act, 21 U.S.C.section 360bbb-3(b)(1), unless the authorization is terminated  or revoked sooner.  Influenza A by PCR NEGATIVE NEGATIVE Final   Influenza B by PCR NEGATIVE NEGATIVE Final    Comment: (NOTE) The Xpert Xpress SARS-CoV-2/FLU/RSV plus assay is intended as an aid in the diagnosis of influenza from Nasopharyngeal swab specimens and should not be used as a sole basis for treatment. Nasal washings and aspirates are unacceptable for Xpert Xpress SARS-CoV-2/FLU/RSV testing.  Fact Sheet for  Patients: BloggerCourse.com  Fact Sheet for Healthcare Providers: SeriousBroker.it  This test is not yet approved or cleared by the [place] FDA and has been authorized for detection and/or diagnosis of SARS-CoV-2 by FDA under an Emergency Use Authorization (EUA). This EUA will remain in effect (meaning this test can be used) for the duration of the COVID-19 declaration under Section 564(b)(1) of the Act, 21 U.S.C. section 360bbb-3(b)(1), unless the authorization is terminated or revoked.  Performed at [facility], [address] [place], [place] [address]   Blood Culture (routine x 2)     Status: None   Collection Time: 06/08/22  2:50 AM   Specimen: BLOOD LEFT FOREARM  Result Value Ref Range Status   Specimen Description BLOOD LEFT FOREARM  Final   Special Requests   Final    BOTTLES DRAWN AEROBIC AND ANAEROBIC Blood Culture adequate volume   Culture   Final    NO GROWTH 5 DAYS Performed at [facility], [address], [place], [place] [address]    Report Status 06/13/2022 FINAL  Final  Blood Culture (routine x 2)     Status: None   Collection Time: 06/08/22  2:50 AM   Specimen: BLOOD RIGHT FOREARM  Result Value Ref Range Status   Specimen Description BLOOD RIGHT FOREARM  Final   Special Requests   Final    BOTTLES DRAWN AEROBIC AND ANAEROBIC Blood Culture adequate volume   Culture   Final    NO GROWTH 5 DAYS Performed at [facility], [address] [place], [place] [address]    Report Status 06/13/2022 FINAL  Final  Expectorated Sputum Assessment w Gram Stain, Rflx to Resp Cult     Status: None   Collection Time: 06/08/22  8:54 AM   Specimen: Expectorated Sputum  Result Value Ref Range Status   Specimen Description EXPECTORATED SPUTUM  Final   Special Requests NONE  Final   Sputum evaluation   Final    THIS SPECIMEN IS ACCEPTABLE FOR SPUTUM CULTURE Performed at [facility], [address], [place], [place] [address]    Report Status 06/10/2022 FINAL  Final  Culture, Respiratory w Gram Stain     Status: None   Collection Time: 06/08/22  8:54 AM  Result Value Ref Range Status   Specimen Description EXPECTORATED SPUTUM  Final   Special Requests NONE Reflexed from [id number]  Final   Gram Stain   Final    RARE WBC PRESENT,BOTH PMN AND MONONUCLEAR RARE GRAM POSITIVE COCCI IN CHAINS IN CLUSTERS RARE BUDDING YEAST SEEN Performed at [facility], [address] [place], [place] [address]    Culture   Final    FEW ENTEROCOCCUS FAECALIS FEW KLEBSIELLA PNEUMONIAE    Report Status 06/14/2022 FINAL  Final   Organism ID, Bacteria ENTEROCOCCUS FAECALIS  Final   Organism ID, Bacteria KLEBSIELLA PNEUMONIAE  Final      Susceptibility   Enterococcus faecalis - MIC*    AMPICILLIN <=2 SENSITIVE Sensitive     VANCOMYCIN 2 SENSITIVE Sensitive  GENTAMICIN SYNERGY SENSITIVE Sensitive     * FEW ENTEROCOCCUS FAECALIS   Klebsiella pneumoniae - MIC*    AMPICILLIN RESISTANT Resistant     CEFAZOLIN <=4 SENSITIVE Sensitive     CEFEPIME <=0.12 SENSITIVE Sensitive     CEFTAZIDIME <=1 SENSITIVE Sensitive     CEFTRIAXONE <=0.25 SENSITIVE Sensitive     CIPROFLOXACIN <=0.25 SENSITIVE Sensitive     GENTAMICIN <=1 SENSITIVE Sensitive     IMIPENEM <=0.25 SENSITIVE Sensitive     TRIMETH/SULFA <=20 SENSITIVE Sensitive     AMPICILLIN/SULBACTAM 4 SENSITIVE Sensitive     PIP/TAZO <=4 SENSITIVE Sensitive     * FEW KLEBSIELLA PNEUMONIAE  MRSA Next Gen by PCR, Nasal     Status: None   Collection Time: 06/08/22  6:03 PM   Specimen: Nasal Mucosa; Nasal Swab  Result Value Ref Range Status   MRSA by PCR Next Gen NOT DETECTED NOT DETECTED Final    Comment: (NOTE) The GeneXpert MRSA Assay (FDA approved for NASAL specimens only), is one component of a comprehensive MRSA colonization surveillance program. It is not intended to diagnose MRSA infection nor to guide or monitor treatment for MRSA  infections. Test performance is not FDA approved in patients less than 5 years old. Performed at [facility], [address], [place], [place] [address]      Labs: Basic Metabolic Panel: Recent Labs  Lab 06/09/22 0146 06/10/22 0121 06/14/22 0940  NA 132* 135 134*  K 3.6 3.8 3.8  CL 101 103 97*  CO2 25 22 31   GLUCOSE 90 82 112*  BUN 25* 21* 7  CREATININE 0.68 0.63 0.46*  CALCIUM 7.7* 8.2* 8.7*  MG  --  1.8  --    Liver Function Tests: No results for input(s): "AST", "ALT", "ALKPHOS", "BILITOT", "PROT", "ALBUMIN" in the last 168 hours. No results for input(s): "LIPASE", "AMYLASE" in the last 168 hours. No results for input(s): "AMMONIA" in the last 168 hours. CBC: Recent Labs  Lab 06/09/22 0146 06/10/22 0121 06/11/22 0047 06/14/22 0940  WBC 10.2 11.1* 9.2 11.6*  HGB 9.7* 11.3* 10.1* 10.9*  HCT 30.7* 34.3* 30.7* 33.5*  MCV 80.4 79.4* 79.3* 79.6*  PLT 453* 496* 406* 392   Cardiac Enzymes: No results for input(s): "CKTOTAL", "CKMB", "CKMBINDEX", "TROPONINI" in the last 168 hours. BNP: BNP (last 3 results) No results for input(s): "BNP" in the last 8760 hours.  ProBNP (last 3 results) No results for input(s): "PROBNP" in the last 8760 hours.  CBG: Recent Labs  Lab 06/14/22 1548 06/14/22 2003 06/14/22 2324 06/15/22 0354 06/15/22 0739  GLUCAP 117* 120* 107* 123* 102*    Signed:  06/17/22 MD   [facility] 06/15/2022, 11:16 AM  Discharge summary addendum:  On the day of schedule discharge the patient was found unresponsive due to suspected aspiration and surreptitious drug use.  He required reintubation and mechanical ventilation a further course of antibiotic therapy.  He was successfully extubated on 6/27. He was reassessed by SLP who once again recommended a modified diet which he given stated that he would not comply with.  He stated that he would rather die than be unable to eat.  I made clear to him that such a request would  lead him to be reintubated for reaspiration almost certainly and that his prior request for ongoing aggressive care was not consistent with such behavior.  He then agreed to a DNR/DNI status which was documented on a MOST form.  He is being discharged home  on tube feeds.  He will be provided with recommendations for swallowing exercises and a modified diet.  He is requested to follow-up with his outpatient psychiatric and family practice providers.  45 minutes was spent in the preparation of this discharge.  Lynnell Catalan, MD [facility] ICU Physician [facility]  Pager: [phone] Or Epic Secure Chat After hours: [phone].  06/20/2022, 12:06 PM

## 2022-06-16 LAB — CBC WITH DIFFERENTIAL/PLATELET
Abs Immature Granulocytes: 0.09 10*3/uL — ABNORMAL HIGH (ref 0.00–0.07)
Basophils Absolute: 0.1 10*3/uL (ref 0.0–0.1)
Basophils Relative: 0 %
Eosinophils Absolute: 0.3 10*3/uL (ref 0.0–0.5)
Eosinophils Relative: 2 %
HCT: 30.1 % — ABNORMAL LOW (ref 39.0–52.0)
Hemoglobin: 10 g/dL — ABNORMAL LOW (ref 13.0–17.0)
Immature Granulocytes: 1 %
Lymphocytes Relative: 10 %
Lymphs Abs: 1.5 10*3/uL (ref 0.7–4.0)
MCH: 26.1 pg (ref 26.0–34.0)
MCHC: 33.2 g/dL (ref 30.0–36.0)
MCV: 78.6 fL — ABNORMAL LOW (ref 80.0–100.0)
Monocytes Absolute: 0.9 10*3/uL (ref 0.1–1.0)
Monocytes Relative: 6 %
Neutro Abs: 12.9 10*3/uL — ABNORMAL HIGH (ref 1.7–7.7)
Neutrophils Relative %: 81 %
Platelets: 395 10*3/uL (ref 150–400)
RBC: 3.83 MIL/uL — ABNORMAL LOW (ref 4.22–5.81)
RDW: 13.7 % (ref 11.5–15.5)
WBC: 15.7 10*3/uL — ABNORMAL HIGH (ref 4.0–10.5)
nRBC: 0 % (ref 0.0–0.2)

## 2022-06-16 LAB — COMPREHENSIVE METABOLIC PANEL
ALT: 115 U/L — ABNORMAL HIGH (ref 0–44)
AST: 79 U/L — ABNORMAL HIGH (ref 15–41)
Albumin: 2.2 g/dL — ABNORMAL LOW (ref 3.5–5.0)
Alkaline Phosphatase: 105 U/L (ref 38–126)
Anion gap: 10 (ref 5–15)
BUN: 10 mg/dL (ref 6–20)
CO2: 29 mmol/L (ref 22–32)
Calcium: 8.9 mg/dL (ref 8.9–10.3)
Chloride: 94 mmol/L — ABNORMAL LOW (ref 98–111)
Creatinine, Ser: 0.46 mg/dL — ABNORMAL LOW (ref 0.61–1.24)
GFR, Estimated: >60 mL/min (ref >60)
Glucose, Bld: 97 mg/dL (ref 70–99)
Potassium: 4.7 mmol/L (ref 3.5–5.1)
Sodium: 133 mmol/L — ABNORMAL LOW (ref 135–145)
Total Bilirubin: 0.2 mg/dL — ABNORMAL LOW (ref 0.3–1.2)
Total Protein: 6.8 g/dL (ref 6.5–8.1)

## 2022-06-16 LAB — GLUCOSE, CAPILLARY
Glucose-Capillary: 118 mg/dL — ABNORMAL HIGH (ref 70–99)
Glucose-Capillary: 129 mg/dL — ABNORMAL HIGH (ref 70–99)
Glucose-Capillary: 129 mg/dL — ABNORMAL HIGH (ref 70–99)
Glucose-Capillary: 154 mg/dL — ABNORMAL HIGH (ref 70–99)
Glucose-Capillary: 97 mg/dL (ref 70–99)

## 2022-06-16 MED ORDER — FLUCONAZOLE 100 MG PO TABS
100.0000 mg | ORAL_TABLET | Freq: Every day | ORAL | Status: AC
  Administered 2022-06-17 – 2022-06-18 (×2): 100 mg
  Filled 2022-06-16 (×2): qty 1

## 2022-06-16 MED ORDER — PROPRANOLOL HCL 20 MG PO TABS
20.0000 mg | ORAL_TABLET | Freq: Two times a day (BID) | ORAL | Status: DC
Start: 2022-06-16 — End: 2022-06-20
  Administered 2022-06-16 – 2022-06-20 (×5): 20 mg
  Filled 2022-06-16 (×8): qty 1

## 2022-06-16 MED ORDER — ADULT MULTIVITAMIN W/MINERALS CH
1.0000 | ORAL_TABLET | Freq: Every day | ORAL | Status: DC
  Administered 2022-06-17 – 2022-06-20 (×4): 1
  Filled 2022-06-16 (×4): qty 1

## 2022-06-16 MED ORDER — POLYETHYLENE GLYCOL 3350 17 G PO PACK
17.0000 g | PACK | Freq: Every day | ORAL | Status: DC | PRN
Start: 2022-06-16 — End: 2022-06-20

## 2022-06-16 MED ORDER — DOCUSATE SODIUM 50 MG/5ML PO LIQD
100.0000 mg | Freq: Two times a day (BID) | ORAL | Status: DC
  Administered 2022-06-17 – 2022-06-20 (×6): 100 mg
  Filled 2022-06-16 (×7): qty 10

## 2022-06-16 MED ORDER — FLUOXETINE HCL 20 MG/5ML PO SOLN
80.0000 mg | Freq: Every day | ORAL | Status: DC
  Administered 2022-06-17 – 2022-06-20 (×4): 80 mg
  Filled 2022-06-16 (×5): qty 20

## 2022-06-16 MED ORDER — ALPRAZOLAM 0.5 MG PO TABS
1.0000 mg | ORAL_TABLET | Freq: Two times a day (BID) | ORAL | Status: DC | PRN
  Administered 2022-06-16: 1 mg via ORAL
  Filled 2022-06-16: qty 2

## 2022-06-16 MED ORDER — ARIPIPRAZOLE 5 MG PO TABS
5.0000 mg | ORAL_TABLET | Freq: Every day | ORAL | Status: DC
Start: 2022-06-17 — End: 2022-06-20
  Administered 2022-06-17 – 2022-06-20 (×4): 5 mg
  Filled 2022-06-16 (×4): qty 1

## 2022-06-16 MED ORDER — ACETAMINOPHEN 325 MG PO TABS
650.0000 mg | ORAL_TABLET | Freq: Four times a day (QID) | ORAL | Status: DC | PRN
  Administered 2022-06-16: 650 mg
  Filled 2022-06-16: qty 2

## 2022-06-16 MED ORDER — ACETAMINOPHEN 650 MG RE SUPP: 650.0000 mg | Freq: Four times a day (QID) | RECTAL | Status: DC | PRN

## 2022-06-16 MED ORDER — LOPERAMIDE HCL 1 MG/7.5ML PO SUSP
2.0000 mg | ORAL | Status: DC | PRN
Start: 2022-06-16 — End: 2022-06-21
  Filled 2022-06-16: qty 15

## 2022-06-16 MED ORDER — ALPRAZOLAM 0.5 MG PO TABS
1.0000 mg | ORAL_TABLET | Freq: Two times a day (BID) | ORAL | Status: DC | PRN
  Administered 2022-06-16: 1 mg
  Filled 2022-06-16: qty 2

## 2022-06-16 MED ORDER — SODIUM CHLORIDE 0.9 % IV SOLN: INTRAVENOUS | Status: DC

## 2022-06-16 MED ORDER — GUAIFENESIN 100 MG/5ML PO LIQD
10.0000 mL | ORAL | Status: DC | PRN
  Administered 2022-06-19: 10 mL
  Filled 2022-06-16: qty 10

## 2022-06-17 ENCOUNTER — Inpatient Hospital Stay (HOSPITAL_COMMUNITY): Payer: Self-pay

## 2022-06-17 DIAGNOSIS — R451 Restlessness and agitation: Secondary | ICD-10-CM

## 2022-06-17 DIAGNOSIS — J690 Pneumonitis due to inhalation of food and vomit: Secondary | ICD-10-CM

## 2022-06-17 DIAGNOSIS — J9621 Acute and chronic respiratory failure with hypoxia: Secondary | ICD-10-CM

## 2022-06-17 LAB — BLOOD GAS, ARTERIAL
Acid-Base Excess: 13.1 mmol/L — ABNORMAL HIGH (ref 0.0–2.0)
Bicarbonate: 40.3 mmol/L — ABNORMAL HIGH (ref 20.0–28.0)
O2 Saturation: 98.7 %
Patient temperature: 36.5
pCO2 arterial: 64 mmHg — ABNORMAL HIGH (ref 32–48)
pH, Arterial: 7.41 (ref 7.35–7.45)
pO2, Arterial: 102 mmHg (ref 83–108)

## 2022-06-17 LAB — CBC WITH DIFFERENTIAL/PLATELET
Abs Immature Granulocytes: 0.12 10*3/uL — ABNORMAL HIGH (ref 0.00–0.07)
Basophils Absolute: 0.1 10*3/uL (ref 0.0–0.1)
Basophils Relative: 0 %
Eosinophils Absolute: 0.2 10*3/uL (ref 0.0–0.5)
Eosinophils Relative: 1 %
HCT: 33.2 % — ABNORMAL LOW (ref 39.0–52.0)
Hemoglobin: 10.3 g/dL — ABNORMAL LOW (ref 13.0–17.0)
Immature Granulocytes: 1 %
Lymphocytes Relative: 4 %
Lymphs Abs: 1 10*3/uL (ref 0.7–4.0)
MCH: 24.9 pg — ABNORMAL LOW (ref 26.0–34.0)
MCHC: 31 g/dL (ref 30.0–36.0)
MCV: 80.2 fL (ref 80.0–100.0)
Monocytes Absolute: 1.2 10*3/uL — ABNORMAL HIGH (ref 0.1–1.0)
Monocytes Relative: 5 %
Neutro Abs: 21.1 10*3/uL — ABNORMAL HIGH (ref 1.7–7.7)
Neutrophils Relative %: 89 %
Platelets: 471 10*3/uL — ABNORMAL HIGH (ref 150–400)
RBC: 4.14 MIL/uL — ABNORMAL LOW (ref 4.22–5.81)
RDW: 13.6 % (ref 11.5–15.5)
WBC: 23.7 10*3/uL — ABNORMAL HIGH (ref 4.0–10.5)
nRBC: 0 % (ref 0.0–0.2)

## 2022-06-17 LAB — POCT I-STAT 7, (LYTES, BLD GAS, ICA,H+H)
Acid-Base Excess: 12 mmol/L — ABNORMAL HIGH (ref 0.0–2.0)
Acid-Base Excess: 11 mmol/L — ABNORMAL HIGH (ref 0.0–2.0)
Bicarbonate: 40.8 mmol/L — ABNORMAL HIGH (ref 20.0–28.0)
Bicarbonate: 37.7 mmol/L — ABNORMAL HIGH (ref 20.0–28.0)
Calcium, Ion: 1.22 mmol/L (ref 1.15–1.40)
Calcium, Ion: 1.17 mmol/L (ref 1.15–1.40)
HCT: 34 % — ABNORMAL LOW (ref 39.0–52.0)
HCT: 27 % — ABNORMAL LOW (ref 39.0–52.0)
Hemoglobin: 11.6 g/dL — ABNORMAL LOW (ref 13.0–17.0)
Hemoglobin: 9.2 g/dL — ABNORMAL LOW (ref 13.0–17.0)
O2 Saturation: 100 %
O2 Saturation: 94 %
Patient temperature: 97.7
Patient temperature: 97.5
Potassium: 4.4 mmol/L (ref 3.5–5.1)
Potassium: 4.6 mmol/L (ref 3.5–5.1)
Sodium: 135 mmol/L (ref 135–145)
Sodium: 134 mmol/L — ABNORMAL LOW (ref 135–145)
TCO2: 43 mmol/L — ABNORMAL HIGH (ref 22–32)
TCO2: 40 mmol/L — ABNORMAL HIGH (ref 22–32)
pCO2 arterial: 80.4 mmHg (ref 32–48)
pCO2 arterial: 62.8 mmHg — ABNORMAL HIGH (ref 32–48)
pH, Arterial: 7.312 — ABNORMAL LOW (ref 7.35–7.45)
pH, Arterial: 7.384 (ref 7.35–7.45)
pO2, Arterial: 315 mmHg — ABNORMAL HIGH (ref 83–108)
pO2, Arterial: 71 mmHg — ABNORMAL LOW (ref 83–108)

## 2022-06-17 LAB — BASIC METABOLIC PANEL
Anion gap: 9 (ref 5–15)
BUN: 11 mg/dL (ref 6–20)
CO2: 33 mmol/L — ABNORMAL HIGH (ref 22–32)
Calcium: 8.8 mg/dL — ABNORMAL LOW (ref 8.9–10.3)
Chloride: 94 mmol/L — ABNORMAL LOW (ref 98–111)
Creatinine, Ser: 0.48 mg/dL — ABNORMAL LOW (ref 0.61–1.24)
GFR, Estimated: >60 mL/min (ref >60)
Glucose, Bld: 122 mg/dL — ABNORMAL HIGH (ref 70–99)
Potassium: 4.2 mmol/L (ref 3.5–5.1)
Sodium: 136 mmol/L (ref 135–145)

## 2022-06-17 LAB — GLUCOSE, CAPILLARY
Glucose-Capillary: 127 mg/dL — ABNORMAL HIGH (ref 70–99)
Glucose-Capillary: 84 mg/dL (ref 70–99)
Glucose-Capillary: 71 mg/dL (ref 70–99)
Glucose-Capillary: 62 mg/dL — ABNORMAL LOW (ref 70–99)
Glucose-Capillary: 67 mg/dL — ABNORMAL LOW (ref 70–99)
Glucose-Capillary: 120 mg/dL — ABNORMAL HIGH (ref 70–99)
Glucose-Capillary: 73 mg/dL (ref 70–99)

## 2022-06-17 LAB — LACTIC ACID, PLASMA: Lactic Acid, Venous: 0.9 mmol/L (ref 0.5–1.9)

## 2022-06-17 LAB — RAPID URINE DRUG SCREEN, HOSP PERFORMED
Amphetamines: POSITIVE — AB
Barbiturates: NOT DETECTED
Benzodiazepines: POSITIVE — AB
Cocaine: NOT DETECTED
Opiates: NOT DETECTED
Tetrahydrocannabinol: NOT DETECTED

## 2022-06-17 LAB — AMMONIA: Ammonia: 27 umol/L (ref 9–35)

## 2022-06-17 MED ORDER — RINGERS IV SOLN: INTRAVENOUS | Status: DC

## 2022-06-17 MED ORDER — POLYETHYLENE GLYCOL 3350 17 G PO PACK
17.0000 g | PACK | Freq: Every day | ORAL | Status: DC
  Administered 2022-06-19 – 2022-06-20 (×2): 17 g
  Filled 2022-06-17 (×3): qty 1

## 2022-06-17 MED ORDER — ORAL CARE MOUTH RINSE
15.0000 mL | OROMUCOSAL | Status: DC
  Administered 2022-06-17 – 2022-06-19 (×23): 15 mL via OROMUCOSAL

## 2022-06-17 MED ORDER — LACTATED RINGERS IV BOLUS
1000.0000 mL | Freq: Once | INTRAVENOUS | Status: AC
  Administered 2022-06-17: 1000 mL via INTRAVENOUS

## 2022-06-17 MED ORDER — FENTANYL BOLUS VIA INFUSION
50.0000 ug | INTRAVENOUS | Status: DC | PRN
  Administered 2022-06-17 (×3): 100 ug via INTRAVENOUS
  Administered 2022-06-17: 50 ug via INTRAVENOUS

## 2022-06-17 MED ORDER — DEXMEDETOMIDINE HCL IN NACL 400 MCG/100ML IV SOLN
0.4000 ug/kg/h | INTRAVENOUS | Status: DC
  Administered 2022-06-17 (×2): 1.2 ug/kg/h via INTRAVENOUS
  Administered 2022-06-17: 0.5 ug/kg/h via INTRAVENOUS
  Administered 2022-06-17: 1.2 ug/kg/h via INTRAVENOUS
  Filled 2022-06-17 (×4): qty 100

## 2022-06-17 MED ORDER — DOCUSATE SODIUM 50 MG/5ML PO LIQD: 100.0000 mg | Freq: Two times a day (BID) | ORAL | Status: DC

## 2022-06-17 MED ORDER — ZIPRASIDONE MESYLATE 20 MG IM SOLR
10.0000 mg | Freq: Once | INTRAMUSCULAR | Status: AC
  Administered 2022-06-17: 10 mg via INTRAMUSCULAR
  Filled 2022-06-17: qty 20

## 2022-06-17 MED ORDER — NALOXONE HCL 0.4 MG/ML IJ SOLN
INTRAMUSCULAR | Status: AC
  Administered 2022-06-17: 0.4 mg
  Filled 2022-06-17: qty 1

## 2022-06-17 MED ORDER — OSMOLITE 1.5 CAL PO LIQD: ORAL | Status: DC

## 2022-06-17 MED ORDER — MIDAZOLAM HCL 2 MG/2ML IJ SOLN
INTRAMUSCULAR | Status: AC
  Administered 2022-06-17: 2 mg
  Filled 2022-06-17: qty 2

## 2022-06-17 MED ORDER — FENTANYL 2500MCG IN NS 250ML (10MCG/ML) PREMIX INFUSION
0.0000 ug/h | INTRAVENOUS | Status: DC
  Administered 2022-06-17: 400 ug/h via INTRAVENOUS
  Administered 2022-06-17: 200 ug/h via INTRAVENOUS
  Administered 2022-06-17: 400 ug/h via INTRAVENOUS
  Administered 2022-06-18: 300 ug/h via INTRAVENOUS
  Administered 2022-06-18: 400 ug/h via INTRAVENOUS
  Administered 2022-06-18 – 2022-06-19 (×2): 300 ug/h via INTRAVENOUS
  Filled 2022-06-17 (×7): qty 250

## 2022-06-17 MED ORDER — MIDAZOLAM HCL 2 MG/2ML IJ SOLN
2.0000 mg | INTRAMUSCULAR | Status: DC | PRN
  Administered 2022-06-18 (×3): 2 mg via INTRAVENOUS
  Filled 2022-06-17 (×3): qty 2

## 2022-06-17 MED ORDER — ORAL CARE MOUTH RINSE: 15.0000 mL | OROMUCOSAL | Status: DC | PRN

## 2022-06-17 MED ORDER — DEXTROSE 50 % IV SOLN
INTRAVENOUS | Status: AC
  Administered 2022-06-17: 50 mL via INTRAVENOUS
  Filled 2022-06-17: qty 50

## 2022-06-17 MED ORDER — ROCURONIUM BROMIDE 10 MG/ML (PF) SYRINGE
PREFILLED_SYRINGE | INTRAVENOUS | Status: AC
  Filled 2022-06-17: qty 10

## 2022-06-17 MED ORDER — FENTANYL CITRATE PF 50 MCG/ML IJ SOSY
PREFILLED_SYRINGE | INTRAMUSCULAR | Status: AC
  Filled 2022-06-17: qty 2

## 2022-06-17 MED ORDER — MIDAZOLAM HCL 2 MG/2ML IJ SOLN
INTRAMUSCULAR | Status: DC
Start: 2022-06-17 — End: 2022-06-17
  Filled 2022-06-17: qty 2

## 2022-06-17 MED ORDER — SUCCINYLCHOLINE CHLORIDE 200 MG/10ML IV SOSY
PREFILLED_SYRINGE | INTRAVENOUS | Status: DC
Start: 2022-06-17 — End: 2022-06-17
  Filled 2022-06-17: qty 10

## 2022-06-17 MED ORDER — MIDAZOLAM-SODIUM CHLORIDE 100-0.9 MG/100ML-% IV SOLN
0.0000 mg/h | INTRAVENOUS | Status: DC
  Administered 2022-06-18: 5 mg/h via INTRAVENOUS
  Administered 2022-06-18: 1 mg/h via INTRAVENOUS
  Filled 2022-06-17 (×2): qty 100

## 2022-06-17 MED ORDER — ETOMIDATE 2 MG/ML IV SOLN
INTRAVENOUS | Status: AC
  Administered 2022-06-17: 20 mg
  Filled 2022-06-17: qty 10

## 2022-06-17 MED ORDER — DEXTROSE 50 % IV SOLN: 50.0000 mL | Freq: Once | INTRAVENOUS | Status: AC

## 2022-06-17 MED ORDER — MIDAZOLAM BOLUS VIA INFUSION: 0.0000 mg | INTRAVENOUS | Status: DC | PRN

## 2022-06-17 MED ORDER — CHLORHEXIDINE GLUCONATE CLOTH 2 % EX PADS
6.0000 | MEDICATED_PAD | Freq: Every day | CUTANEOUS | Status: DC
  Administered 2022-06-17 – 2022-06-20 (×4): 6 via TOPICAL

## 2022-06-17 MED ORDER — ROCURONIUM BROMIDE 10 MG/ML (PF) SYRINGE
PREFILLED_SYRINGE | INTRAVENOUS | Status: AC
  Administered 2022-06-17: 50 mg
  Filled 2022-06-17: qty 10

## 2022-06-17 MED ORDER — METOCLOPRAMIDE HCL 5 MG/ML IJ SOLN
10.0000 mg | Freq: Four times a day (QID) | INTRAMUSCULAR | Status: AC
Start: 2022-06-18 — End: 2022-06-19
  Administered 2022-06-18 – 2022-06-19 (×5): 10 mg via INTRAVENOUS
  Filled 2022-06-17 (×5): qty 2

## 2022-06-17 MED ORDER — ETOMIDATE 2 MG/ML IV SOLN
INTRAVENOUS | Status: DC
Start: 2022-06-17 — End: 2022-06-17
  Filled 2022-06-17: qty 20

## 2022-06-17 MED ORDER — NALOXONE HCL 0.4 MG/ML IJ SOLN
0.4000 mg | Freq: Once | INTRAMUSCULAR | Status: AC
  Administered 2022-06-17: 0.4 mg via INTRAVENOUS

## 2022-06-17 MED ORDER — FENTANYL CITRATE PF 50 MCG/ML IJ SOSY
50.0000 ug | PREFILLED_SYRINGE | Freq: Once | INTRAMUSCULAR | Status: AC
  Administered 2022-06-17: 50 ug via INTRAVENOUS
  Filled 2022-06-17: qty 1

## 2022-06-17 NOTE — Progress Notes (Signed)
RN rounding on patient found patient at the edge of the bed, slumped over. Patient noted not responding and agonal breathing. Rapid Response called to take a second look at patient. VS obtained. Pt de-sating to mid 80s on 6 L.  A non-re breather  mask applied.

## 2022-06-17 NOTE — Progress Notes (Signed)
eLink Physician-Brief Progress Note Patient Name: [name] DOB: 1981-05-31 MRN: [id number]   Date of Service  06/17/2022  HPI/Events of Note  Notified of SBP 80s I/Os show pt to be negative 4L since admission  eICU Interventions  1 Liter fluid bolus ordered.   Tube feeds had been held yesterday due to concern for aspiration.  Will resume tube feeds Would also start metoclopramide to help with gastric emptying.  Qtc 430        [name] M DELA CRUZ 06/17/2022, 11:36 PM

## 2022-06-18 ENCOUNTER — Encounter (HOSPITAL_COMMUNITY): Payer: Self-pay | Admitting: Family Medicine

## 2022-06-18 LAB — BASIC METABOLIC PANEL
Anion gap: 8 (ref 5–15)
BUN: 18 mg/dL (ref 6–20)
CO2: 32 mmol/L (ref 22–32)
Calcium: 7.7 mg/dL — ABNORMAL LOW (ref 8.9–10.3)
Chloride: 93 mmol/L — ABNORMAL LOW (ref 98–111)
Creatinine, Ser: 0.44 mg/dL — ABNORMAL LOW (ref 0.61–1.24)
GFR, Estimated: >60 mL/min (ref >60)
Glucose, Bld: 76 mg/dL (ref 70–99)
Potassium: 4.5 mmol/L (ref 3.5–5.1)
Sodium: 133 mmol/L — ABNORMAL LOW (ref 135–145)

## 2022-06-18 LAB — GLUCOSE, CAPILLARY
Glucose-Capillary: 107 mg/dL — ABNORMAL HIGH (ref 70–99)
Glucose-Capillary: 116 mg/dL — ABNORMAL HIGH (ref 70–99)
Glucose-Capillary: 65 mg/dL — ABNORMAL LOW (ref 70–99)
Glucose-Capillary: 78 mg/dL (ref 70–99)
Glucose-Capillary: 89 mg/dL (ref 70–99)
Glucose-Capillary: 90 mg/dL (ref 70–99)
Glucose-Capillary: 94 mg/dL (ref 70–99)

## 2022-06-18 LAB — CBC
HCT: 25.1 % — ABNORMAL LOW (ref 39.0–52.0)
HCT: 31.6 % — ABNORMAL LOW (ref 39.0–52.0)
Hemoglobin: 7.8 g/dL — ABNORMAL LOW (ref 13.0–17.0)
Hemoglobin: 9.7 g/dL — ABNORMAL LOW (ref 13.0–17.0)
MCH: 25.3 pg — ABNORMAL LOW (ref 26.0–34.0)
MCH: 25.3 pg — ABNORMAL LOW (ref 26.0–34.0)
MCHC: 30.7 g/dL (ref 30.0–36.0)
MCHC: 31.1 g/dL (ref 30.0–36.0)
MCV: 81.5 fL (ref 80.0–100.0)
MCV: 82.5 fL (ref 80.0–100.0)
Platelets: 382 10*3/uL (ref 150–400)
Platelets: 516 10*3/uL — ABNORMAL HIGH (ref 150–400)
RBC: 3.08 MIL/uL — ABNORMAL LOW (ref 4.22–5.81)
RBC: 3.83 MIL/uL — ABNORMAL LOW (ref 4.22–5.81)
RDW: 13.7 % (ref 11.5–15.5)
RDW: 13.9 % (ref 11.5–15.5)
WBC: 17.3 10*3/uL — ABNORMAL HIGH (ref 4.0–10.5)
WBC: 17.7 10*3/uL — ABNORMAL HIGH (ref 4.0–10.5)
nRBC: 0 % (ref 0.0–0.2)
nRBC: 0 % (ref 0.0–0.2)

## 2022-06-18 LAB — PHOSPHORUS: Phosphorus: 4.3 mg/dL (ref 2.5–4.6)

## 2022-06-18 LAB — MAGNESIUM: Magnesium: 2 mg/dL (ref 1.7–2.4)

## 2022-06-18 MED ORDER — NOREPINEPHRINE 4 MG/250ML-% IV SOLN
2.0000 ug/min | INTRAVENOUS | Status: DC
  Administered 2022-06-18: 2 ug/min via INTRAVENOUS
  Administered 2022-06-18: 5 ug/min via INTRAVENOUS
  Filled 2022-06-18 (×2): qty 250

## 2022-06-18 MED ORDER — OSMOLITE 1.2 CAL PO LIQD
1000.0000 mL | ORAL | Status: DC
  Administered 2022-06-18 – 2022-06-20 (×3): 1000 mL
  Filled 2022-06-18 (×4): qty 1000

## 2022-06-18 MED ORDER — METHADONE HCL 10 MG PO TABS
10.0000 mg | ORAL_TABLET | Freq: Every day | ORAL | Status: DC
  Administered 2022-06-18 – 2022-06-19 (×2): 10 mg
  Filled 2022-06-18 (×2): qty 1

## 2022-06-18 MED ORDER — DEXTROSE 50 % IV SOLN
12.5000 g | INTRAVENOUS | Status: AC
Start: 2022-06-18 — End: 2022-06-18

## 2022-06-18 MED ORDER — OSMOLITE 1.5 CAL PO LIQD: 1000.0000 mL | ORAL | Status: DC

## 2022-06-18 MED ORDER — LACTATED RINGERS IV SOLN: INTRAVENOUS | Status: DC

## 2022-06-18 MED ORDER — DEXTROSE 50 % IV SOLN
INTRAVENOUS | Status: AC
  Administered 2022-06-18: 12.5 g via INTRAVENOUS
  Filled 2022-06-18: qty 50

## 2022-06-18 MED ORDER — PROSOURCE TF PO LIQD
45.0000 mL | Freq: Two times a day (BID) | ORAL | Status: DC
  Administered 2022-06-18 – 2022-06-20 (×5): 45 mL
  Filled 2022-06-18 (×5): qty 45

## 2022-06-18 MED ORDER — FAMOTIDINE 40 MG/5ML PO SUSR
20.0000 mg | Freq: Two times a day (BID) | ORAL | Status: DC
Start: 2022-06-18 — End: 2022-06-20
  Administered 2022-06-18 – 2022-06-20 (×5): 20 mg
  Filled 2022-06-18 (×5): qty 2.5

## 2022-06-18 MED ORDER — SODIUM CHLORIDE 0.9 % IV SOLN
250.0000 mL | INTRAVENOUS | Status: DC
  Administered 2022-06-18: 250 mL via INTRAVENOUS

## 2022-06-19 ENCOUNTER — Other Ambulatory Visit (HOSPITAL_COMMUNITY): Payer: Self-pay

## 2022-06-19 DIAGNOSIS — J189 Pneumonia, unspecified organism: Secondary | ICD-10-CM

## 2022-06-19 DIAGNOSIS — Z515 Encounter for palliative care: Secondary | ICD-10-CM

## 2022-06-19 DIAGNOSIS — Z7189 Other specified counseling: Secondary | ICD-10-CM

## 2022-06-19 LAB — GLUCOSE, CAPILLARY
Glucose-Capillary: 109 mg/dL — ABNORMAL HIGH (ref 70–99)
Glucose-Capillary: 108 mg/dL — ABNORMAL HIGH (ref 70–99)
Glucose-Capillary: 103 mg/dL — ABNORMAL HIGH (ref 70–99)
Glucose-Capillary: 95 mg/dL (ref 70–99)
Glucose-Capillary: 85 mg/dL (ref 70–99)

## 2022-06-19 MED ORDER — BUPRENORPHINE HCL-NALOXONE HCL 2-0.5 MG SL SUBL
2.0000 | SUBLINGUAL_TABLET | SUBLINGUAL | Status: AC | PRN
  Administered 2022-06-19 – 2022-06-20 (×2): 2 via SUBLINGUAL
  Filled 2022-06-19 (×2): qty 2

## 2022-06-19 MED ORDER — TESTOSTERONE 4 MG/24HR TD PT24
1.0000 | MEDICATED_PATCH | Freq: Every day | TRANSDERMAL | Status: DC
  Administered 2022-06-19 – 2022-06-20 (×2): 1 via TRANSDERMAL
  Filled 2022-06-19 (×2): qty 1

## 2022-06-19 MED ORDER — BUPRENORPHINE HCL-NALOXONE HCL 8-2 MG SL SUBL
1.0000 | SUBLINGUAL_TABLET | Freq: Two times a day (BID) | SUBLINGUAL | Status: DC
  Administered 2022-06-20: 1 via SUBLINGUAL
  Filled 2022-06-19: qty 1

## 2022-06-19 NOTE — Progress Notes (Signed)
This chaplain responded to the spiritual care consult for presence and assessment of spiritual care needs. This chaplain reviewed the Pt. chart and was updated by PMT NP-Alicia and RN-Jennifer. The Pt. is sitting up in bed.  The Pt. inquires about his medication upon the chaplain's arrival. The Pt. accepted the RN will bring the medication when it arrives on the unit.  The chaplain listened reflectively as the Pt. described his desire for happiness. The Pt describes happiness in the context of a dog he once had in a relationship.  The Pt. describes himself as a spiritual being with the hope prayer will bring him the happiness he is looking for.  The Pt. is willing to accept happiness may be a journey instead of an isolated event. The Pt. chooses to rest and end the visit with prayer.  This chaplain is available for F/U spiritual care as needed.  Chaplain Stephanie Acre [phone]

## 2022-06-19 NOTE — Assessment & Plan Note (Signed)
 Completed course of antibiotics

## 2022-06-19 NOTE — Consult Note (Signed)
Consultation Note Date: 06/19/2022   Patient Name: [name]  DOB: 1981-09-23  MRN: [id number]  Age / Sex: 41 y.o., male  PCP: Pcp, No Referring Physician: Kipp Brood, MD  Reason for Consultation: Establishing goals of care  HPI/Patient Profile: 41 y.o. 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 He was previously on 3L oxygen but has not had oxygen last couple days prior to admission and he has lost 47 lbs. Recently moved from [place]. Decline during hospitalization with concern that for substance abuse resulting in need for intubation. PEG placed during hospitalization as well.   Clinical Assessment and Goals of Care: I met today after reviewing records and discussing with RN Anderson Malta and Dr. Joylene Draft. I spoke more with Hilliard Clark as he is recently extubated and is alert and oriented. I introduced myself as palliative care nurse practitioner and that I help patients dealing with serious illness to help determine what is most important to them so that we can align their care with their priorities and help determine how best to move forward. Ramiel shares with me that he moved here from [place] to stay with 2 friends he has with a goal to get cleaned up and sober. He reports that he knew he had to get out of [place] to have a chance of recovery. He tells me that his friends are clean and can help him. He reports that most important to him is his recovery and he expresses desire to be connected with a suboxone clinic. He is  not interested in inpatient rehabilitation services at this time but desires outpatient resources and says he would be open to consider inpatient in the future once he is more settled.   Joeph shares that next important to him is food and being allowed to eat/drink as he is a Biomedical scientist by trade. He enjoys cooking Asian cuisine. I had an open and honest conversation with Wyatt about the risk of eating and drinking with aspiration and that this can lead to recurrent decline and even death. He shares that these are risks he is willing to take as he is unwilling to live his life without being able to eat and drink. However, at the same time he also requests that we do everything to help him live as long as possible including repeated ventilator support. I encouraged that we need to meet each other halfway and work with SLP to see if there is anything we can do to make intake any safer for him. He is willing to work with them but very clear on his intentions to have intake and diet regardless of recommendations.   I discussed in more detail his frail status and recurrent  pneumonias and complications. We discussed his medical decline in relation to his polysubstance abuse. He understands this correlation. He reports that he does not want to die and does not like to think about this. He reiterates that he would not want to live a life where he would be unable to eat - I correlate this with prolonged ventilator support and if this is something he would desire but all he can tell me is that he would not want to live if he cannot eat. He would not be able to eat if he were to have tracheostomy or prolonged ventilator support so I do not believe these interventions would align with his stated goals of care. This was his only limitation in his plan of care. He confirms that his mother would be his surrogate decision maker. I offered [place] chaplain support and he very quickly accepted noting that he is of [place]. He is very  interested in obtaining resources from Education officer, museum to help support his goal of recovery.   All questions/concerns addressed. Emotional support provided.   Primary Decision Maker PATIENT    SUMMARY OF RECOMMENDATIONS   - Main goal is to eat and pursue recovery from his substance abuse - Full code/full scope - Would not want to live a life where he would not be able to eat and enjoy food  Code Status/Advance Care Planning: Full code   Symptom Management:  Per PCCM.   Palliative Prophylaxis:  Aspiration, Bowel Regimen, Delirium Protocol, Oral Care, and Turn Reposition  Additional Recommendations (Limitations, Scope, Preferences): Full Scope Treatment  Psycho-social/Spiritual:  Desire for further Chaplaincy support:yes Additional Recommendations: Caregiving  Support/Resources and Referral to Community Resources   Prognosis:  Overall prognosis poor with failure to thrive.   Discharge Planning: To Be Determined      Primary Diagnoses: Present on Admission:  Left upper lobe pneumonia  Opiate dependence, continuous (HCC)  Tobacco dependence  Protein-calorie malnutrition, severe ([place])   I have reviewed the medical record, interviewed the patient and family, and examined the patient. The following aspects are pertinent.  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   following surgery for pharyngeal abscess   Depression    Diverticulitis    Drug abuse (HCC)    Hepatitis C    MRSA (methicillin resistant staph aureus) culture positive    Pharyngeal abscess 12/2021   Social History   Socioeconomic History   Marital status: Legally Separated    Spouse name: Not on file   Number of children: Not on file   Years of education: Not on file   Highest education level: Not on file  Occupational History   Occupation: plumbing  Tobacco Use   Smoking status: Every Day     Packs/day: 1.00    Years: 18.00    Total pack years: 18.00    Types: Cigarettes, E-cigarettes   Smokeless tobacco: Never   Tobacco comments:    Requests nicotine patch  Substance and Sexual Activity   Alcohol use: No   Drug use: Yes    Frequency: 1.0 times per week    Types: Marijuana, Methamphetamines, Heroin    Comment: heroin use remotely (last 10 years ago), amphetamines within the month, uses marijuana daily   Sexual activity: Not Currently  Other Topics Concern   Not on file  Social History Narrative  Not on file   Social Determinants of Health   Financial Resource Strain: Not on file  Food Insecurity: Not on file  Transportation Needs: Not on file  Physical Activity: Not on file  Stress: Not on file  Social Connections: Not on file   History reviewed. No pertinent family history. Scheduled Meds:  ARIPiprazole  5 mg Per Tube Daily   Chlorhexidine Gluconate Cloth  6 each Topical Daily   docusate  100 mg Per Tube BID   enoxaparin (LOVENOX) injection  40 mg Subcutaneous Q24H   famotidine  20 mg Per Tube BID   feeding supplement (PROSource TF)  45 mL Per Tube BID   FLUoxetine  80 mg Per Tube Daily   methadone  10 mg Per Tube Daily   metoCLOPramide (REGLAN) injection  10 mg Intravenous Q6H   multivitamin with minerals  1 tablet Per Tube Daily   nicotine  21 mg Transdermal Daily   mouth rinse  15 mL Mouth Rinse Q2H   polyethylene glycol  17 g Per Tube Daily   propranolol  20 mg Per Tube BID   sodium chloride flush  3 mL Intravenous Q12H   Continuous Infusions:  sodium chloride Stopped (06/19/22 0558)   dexmedetomidine (PRECEDEX) IV infusion Stopped (06/18/22 0239)   feeding supplement (OSMOLITE 1.2 CAL) 60 mL/hr at 06/19/22 0705   fentaNYL infusion INTRAVENOUS 200 mcg/hr (06/19/22 0705)   lactated ringers Stopped (06/18/22 1730)   midazolam 2 mg/hr (06/19/22 0705)   norepinephrine (LEVOPHED) Adult infusion Stopped (06/19/22 0502)   PRN Meds:.acetaminophen  **OR** acetaminophen, albuterol, bisacodyl, fentaNYL, guaiFENesin, hydrALAZINE, loperamide HCl, midazolam, ondansetron **OR** ondansetron (ZOFRAN) IV, polyethylene glycol No Known Allergies Review of Systems  Constitutional:  Positive for activity change and unexpected weight change. Negative for appetite change.  Neurological:  Positive for weakness.    Physical Exam Constitutional:      General: He is not in acute distress.    Appearance: He is cachectic. He is ill-appearing.  Cardiovascular:     Rate and Rhythm: Normal rate.  Pulmonary:     Effort: No tachypnea, accessory muscle usage or respiratory distress.  Abdominal:     General: Abdomen is flat.  Neurological:     Mental Status: He is alert and oriented to person, place, and time.     Vital Signs: BP 106/71   Pulse 82   Temp 98.6 F (37 C) (Oral)   Resp (!) 26   Ht _0  (1.753 m)   Wt 50 kg   SpO2 97%   BMI 16.28 kg/m  Pain Scale: CPOT POSS *See Group Information*: 1-Acceptable,Awake and alert Pain Score: 0-No pain   SpO2: SpO2: 97 % O2 Device:SpO2: 97 % O2 Flow Rate: .O2 Flow Rate (L/min): 5 L/min  IO: Intake/output summary:  Intake/Output Summary (Last 24 hours) at 06/19/2022 1055 Last data filed at 06/19/2022 2979 Gross per 24 hour  Intake 4158.59 ml  Output 2350 ml  Net 1808.59 ml    LBM: Last BM Date : 06/14/22 Baseline Weight: Weight: 52 kg Most recent weight: Weight: 50 kg     Palliative Assessment/Data:     Time In: 1200  Time Total: 55 min  Greater than 50%  of this time was spent counseling and coordinating care related to the above assessment and plan.  Signed by: Vinie Sill, NP Palliative Medicine Team Pager # [phone] (M-F 8a-5p) Team Phone # [phone] (Nights/Weekends)

## 2022-06-19 NOTE — Assessment & Plan Note (Signed)
No signs of narcotic withdrawal.  - Continue methadone at current dose.

## 2022-06-19 NOTE — Assessment & Plan Note (Addendum)
PEG tube for feeding   - Strict NPO recommended and modified diet tolerated. Patient has stated that will continue to eat without restriction irrespective of potential consequences including recurrent need for intubation and death.

## 2022-06-20 ENCOUNTER — Other Ambulatory Visit (HOSPITAL_COMMUNITY): Payer: Self-pay

## 2022-06-20 LAB — GLUCOSE, CAPILLARY
Glucose-Capillary: 100 mg/dL — ABNORMAL HIGH (ref 70–99)
Glucose-Capillary: 115 mg/dL — ABNORMAL HIGH (ref 70–99)
Glucose-Capillary: 117 mg/dL — ABNORMAL HIGH (ref 70–99)
Glucose-Capillary: 87 mg/dL (ref 70–99)
Glucose-Capillary: 89 mg/dL (ref 70–99)

## 2022-06-20 MED ORDER — ORAL CARE MOUTH RINSE
15.0000 mL | OROMUCOSAL | Status: DC
  Administered 2022-06-20 (×3): 15 mL via OROMUCOSAL

## 2022-06-20 MED ORDER — OSMOLITE 1.2 CAL PO LIQD
237.0000 mL | Freq: Four times a day (QID) | ORAL | 0 refills | Status: AC
  Filled 2022-06-20: qty 237, 1d supply, fill #0

## 2022-06-20 MED ORDER — ACETAMINOPHEN 325 MG PO TABS: 650.0000 mg | ORAL_TABLET | Freq: Four times a day (QID) | ORAL | Status: DC | PRN

## 2022-06-20 MED ORDER — FREE WATER
60.0000 mL | Freq: Three times a day (TID) | Status: DC
  Administered 2022-06-20 (×2): 60 mL

## 2022-06-20 MED ORDER — GUAIFENESIN 100 MG/5ML PO LIQD: 10.0000 mL | ORAL | Status: DC | PRN

## 2022-06-20 MED ORDER — ARIPIPRAZOLE 5 MG PO TABS: 5.0000 mg | ORAL_TABLET | Freq: Every day | ORAL | Status: DC

## 2022-06-20 MED ORDER — DOCUSATE SODIUM 100 MG PO CAPS: 100.0000 mg | ORAL_CAPSULE | Freq: Two times a day (BID) | ORAL | Status: DC

## 2022-06-20 MED ORDER — PROPRANOLOL HCL 20 MG PO TABS
20.0000 mg | ORAL_TABLET | Freq: Two times a day (BID) | ORAL | Status: DC
  Administered 2022-06-20: 20 mg via ORAL
  Filled 2022-06-20: qty 1

## 2022-06-20 MED ORDER — LOPERAMIDE HCL 1 MG/7.5ML PO SUSP
2.0000 mg | ORAL | 0 refills | Status: AC | PRN
  Filled 2022-06-20: qty 120, 8d supply, fill #0

## 2022-06-20 MED ORDER — ORAL CARE MOUTH RINSE: 15.0000 mL | OROMUCOSAL | Status: DC | PRN

## 2022-06-20 MED ORDER — BUPRENORPHINE HCL-NALOXONE HCL 8-2 MG SL SUBL: 1.0000 | SUBLINGUAL_TABLET | Freq: Two times a day (BID) | SUBLINGUAL | 1 refills | Status: AC

## 2022-06-20 MED ORDER — POLYETHYLENE GLYCOL 3350 17 G PO PACK: 17.0000 g | PACK | Freq: Every day | ORAL | Status: DC | PRN

## 2022-06-20 MED ORDER — POLYETHYLENE GLYCOL 3350 17 G PO PACK: 17.0000 g | PACK | Freq: Every day | ORAL | Status: DC

## 2022-06-20 MED ORDER — FLUOXETINE HCL 40 MG PO CAPS
80.0000 mg | ORAL_CAPSULE | Freq: Every day | ORAL | 1 refills | Status: AC
  Filled 2022-06-20: qty 60, 30d supply, fill #0

## 2022-06-20 MED ORDER — FLUOXETINE HCL 20 MG PO CAPS: 80.0000 mg | ORAL_CAPSULE | Freq: Every day | ORAL | Status: DC

## 2022-06-20 MED ORDER — ADULT MULTIVITAMIN W/MINERALS CH
1.0000 | ORAL_TABLET | Freq: Every day | ORAL | Status: DC
Start: 2022-06-21 — End: 2022-06-21

## 2022-06-20 MED ORDER — ARIPIPRAZOLE 5 MG PO TABS
5.0000 mg | ORAL_TABLET | Freq: Every day | ORAL | 1 refills | Status: AC
  Filled 2022-06-20: qty 30, 30d supply, fill #0

## 2022-06-20 MED ORDER — FAMOTIDINE 20 MG PO TABS
20.0000 mg | ORAL_TABLET | Freq: Two times a day (BID) | ORAL | Status: DC
  Administered 2022-06-20: 20 mg via ORAL
  Filled 2022-06-20: qty 1

## 2022-06-20 MED ORDER — OSMOLITE 1.5 CAL PO LIQD
237.0000 mL | Freq: Three times a day (TID) | ORAL | Status: DC
  Administered 2022-06-20 (×2): 237 mL
  Filled 2022-06-20: qty 237

## 2022-06-20 MED ORDER — PROSOURCE TF PO LIQD
45.0000 mL | Freq: Two times a day (BID) | ORAL | 60 refills | Status: AC
  Filled 2022-06-20: qty 45, 1d supply, fill #0

## 2022-06-20 MED ORDER — ACETAMINOPHEN 650 MG RE SUPP: 650.0000 mg | Freq: Four times a day (QID) | RECTAL | Status: DC | PRN

## 2022-06-20 NOTE — Plan of Care (Signed)
  Problem: Activity: Goal: Ability to tolerate increased activity will improve Outcome: Progressing   Problem: Respiratory: Goal: Ability to maintain adequate ventilation will improve Outcome: Progressing Goal: Ability to maintain a clear airway will improve Outcome: Progressing   Problem: Education: Goal: Knowledge of General Education information will improve Description: Including pain rating scale, medication(s)/side effects and non-pharmacologic comfort measures Outcome: Progressing   

## 2022-06-20 NOTE — Progress Notes (Signed)
Nutrition Brief Note  Patient is being discharged home today. He is currently on a regular diet with thin liquids and known risk of aspiration. Home TF recommendations:  Osmolite 1.5 237 ml TID (10am, 2pm, 6pm) via G-tube (RD has discussed gravity feedings with patient). Free water flushes 30 ml before and after each gravity feeding.   Gabriel Rainwater RD, LDN, CNSC Please refer to Amion for contact information.

## 2022-06-20 NOTE — Progress Notes (Signed)
NAME:  [name], MRN:  [id number], DOB:  [birth year], LOS: 12 ADMISSION DATE:  06/07/2022, CONSULTATION DATE:  6/25 REFERRING MD:  Opid, CHIEF COMPLAINT:  Acute hypoxic hypercapnic respiratory failure   History of Present Illness:  [name] is an 41 y.o. M, with a past medical history of polysubstance abuse (cocaine, meth, heroin) now on Suboxone, anxiety, depression, history of retropharyngeal abscess, hepatitis C.  He presented to [facility] on 6/15.  Was initially admitted to the [facility] service for hypoxic respiratory failure.  PCCM was consulted on 6/19.  SLP and ID consults were recommended.    PCCM was called again to the bedside on 6/25 for concerns of acute respiratory failure, altered mental status.  Suboxone and Paxil down the patient's bag. ABG 7.41/64/102/40.3.  She was started on nonrebreather.  Patient went from being minimally responsive to acutely agitated.    Transfer to ICU was initiated.   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 Hep C s/p treatment with last viral load undetectable Prior diverticulitis, colitis, R forearm abscess and R leg cellulitis  Significant Hospital Events: Including procedures, antibiotic start and stop dates in addition to other pertinent events   6/15 Admitted with recurrent likely aspiration pneumonia in [facility] 6/16 started Azithromycin, Cefepime and Flagyl, then transitioned to Unasyn alone on 6/19, then Augmentin 6/21-6/23 6/17-6/26 completed 10 day course Fluconazole 6/21 had PEG tube placed with IR for chronic dysphagia and recurrent aspiration pneumonia  6/23 Restarted Unasyn for worsening O2 requirement and cancelled planned discharge of 6/23; repeat CXR with interval development of RUL likely new aspiration pneumonia 6/25 Morning found to have new AMS unresponsive to sternal rub and  worsened hypoxia, was intubated. Felt 2/2 ingestion after paxil, suboxone, and vapes found in patient's backpack (had smoked meth in [place] 10 days prior, uncertain what was in vapes). 6/26 Morning was hypotensive to 68/44, unresponsive to 4 x 1L LR boluses. Began Levo at 66mcg/min through PIV with normalization of BP. Discontinued Precedex and added Midazolam to Fentanyl gtt.  6/27 Off pressors, extubated, completed antibiotic course 6/16-6/27  Interim History / Subjective:   [name] notes that his most bothersome complaint this morning is not being able to eat by mouth. He noted at one point this morning that he would leave AMA if he wasn't able to eat, even after multiple attempts at explanation of his likelihood of recurrent aspiration and need for formal evaluation with speech therapy. He was seen by speech therapy and underwent FEES assessment with recommendations pending.  Team had frank conversation with patient about rationale and importance of adhering to diet recommendations. Pt repeatedly adamant about his decision "I'm not going to not eat what I want," and maintained this decision when faced with the concern of possible death from recurrent aspiration events. Patient decided DNI/DNR was what he wanted at this time, and he did see palliative care yesterday as well.   Besides his desire to eat by mouth, the patient denies any other concerns. He denies SOB, chest pain, abdominal pain, or light headedness.   Objective   Blood pressure (!) 127/96, pulse 66, temperature 97.9 F (36.6 C), temperature source Oral, resp. rate (!) 25, height 5\' 9"  (1.753 m), weight 50 kg, SpO2 94 %.        Intake/Output Summary (Last 24 hours) at 06/20/2022 1107 Last data filed at 06/20/2022 1000 Gross per  24 hour  Intake 2877.64 ml  Output 2325 ml  Net 552.64 ml    Filed Weights   06/12/22 0212 06/18/22 0500 06/19/22 0600  Weight: 52.7 kg 47.2 kg 50 kg    Examination: General: cachectic and  chronically ill-appearing HENT: normocephalic, atraumatic; pupils equal, round and reactive to light Lungs: clear to auscultation bilaterally Cardiovascular: RRR without murmurs, rubs or gallops Abdomen: PEG tube in situ with dressing, soft without guarding or distension Extremities: low muscle mass without rashes or wounds Neuro: PERRL, alert, following commands and moving all four extremities purposefully and equally GU:  wearing catheter  Resolved Hospital Problem list   Acute on chronic respiratory failure (resolved) Aspiration pneumonia (resolved) Acute toxic encephalopathy (resolved) Hypotension (resolved)  Assessment & Plan:    Cachexia  Severe protein calorie malnutrition  Dysphagia 2/2 chronic dysphagia. Now s/p PEG placement on 6/21. Pt has had recurrent aspiration pneumonias in the setting of polysubstance abuse since at least 2017. Also had more recent complicating factor of January 2023 surgery for a retropharyngeal abscess that may have damaged CN IX and X, further complicating his dysphagia. Nutritional management following. Receiving Osmolite 1.2 cal at 60mL/hr, titrating to goal 81mL/hr and added on Prosource 67mL BID. Speech therapy is following as well and pt was evaluated on 6/28 with FEES assessment with formal recs to follow. Pt adamant about desire to continue eating what he wants despite the significant concerns for his health if this decision is maintained upon discharge.  Will need home health and was evaluated by PT with this recommendation. -Continue Osmolite and Prosource through PEG tube.  Polysubstance abuse  Anxiety and Depression Appears to be continuing active use based on 6/25 and 6/16 UDS which were both positive for amphetamines. The patient's polysubstance abuse diagnosis may very well be a life-limiting diagnosis for this patient given recurrent overdoses with continued use that has resulted in intubation and multiple aspiration pneumonias. Consulted  Palliative care who saw the patient on 6/27 and appreciate their help in caring for this patient. After further discussion with the patient today, he wanted to sign a DNR/DNI form and reiterated his decision to "eat if it kills me." As far as social aspects of care, the address listed for the patient is the home of a couple who are his friends that he met when he lived in [place] 10 years ago, before leaving to [place]. He has reconnected with them and notes that they will help him to get clean. Declines desire for inpatient rehabilitation at this time but wants to be connected to a methadone clinic.  -Abilify 5mg  daily -Hydralazine 5mg  q4hr prn -Fluoxetine 80mg  daily -Nicotine patch -Suboxone 8-2mg  BID    Best Practice (right click and "Reselect all SmartList Selections" daily)   Diet/type: Osmolite via gastrostomy tube. Nil per os.  DVT prophylaxis: LMWH GI prophylaxis: H2B Lines: Peripheral lines Foley:  Yes, and it is still needed Code Status:  Pt signed DNR/DNI as of 6/28 Last date of multidisciplinary goals of care discussion [pending]  Labs   CBC: Recent Labs  Lab 06/14/22 0940 06/16/22 0157 06/17/22 0226 06/17/22 1003 06/17/22 2034 06/18/22 0035 06/18/22 0632  WBC 11.6* 15.7* 23.7*  --   --  17.3* 17.7*  NEUTROABS  --  12.9* 21.1*  --   --   --   --   HGB 10.9* 10.0* 10.3* 11.6* 9.2* 7.8* 9.7*  HCT 33.5* 30.1* 33.2* 34.0* 27.0* 25.1* 31.6*  MCV 79.6* 78.6* 80.2  --   --  81.5 82.5  PLT 392 395 471*  --   --  382 516*     Basic Metabolic Panel: Recent Labs  Lab 06/14/22 0940 06/16/22 0157 06/17/22 0226 06/17/22 1003 06/17/22 2034 06/18/22 0035  NA 134* 133* 136 135 134* 133*  K 3.8 4.7 4.2 4.4 4.6 4.5  CL 97* 94* 94*  --   --  93*  CO2 31 29 33*  --   --  32  GLUCOSE 112* 97 122*  --   --  76  BUN $Re'7 10 11  'wyO$ --   --  18  CREATININE 0.46* 0.46* 0.48*  --   --  0.44*  CALCIUM 8.7* 8.9 8.8*  --   --  7.7*  MG  --   --   --   --   --  2.0  PHOS  --   --    --   --   --  4.3    GFR: Estimated Creatinine Clearance: 85.1 mL/min (A) (by C-G formula based on SCr of 0.44 mg/dL (L)). Recent Labs  Lab 06/16/22 0157 06/17/22 0226 06/18/22 0035 06/18/22 0632  WBC 15.7* 23.7* 17.3* 17.7*  LATICACIDVEN  --  0.9  --   --      Liver Function Tests: Recent Labs  Lab 06/16/22 0157  AST 79*  ALT 115*  ALKPHOS 105  BILITOT 0.2*  PROT 6.8  ALBUMIN 2.2*    No results for input(s): "LIPASE", "AMYLASE" in the last 168 hours. Recent Labs  Lab 06/17/22 0226  AMMONIA 27     ABG    Component Value Date/Time   PHART 7.384 06/17/2022 2034   PCO2ART 62.8 (H) 06/17/2022 2034   PO2ART 71 (L) 06/17/2022 2034   HCO3 37.7 (H) 06/17/2022 2034   TCO2 40 (H) 06/17/2022 2034   O2SAT 94 06/17/2022 2034     Coagulation Profile: No results for input(s): "INR", "PROTIME" in the last 168 hours.   Cardiac Enzymes: No results for input(s): "CKTOTAL", "CKMB", "CKMBINDEX", "TROPONINI" in the last 168 hours.  HbA1C: No results found for: "HGBA1C"  CBG: Recent Labs  Lab 06/19/22 1600 06/19/22 1936 06/20/22 0009 06/20/22 0351 06/20/22 0755  GLUCAP 95 85 100* 117* 87       Signed,  Baldwin Jamaica, MS4

## 2022-06-20 NOTE — Progress Notes (Signed)
SATURATION QUALIFICATIONS: (This note is used to comply with regulatory documentation for home oxygen)  Patient Saturations on Room Air at Rest = 88%  Patient Saturations on Room Air while Ambulating = 84%  Patient Saturations on 3 Liters of oxygen while Ambulating = 90%  Please briefly explain why patient needs home oxygen: Patient has recurrent aspiration requiring continuous oxygen needs. This RN performed the 6 minute ambulatory test. Patient immediately desaturated when standing on room air to 84% and required 3L to maintain a saturation of 90% while ambulating.

## 2022-06-20 NOTE — Procedures (Signed)
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 for pharyngeal abscess   Depression    Diverticulitis    Drug abuse (HCC)    Hepatitis C    MRSA (methicillin resistant staph aureus) culture positive    Pharyngeal abscess 12/2021   Past Surgical History:  Past Surgical History:  Procedure Laterality Date   COLONOSCOPY     I & D EXTREMITY  11/10/2011   Procedure: IRRIGATION AND DEBRIDEMENT EXTREMITY;  Surgeon: Kathryne Hitch;  Location: [facility] OR;  Service: Orthopedics;  Laterality: Right;  with volar compartment release   I & D EXTREMITY  12/24/2011   Procedure: IRRIGATION AND DEBRIDEMENT EXTREMITY;  Surgeon: Eldred Manges;  Location: [facility] OR;  Service: Orthopedics;  Laterality: Right;   IR GASTROSTOMY TUBE MOD SED  06/13/2022   HPI: Pt is a 41 y.o. male who presented to the ED with cough, sputum production, and shortness of breath. Patient reported on admission that he did have pneumonia in April and was hospitalized for almost 2 and half weeks and subsequently was admitted for 4 days again. On last discharge from the hospital he was prescribed 3 L of oxygen but he was unable to bring the tank during travel to [employer], [place].  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 Pt lost 47 pounds since initial hospitalization in April and per January, 2023 notes in Care Everywhere, he had lost 50lbs in the  preceding 6 months. Pt has declined enteral nutrition from multiple providers including G-tube placement. Pt s/p I&D of neck abscess on 02/07/22 by ENT in [place]. Laryngoscopy during ENT visit on 02/21/22: Bilaterally effaced pyriform sinuses without masses or lesions. Pt started on antibiotics and Decadron to reduce inflammation of his throat; END suspected that this may be affected by severe acid reflux. MBS 04/17/22 at [facility]: severe to profound oropharyngeal dysphagia with interlabial escape, posterior escape of greater than half of bolus, repetitive/disorganized tongue motion, diffused oral residue , delayed swallow initiation, escape of bolus to nasopharynx, reduced anterior excursion and laryngeal elevation, reduced laryngeal vestibule closure, diminished pharyngeal stripping, and reduced tongue base retraction. Impairments led to a copious oropharyngeal residue and penetration and aspiration during and after the swallow of all consistencies. Laryngeal sensation was impaired and chin tuck, head tilt, supraglottic swallow, and effortful double swallows were ineffective.  MBS this admission 6/19 revealed pharyngeal dysphagia with recommendations for D2/NTL, which pt refused, continuing on regular texture diet despite known risk.  PEG placed 6/21.  Presumed aspiration event 6/23 postponing discharge.  Second RR event 6/25 requiring intubation, pt transferred to higher level of care.  ETT 6/25-6/27.  PMH: polysubstance abuse/opiate dependence on suboxone, active smoker, anxiety, depression, hepatitis C.   Subjective: cooperative, impulsive    Recommendations for follow up therapy are one component of a multi-disciplinary discharge planning process, led by the attending physician.  Recommendations may be updated  based on patient status, additional functional criteria and insurance authorization.  Assessment / Plan / Recommendation     06/20/2022   11:48 AM  Clinical Impressions   Clinical Impression Pt presents with a severe pharyngeal dysphagia c/b decreased based of tongue retraction, incomplete laryngeal closure, reduced hyolaryngeal elevation and excursion inferred from prolonged and incomplete white out phase, reduced pharyngeal constriction, suspected decreased UES opening, and diminished sensation.  These deficits resulted in silent aspiration of all consistencies trialed.  Penetration and aspiration noted to occur during swallow, but could be cleared with cued cough; however, his significant amount of pharyngeal residue resulted in further post prandial aspiration, which frequently occured 2/2 bolus flow through interarytenoid space.  Post prandial aspiration included solid residue, which had been located in pharyngeal spaces, migrating to upper airway with use of liquid wash or with repeat swallows.  It is suspected that pt is aspirating secretions at baseline given severity of pharyngeal dysphagia seen during this study and pt's frequent wet vocal quality.  Pt's cough strength is relatively strong and he was able to clear aspiration well after the event, including bring up a piece of peach which he had apparently aspirated during the swallow unvisualized on scope several minutes after peach trials were discontinued.  Although cough strength is protective, given that pt is unable to pharyngeally clear residue, it is of little benefit given consistent, unsensed postprandial aspiration of residue immediately following cough. Pt did not benefit from chin tuck.  Multiple swallows did not clear residue.  Alternating liquid and heavier solid boluses of varying consistencies was not beneficial.  Cup versus straw presentation did not prevent penetration or aspiration.  Pt is adamant about continuing to eat and drink by mouth.  Pt is not safe for any oral diet, but with this in mind there is no benefit of modifying diet textures. Thickened liquids increase pharyngeal residuals and are more  difficulty to clear.  Penetration and aspiration occured equally across all solid textures.  Discused results of study with pt.  Suggested that he may be a good candidate for swallowing therapy, which like any exercise program will take 6-8 weeks to see results, but to give him his best chance to rehabilitate his swallow, recommend pt remain NPO during that time to protect his airway as much as possible.  Pt is willing to complete swallow exercise program, but will continue PO intake throughout.  He states he will leave AMA if he is kept NPO. Discussed with MD with plans to proceed to discussion around goals of care.    Recommend pt remain NPO with alternate means of nutrition, hydration, and medication.  There is no safe oral diet.  If pt continues PO intake with known high risk of aspiration, recommend regular texture diet with thin liquid.    SLP Visit Diagnosis Dysphagia, pharyngeal phase (R13.13)  Impact on safety and function Severe aspiration risk         06/20/2022   11:48 AM  Treatment Recommendations  Treatment Recommendations Therapy as outlined in treatment plan below        06/20/2022   12:07 PM  Prognosis  Prognosis for Safe Diet Advancement Guarded  Barriers to Reach Goals Motivation;Severity of deficits;Behavior       06/20/2022   11:48 AM  Diet Recommendations  SLP Diet Recommendations NPO;Regular solids;Thin liquid  Liquid Administration via Cup  Medication Administration Via alternative means  Compensations Slow rate;Small sips/bites;Effortful swallow         06/20/2022  11:48 AM  Other Recommendations  Oral Care Recommendations Oral care QID  Follow Up Recommendations Outpatient SLP  Assistance recommended at discharge Frequent or constant Supervision/Assistance  Functional Status Assessment Patient has had a recent decline in their functional status and demonstrates the ability to make significant improvements in function in a reasonable and predictable  amount of time.       06/20/2022   11:48 AM  Frequency and Duration   Speech Therapy Frequency (ACUTE ONLY) min 2x/week  Treatment Duration 2 weeks         06/20/2022   11:40 AM  Oral Phase  Oral Phase WFL  Oral - Honey Cup WFL  Oral - Nectar Cup WFL  Oral - Nectar Straw WFL  Oral - Thin Cup [facility]  Oral - Thin Straw WFL  Oral - Puree WFL  Oral - Mech Soft WFL  Oral - Regular [facility]       06/20/2022   11:41 AM  Pharyngeal Phase  Pharyngeal Phase Impaired  Pharyngeal- Honey Cup Reduced pharyngeal peristalsis;Reduced laryngeal elevation;Reduced airway/laryngeal closure;Reduced tongue base retraction;Penetration/Aspiration during swallow;Penetration/Apiration after swallow;Moderate aspiration;Pharyngeal residue - valleculae;Pharyngeal residue - pyriform;Inter-arytenoid space residue  Pharyngeal Material enters airway, passes BELOW cords without attempt by patient to eject out (silent aspiration)  Pharyngeal- Nectar Cup Reduced pharyngeal peristalsis;Reduced laryngeal elevation;Reduced airway/laryngeal closure;Reduced tongue base retraction;Penetration/Aspiration during swallow;Penetration/Apiration after swallow;Moderate aspiration;Pharyngeal residue - valleculae;Pharyngeal residue - pyriform;Inter-arytenoid space residue  Pharyngeal Material enters airway, passes BELOW cords without attempt by patient to eject out (silent aspiration)  Pharyngeal- Nectar Straw Reduced pharyngeal peristalsis;Reduced laryngeal elevation;Reduced airway/laryngeal closure;Reduced tongue base retraction;Penetration/Aspiration during swallow;Penetration/Apiration after swallow;Moderate aspiration;Pharyngeal residue - valleculae;Pharyngeal residue - pyriform;Inter-arytenoid space residue  Pharyngeal Material enters airway, passes BELOW cords without attempt by patient to eject out (silent aspiration)  Pharyngeal- Thin Cup Reduced pharyngeal peristalsis;Reduced laryngeal elevation;Reduced airway/laryngeal  closure;Reduced tongue base retraction;Penetration/Aspiration during swallow;Penetration/Apiration after swallow;Moderate aspiration;Pharyngeal residue - valleculae;Pharyngeal residue - pyriform;Inter-arytenoid space residue  Pharyngeal Material enters airway, passes BELOW cords without attempt by patient to eject out (silent aspiration)  Pharyngeal- Thin Straw Reduced pharyngeal peristalsis;Reduced laryngeal elevation;Reduced airway/laryngeal closure;Reduced tongue base retraction;Penetration/Apiration after swallow;Penetration/Aspiration during swallow;Moderate aspiration;Pharyngeal residue - valleculae;Pharyngeal residue - pyriform;Inter-arytenoid space residue  Pharyngeal Material enters airway, passes BELOW cords without attempt by patient to eject out (silent aspiration)  Pharyngeal- Puree Reduced pharyngeal peristalsis;Reduced tongue base retraction;Reduced laryngeal elevation;Reduced airway/laryngeal closure;Penetration/Aspiration during swallow;Penetration/Apiration after swallow;Moderate aspiration;Pharyngeal residue - pyriform;Pharyngeal residue - valleculae;Inter-arytenoid space residue  Pharyngeal Material enters airway, passes BELOW cords without attempt by patient to eject out (silent aspiration)  Pharyngeal- Mechanical Soft Reduced pharyngeal peristalsis;Reduced laryngeal elevation;Reduced airway/laryngeal closure;Reduced tongue base retraction;Penetration/Aspiration during swallow;Penetration/Apiration after swallow;Significant aspiration (Amount);Pharyngeal residue - valleculae;Pharyngeal residue - pyriform  Pharyngeal Material enters airway, passes BELOW cords without attempt by patient to eject out (silent aspiration)  Pharyngeal- Regular Reduced pharyngeal peristalsis;Reduced laryngeal elevation;Reduced airway/laryngeal closure;Reduced tongue base retraction;Penetration/Apiration after swallow;Pharyngeal residue - valleculae;Pharyngeal residue - pyriform;Inter-arytenoid space residue   Pharyngeal Material enters airway, passes BELOW cords without attempt by patient to eject out (silent aspiration)        06/20/2022   11:47 AM  Cervical Esophageal Phase   Cervical Esophageal Phase Impaired  Puree Reduced cricopharyngeal relaxation     Kerrie Pleasure, MA, CCC-SLP Acute Rehabilitation Services Office: [phone] 06/20/2022, 12:08 PM

## 2022-06-20 NOTE — Progress Notes (Signed)
Patient educated on discharge instructions regarding PEG tube use, care and the reasoning for aspiration precautions. Patient was not receptive to teaching, and demanded a meal before leaving the hospital. He was offered his prescribed diet, but declined. He has also declined further care interventions from this nurse and other nursing staff pending his discharge. The patient was advised of the risks of refusing ordered nutrition, medication and treatments.   This RN, Consulting civil engineer and other unit staff members attempted multiple times to locate oxygen tanks sent by [employer] which were delivered to [facility] on 6/23. We reached out to case management for assistance with the patients need for oxygen during transport home.The tanks were not located, and patient has made arrangements for a friend to bring an oxygen tank from home.

## 2022-06-20 NOTE — Evaluation (Signed)
Physical Therapy Evaluation Patient Details Name: [name] MRN: [id number] DOB: May 23, 1981 Today's Date: 06/20/2022  History of Present Illness  The pt is a 41 yo male presenting 6/15 with SOB. Found to have LUL PNA with hypoxia. PEG placed 6/21. Intubated 6/25-6/27 due to chage in mental status with increased O2 needs.  PMH includes: polysubstance abuse in remission, and recent hospitalization for PNA.   Clinical Impression  Pt in bed upon arrival of PT, agreeable to evaluation at this time. Prior to admission the pt was independent with all mobility without need for DME, but reports significant limitations in endurance due to respiratory issues. The pt now presents with limitations in functional mobility, ROM, strength, endurance, and stability due to above dx, and will continue to benefit from skilled PT to address these deficits. The pt was able to complete sit-stand transfers without assist or UE support, but demos poor functional endurance and requires 2-3 min of seated rest to recover after x5 repeated sit-stands. The pt also completed ~138ft hallway ambulation with minG and BUE support on IV pole. The pt maintained SpO2 in 90s on 3L with gait but had short drop (~10 seconds) to 88% after exertion requiring continued seated rest to recover. Will continue to benefit from skilled PT acutely and after d/c to maximize functional independence and improved endurance.         Recommendations for follow up therapy are one component of a multi-disciplinary discharge planning process, led by the attending physician.  Recommendations may be updated based on patient status, additional functional criteria and insurance authorization.  Follow Up Recommendations Home health PT      Assistance Recommended at Discharge Intermittent Supervision/Assistance  Patient can return home with the following  Assistance with cooking/housework;Direct supervision/assist for medications management;Assist for  transportation;Help with stairs or ramp for entrance    Equipment Recommendations Rolling walker (2 wheels) (shower seat)  Recommendations for Other Services       Functional Status Assessment Patient has had a recent decline in their functional status and demonstrates the ability to make significant improvements in function in a reasonable and predictable amount of time.     Precautions / Restrictions Precautions Precautions: Fall Precaution Comments: on 3L O2 at baseline, cortrak Restrictions Weight Bearing Restrictions: No      Mobility  Bed Mobility Overal bed mobility: Modified Independent             General bed mobility comments: no assist, HOB elevated but pt could likely complete without HOB elevated    Transfers Overall transfer level: Needs assistance Equipment used: None Transfers: Sit to/from Stand Sit to Stand: Min guard           General transfer comment: pt able to rise from bed without assist, minG for safety.pt then reaching for BUE support on IV pole for balance wtih gait    Ambulation/Gait Ambulation/Gait assistance: Min assist, Min guard Gait Distance (Feet): 180 Feet Assistive device: IV Pole Gait Pattern/deviations: Step-through pattern, Decreased stride length, Decreased dorsiflexion - right, Decreased dorsiflexion - left, Shuffle Gait velocity: decreased Gait velocity interpretation: <1.31 ft/sec, indicative of household ambulator   General Gait Details: pt with small steps, toe walks, single standing rest break around 100 ft, SpO2 remained >90% during gait. brief drop to 88% on 3L after ambulation     Balance Overall balance assessment: Mild deficits observed, not formally tested  Pertinent Vitals/Pain Pain Assessment Pain Assessment: No/denies pain Pain Intervention(s): Monitored during session    Home Living Family/patient expects to be discharged to:: Private  residence Living Arrangements: Non-relatives/Friends Available Help at Discharge: Friend(s);Available 24 hours/day Type of Home: House Home Access: Level entry       Home Layout: One level Home Equipment: None Additional Comments: pt states he just moved from [place] to [place] and will be staying with friends, reports friends are home during the day and could assist at d/c    Prior Function Prior Level of Function : Independent/Modified Independent             Mobility Comments: pt reports independence without need for DME, no falls       Hand Dominance        Extremity/Trunk Assessment   Upper Extremity Assessment Upper Extremity Assessment: Generalized weakness    Lower Extremity Assessment Lower Extremity Assessment: Generalized weakness (bilateral limited ROM in ankle, toe-walks which pt states is his normal. poor endurance)    Cervical / Trunk Assessment Cervical / Trunk Assessment: Kyphotic (no muscle bulk)  Communication   Communication: No difficulties  Cognition Arousal/Alertness: Awake/alert Behavior During Therapy: Impulsive, Restless Overall Cognitive Status: No family/caregiver present to determine baseline cognitive functioning                                 General Comments: pt perserverating on getting food. Able to answer al questions but seems to have poor insight to condition and importance of tube feeds at this time.        General Comments General comments (skin integrity, edema, etc.): SpO2 stable in 90s on 3L with gait, but had short drops to low of 88% after ambulation, then needed increased time and seated rest to recover. low of 85% after 5x sit-stand but again recovered to 90s in 5-10 seconds    Exercises Other Exercises Other Exercises: 5x sit-stand from recliner without use of UE. 13.75 sec   Assessment/Plan    PT Assessment Patient needs continued PT services  PT Problem List Decreased strength;Decreased activity  tolerance;Decreased balance;Cardiopulmonary status limiting activity       PT Treatment Interventions DME instruction;Gait training;Stair training;Functional mobility training;Therapeutic activities;Therapeutic exercise;Balance training;Patient/family education    PT Goals (Current goals can be found in the Care Plan section)  Acute Rehab PT Goals Patient Stated Goal: to eat food PT Goal Formulation: With patient Time For Goal Achievement: 07/04/22 Potential to Achieve Goals: Good    Frequency Min 3X/week        AM-PAC PT "6 Clicks" Mobility  Outcome Measure Help needed turning from your back to your side while in a flat bed without using bedrails?: None Help needed moving from lying on your back to sitting on the side of a flat bed without using bedrails?: A Little Help needed moving to and from a bed to a chair (including a wheelchair)?: A Little Help needed standing up from a chair using your arms (e.g., wheelchair or bedside chair)?: A Little Help needed to walk in hospital room?: A Little Help needed climbing 3-5 steps with a railing? : A Little 6 Click Score: 19    End of Session Equipment Utilized During Treatment: Oxygen Activity Tolerance: Patient tolerated treatment well;Patient limited by fatigue Patient left: in chair;with call bell/phone within reach Nurse Communication: Mobility status PT Visit Diagnosis: Other abnormalities of gait and mobility (R26.89);Muscle weakness (generalized) (M62.81)  [id number] PT Time Calculation (min) (ACUTE ONLY): 19 min   Charges:   PT Evaluation $PT Eval Moderate Complexity: 1 Mod          West Carbo, PT, DPT   Acute Rehabilitation Department  Sandra Cockayne 06/20/2022, 9:48 AM

## 2022-06-20 NOTE — ACP (Advance Care Planning) (Addendum)
  Interdisciplinary Goals of Care Family Meeting   Date carried out: 06/20/2022  Location of the meeting: Bedside  Member's involved: Physician, Bedside Registered Nurse, Social Worker, and Other: SLP  Durable Power of Insurance risk surveyor: None    Discussion: We discussed goals of care for [employer] .  Patient continues to insist to eat when unsafe due to aspiration and not follow prescribed diet. He is aware that if he continues to do so he will continue to aspirate. He stated that he would rather die than not eat. I suggested that it would be inappropriate to continue to offer aggressive care when is wantonly disregarding life saving instructions and that every episode of aspiration will make him progressively weaker. He has agreed to DNR/DNI status  Code status: Full DNR  Disposition: Home  Time spent for the meeting: 20 min  MOSLT form complete and scanned.     Lynnell Catalan, MD  06/20/2022, 11:42 AM

## 2022-06-20 NOTE — Progress Notes (Signed)
Patient educated on discharge instructions and provided with detailed instructions on PEG tube use at home. Patient gave PM feeding and asked appropriate questions involving PEG tube care. Supplies give to patient for at home use prior to arrival of medical supplies.   1800: Patient found in room rummaging through bags prior to discharge and eating old pretzels. This RN educated patient on reasoning for recommendations of only tube feedings prescribed due to high risk of aspiration. Patient stated he was going to continue to eat at home regardless of recommendations.

## 2022-06-20 NOTE — Plan of Care (Signed)
  Problem: Activity: Goal: Risk for activity intolerance will decrease Outcome: Progressing   Problem: Activity: Goal: Ability to tolerate increased activity will improve Outcome: Progressing   Problem: Nutrition: Goal: Adequate nutrition will be maintained Outcome: Not Progressing

## 2022-06-20 NOTE — TOC Transition Note (Signed)
Transition of Care [facility]) - CM/SW Discharge Note   Patient Details  Name: [name] MRN: [id number] Date of Birth: 05-12-1981  Transition of [facility] CM/SW Contact:  Harriet Masson, RN Phone Number: 06/20/2022, 1:41 PM   Clinical Narrative:    Spoke to patient regarding transition needs. Patient going home with tube feeds. Orders for tube feeds, oxygen, walker. Spoke to Selah with [employer] and ordered charity tube feeds and home 02. Spoke to Niue with [employer] and ordered charity walker. Spoke to Zoar with [facility] home health and patient was declined for home health due to drug history. Patient agreeable to out patient rehab. Referral sent to outpt rehab [place]. Patient agreeable for CMA to make PCP apt. Apt information on AVS. MATCH letter done. Patient states he can afford the copays. Patient states he will take an uber to apts. Patient states his friend can take him home today.  Address, Phone number   Final next level of care: OP Rehab Barriers to Discharge: Barriers Resolved   Patient Goals and CMS Choice Patient states their goals for this hospitalization and ongoing recovery are:: return home CMS Medicare.gov Compare Post Acute Care list provided to:: Patient Choice offered to / list presented to : Patient  Discharge Placement               home        Discharge Plan and Services   Discharge Planning Services: CM Consult Post Acute Care Choice: Durable Medical Equipment          DME Arranged: Oxygen DME Agency: [employer] Date DME Agency Contacted: 06/20/22 Time DME Agency Contacted: 1339 Representative spoke with at DME Agency: Ian Malkin HH Arranged: RN HH Agency: [facility] Date [facility] Agency Contacted: 06/14/22 Time HH Agency Contacted: [phone] Representative spoke with at [facility] Agency: Kandee Keen  Social Determinants of Health (SDOH) Interventions     Readmission Risk Interventions    06/20/2022    1:40 PM  Readmission Risk Prevention Plan   Transportation Screening Complete  Medication Review Oceanographer) Complete  PCP or Specialist appointment within 3-5 days of discharge Complete  HRI or Home Care Consult Complete  SW Recovery Care/Counseling Consult Complete  Palliative Care Screening Not Applicable  Skilled Nursing Facility Not Applicable

## 2022-06-20 NOTE — Care Management (Signed)
ED RNCM received call from [place] RN on [facility] concerning patient still waiting for oxygen delivery for discharge home.  Zack Blank Supervisor  from [employer], who states that 4 E-tanks were delivered to patient's old room  on [address], along with 6 tanks that were delivered to patient's home.  Updated Harriet Pho RN on 2H.

## 2022-06-20 NOTE — Evaluation (Addendum)
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 Patient reported on admission that he did have pneumonia in April and was hospitalized for almost 2 and half weeks and subsequently was admitted for 4 days again. On last discharge from the hospital he was prescribed 3 L of oxygen but he was unable to bring the tank during travel to [employer], [place].  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 Kurt lost 47 pounds since initial hospitalization in April and per January, 2023 notes in Care Everywhere, he had lost 50lbs in the preceding 6 months. Kurt has declined enteral nutrition from multiple  providers including G-tube placement. Kurt s/p I&D of neck abscess on 02/07/22 by ENT in [place]. Laryngoscopy during ENT visit on 02/21/22: Bilaterally effaced pyriform sinuses without masses or lesions. Kurt started on antibiotics and Decadron to reduce inflammation of his throat; END suspected that this may be affected by severe acid reflux. MBS 04/17/22 at [facility]: severe to profound oropharyngeal dysphagia with interlabial escape, posterior escape of greater than half of bolus, repetitive/disorganized tongue motion, diffused oral residue , delayed swallow initiation, escape of bolus to nasopharynx, reduced anterior excursion and laryngeal elevation, reduced laryngeal vestibule closure, diminished pharyngeal stripping, and reduced tongue base retraction. Impairments led to a copious oropharyngeal residue and penetration and aspiration during and after the swallow of all consistencies. Laryngeal sensation was impaired and chin tuck, head tilt, supraglottic swallow, and effortful double swallows were ineffective.  MBS this admission 6/19 revealed pharyngeal dysphagia with recommendations for D2/NTL, which Kurt refused, continuing on regular texture diet despite known risk.  PEG placed 6/21.  Presumed aspiration event 6/23 postponing discharge.  Second RR event 6/25 requiring intubation, Kurt transferred to higher level of care.  PMH: polysubstance abuse/opiate dependence on suboxone, active smoker, anxiety, depression, hepatitis C.    Assessment / Plan / Recommendation  Clinical Impression  Kurt seen for re-evaluation in setting of hx of known dysphagia following with recent VDRF requiring 2 day intubation. Kurt presents with clinical indicators of pharyngeal dysphagia.   Kurt with hoarse vocal quality  now at beside.  There was intermittent coughing and throat clearing with thin liquid, puree, and regular texture solids.  Kurt initially took very large bites and but began to modify his behavior after a few  trials.  Kurt used effortful swallows and Mendelssohn maneuver independently with intake.  Kurt is adamant that he wants to continue eating and drinking, even after acknowledging that it may lead to death.  Intially, Kurt declined diet modifications except maybe softer foods.  Discussed with Kurt what swallowing rehabilitation might look like and that he has good potential to participate in swallow therapy. Kurt agreeable to trial of nectar thick liquids and said he could do it, as long as he was allowed to continue to eat and drink. As Kurt is now considering following recommendations for modified diet, will proceed with FEES to reassess swallow function following intubation.  SLP Visit Diagnosis: Dysphagia, pharyngeal phase (R13.13)    Aspiration Risk  Severe aspiration risk    Diet Recommendation  (Pending FEES results)   Medication Administration: Crushed with puree Supervision: Patient able to self feed    Other  Recommendations Oral Care Recommendations: Oral care BID    Recommendations for follow up therapy are one component of a multi-disciplinary discharge planning process, led by the attending physician.  Recommendations may be updated based on patient status, additional functional criteria and insurance authorization.  Follow up Recommendations Outpatient SLP      Assistance Recommended at Discharge Frequent or constant Supervision/Assistance  Functional Status Assessment Patient has had a recent decline in their functional status and demonstrates the ability to make significant improvements in function in a reasonable and predictable amount of time.  Frequency and Duration  (Pending FEES results)          Prognosis Prognosis for Safe Diet Advancement: Guarded Barriers to Reach Goals: Motivation;Severity of deficits;Behavior      Swallow Study   General HPI: [name] who presented to the ED with cough, sputum production, and shortness of breath. Patient reported on  admission that he did have pneumonia in April and was hospitalized for almost 2 and half weeks and subsequently was admitted for 4 days again. On last discharge from the hospital he was prescribed 3 L of oxygen but he was unable to bring the tank during travel to [place] [employer], [place].  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 Kurt lost 47 pounds since initial hospitalization in April and per January, 2023 notes in Care Everywhere, he had lost 50lbs in the preceding 6 months. Kurt has declined enteral nutrition from multiple providers including G-tube placement. Kurt s/p I&D of neck abscess on 02/07/22 by ENT in [place]. Laryngoscopy during ENT visit on 02/21/22: Bilaterally effaced pyriform sinuses without masses or lesions. Kurt started on antibiotics and Decadron to reduce inflammation of his throat; END suspected that this may be affected by severe acid reflux. MBS 04/17/22 at [facility]: severe to profound oropharyngeal dysphagia with interlabial escape, posterior escape of greater than half of bolus, repetitive/disorganized tongue motion, diffused oral residue , delayed swallow initiation, escape of bolus to nasopharynx, reduced anterior excursion and laryngeal elevation, reduced laryngeal vestibule closure, diminished pharyngeal stripping, and reduced tongue base retraction. Impairments led to a copious oropharyngeal residue and penetration and aspiration during and after the swallow of all consistencies. Laryngeal sensation was impaired and chin tuck, head tilt,  supraglottic swallow, and effortful double swallows were ineffective.  MBS this admission 6/19 revealed pharyngeal dysphagia with recommendations for D2/NTL, which Kurt refused, continuing on regular texture diet despite known risk.  PEG placed 6/21.  Presumed aspiration event 6/23 postponing discharge.  Second RR event 6/25  requiring intubation, Kurt transferred to higher level of care.  PMH: polysubstance abuse/opiate dependence on suboxone, active smoker, anxiety, depression, hepatitis C. Type of Study: Bedside Swallow Evaluation Previous Swallow Assessment: MBS earlier this admission Diet Prior to this Study: NPO Temperature Spikes Noted: No Respiratory Status: Nasal cannula History of Recent Intubation: Yes Length of Intubations (days): 2 days Date extubated: 06/19/22 Behavior/Cognition: Alert;Cooperative;Pleasant mood Oral Cavity Assessment: Within Functional Limits Oral Care Completed by SLP: No Oral Cavity - Dentition: Dentures, top;Dentures, bottom Vision: Functional for self-feeding Self-Feeding Abilities: Able to feed self Patient Positioning: Upright in chair Baseline Vocal Quality: Wet;Hoarse Volitional Cough: Strong Volitional Swallow: Able to elicit (effortful)    Oral/Motor/Sensory Function Overall Oral Motor/Sensory Function: Within functional limits Facial ROM: Within Functional Limits Facial Symmetry: Within Functional Limits Lingual ROM: Within Functional Limits Lingual Symmetry: Within Functional Limits Lingual Strength: Within Functional Limits Velum: Within Functional Limits Mandible: Within Functional Limits   Ice Chips Ice chips: Not tested   Thin Liquid Thin Liquid: Impaired Presentation: Cup;Self Fed Pharyngeal  Phase Impairments: Multiple swallows;Wet Vocal Quality;Throat Clearing - Immediate;Cough - Immediate    Nectar Thick Nectar Thick Liquid: Impaired Presentation: Cup;Self Fed Pharyngeal Phase Impairments: Suspected delayed Swallow   Honey Thick Honey Thick Liquid: Not tested   Puree Puree: Impaired Presentation: Self Fed Pharyngeal Phase Impairments: Throat Clearing - Immediate;Throat Clearing - Delayed   Solid     Solid: Impaired Pharyngeal Phase Impairments: Throat Clearing - Immediate;Throat Clearing - Delayed      Kerrie Pleasure, MA, CCC-SLP Acute  Rehabilitation Services Office: [phone] 06/20/2022,10:12 AM

## 2022-06-25 ENCOUNTER — Ambulatory Visit: Payer: Medicaid - Out of State | Admitting: Student
